# Patient Record
Sex: Female | Born: 1950 | Race: Black or African American | Hispanic: No | Smoking: Never smoker
Health system: Southern US, Community
[De-identification: ages and names within clinical notes are randomized; demographics above are authoritative.]

## PROBLEM LIST (undated history)

## (undated) DIAGNOSIS — T7840XA Allergy, unspecified, initial encounter: Secondary | ICD-10-CM

## (undated) DIAGNOSIS — F329 Major depressive disorder, single episode, unspecified: Secondary | ICD-10-CM

## (undated) DIAGNOSIS — M1990 Unspecified osteoarthritis, unspecified site: Secondary | ICD-10-CM

## (undated) DIAGNOSIS — E785 Hyperlipidemia, unspecified: Secondary | ICD-10-CM

## (undated) DIAGNOSIS — F1910 Other psychoactive substance abuse, uncomplicated: Secondary | ICD-10-CM

## (undated) DIAGNOSIS — I10 Essential (primary) hypertension: Secondary | ICD-10-CM

## (undated) DIAGNOSIS — K219 Gastro-esophageal reflux disease without esophagitis: Secondary | ICD-10-CM

## (undated) DIAGNOSIS — F32A Depression, unspecified: Secondary | ICD-10-CM

## (undated) HISTORY — DX: Other psychoactive substance abuse, uncomplicated: F19.10

## (undated) HISTORY — DX: Hyperlipidemia, unspecified: E78.5

## (undated) HISTORY — DX: Unspecified osteoarthritis, unspecified site: M19.90

## (undated) HISTORY — PX: TONSILLECTOMY: SUR1361

## (undated) HISTORY — DX: Gastro-esophageal reflux disease without esophagitis: K21.9

## (undated) HISTORY — DX: Depression, unspecified: F32.A

## (undated) HISTORY — DX: Allergy, unspecified, initial encounter: T78.40XA

## (undated) HISTORY — DX: Major depressive disorder, single episode, unspecified: F32.9

---

## 1994-12-12 HISTORY — PX: OTHER SURGICAL HISTORY: SHX169

## 2002-07-08 ENCOUNTER — Inpatient Hospital Stay (HOSPITAL_COMMUNITY): Admission: EM | Admit: 2002-07-08 | Discharge: 2002-07-11 | Payer: Self-pay | Admitting: Emergency Medicine

## 2002-07-08 ENCOUNTER — Encounter: Payer: Self-pay | Admitting: Internal Medicine

## 2003-04-10 ENCOUNTER — Encounter: Payer: Self-pay | Admitting: Emergency Medicine

## 2003-04-10 ENCOUNTER — Inpatient Hospital Stay (HOSPITAL_COMMUNITY): Admission: EM | Admit: 2003-04-10 | Discharge: 2003-04-16 | Payer: Self-pay | Admitting: Emergency Medicine

## 2003-05-18 ENCOUNTER — Encounter: Payer: Self-pay | Admitting: Emergency Medicine

## 2003-05-18 ENCOUNTER — Emergency Department (HOSPITAL_COMMUNITY): Admission: EM | Admit: 2003-05-18 | Discharge: 2003-05-18 | Payer: Self-pay | Admitting: Emergency Medicine

## 2003-06-19 ENCOUNTER — Ambulatory Visit (HOSPITAL_COMMUNITY): Admission: RE | Admit: 2003-06-19 | Discharge: 2003-06-19 | Payer: Self-pay | Admitting: Family Medicine

## 2003-06-25 ENCOUNTER — Ambulatory Visit (HOSPITAL_COMMUNITY): Admission: RE | Admit: 2003-06-25 | Discharge: 2003-06-25 | Payer: Self-pay | Admitting: Family Medicine

## 2004-02-26 ENCOUNTER — Emergency Department (HOSPITAL_COMMUNITY): Admission: EM | Admit: 2004-02-26 | Discharge: 2004-02-26 | Payer: Self-pay | Admitting: Internal Medicine

## 2006-06-22 ENCOUNTER — Emergency Department (HOSPITAL_COMMUNITY): Admission: EM | Admit: 2006-06-22 | Discharge: 2006-06-22 | Payer: Self-pay | Admitting: Emergency Medicine

## 2006-12-11 ENCOUNTER — Inpatient Hospital Stay (HOSPITAL_COMMUNITY): Admission: EM | Admit: 2006-12-11 | Discharge: 2006-12-14 | Payer: Self-pay | Admitting: Emergency Medicine

## 2006-12-30 ENCOUNTER — Emergency Department (HOSPITAL_COMMUNITY): Admission: EM | Admit: 2006-12-30 | Discharge: 2006-12-31 | Payer: Self-pay | Admitting: Emergency Medicine

## 2008-04-23 ENCOUNTER — Emergency Department (HOSPITAL_COMMUNITY): Admission: EM | Admit: 2008-04-23 | Discharge: 2008-04-23 | Payer: Self-pay | Admitting: Emergency Medicine

## 2008-10-19 ENCOUNTER — Emergency Department (HOSPITAL_COMMUNITY): Admission: EM | Admit: 2008-10-19 | Discharge: 2008-10-19 | Payer: Self-pay | Admitting: Emergency Medicine

## 2008-11-10 ENCOUNTER — Emergency Department (HOSPITAL_COMMUNITY): Admission: EM | Admit: 2008-11-10 | Discharge: 2008-11-10 | Payer: Self-pay | Admitting: Emergency Medicine

## 2009-07-16 ENCOUNTER — Ambulatory Visit (HOSPITAL_COMMUNITY): Admission: RE | Admit: 2009-07-16 | Discharge: 2009-07-16 | Payer: Self-pay | Admitting: Internal Medicine

## 2009-07-21 ENCOUNTER — Encounter: Payer: Self-pay | Admitting: Internal Medicine

## 2009-07-23 ENCOUNTER — Telehealth (INDEPENDENT_AMBULATORY_CARE_PROVIDER_SITE_OTHER): Payer: Self-pay

## 2009-07-27 ENCOUNTER — Encounter: Payer: Self-pay | Admitting: Internal Medicine

## 2009-07-27 ENCOUNTER — Ambulatory Visit: Payer: Self-pay | Admitting: Internal Medicine

## 2009-07-27 ENCOUNTER — Ambulatory Visit (HOSPITAL_COMMUNITY): Admission: RE | Admit: 2009-07-27 | Discharge: 2009-07-27 | Payer: Self-pay | Admitting: Internal Medicine

## 2009-07-28 ENCOUNTER — Encounter: Payer: Self-pay | Admitting: Internal Medicine

## 2009-10-15 ENCOUNTER — Encounter: Payer: Self-pay | Admitting: Orthopedic Surgery

## 2009-10-15 ENCOUNTER — Ambulatory Visit (HOSPITAL_COMMUNITY): Admission: RE | Admit: 2009-10-15 | Discharge: 2009-10-15 | Payer: Self-pay | Admitting: Internal Medicine

## 2009-11-02 ENCOUNTER — Ambulatory Visit: Payer: Self-pay | Admitting: Orthopedic Surgery

## 2009-11-02 DIAGNOSIS — IMO0002 Reserved for concepts with insufficient information to code with codable children: Secondary | ICD-10-CM | POA: Insufficient documentation

## 2009-11-02 DIAGNOSIS — M1710 Unilateral primary osteoarthritis, unspecified knee: Secondary | ICD-10-CM

## 2009-11-17 ENCOUNTER — Other Ambulatory Visit: Admission: RE | Admit: 2009-11-17 | Discharge: 2009-11-17 | Payer: Self-pay | Admitting: Obstetrics & Gynecology

## 2010-10-14 ENCOUNTER — Ambulatory Visit (HOSPITAL_COMMUNITY): Admission: RE | Admit: 2010-10-14 | Discharge: 2010-10-14 | Payer: Self-pay | Admitting: Internal Medicine

## 2011-01-11 NOTE — Assessment & Plan Note (Signed)
Summary: RT KNEE EVAL/TREAT/HAD XRAY [facility] 10/15/09/REF Miranda Sampson/MEDICAID/CAF   Vital Signs:  Patient profile:   60 year old female Weight:      195 pounds Pulse rate:   78 / minute Resp:     16 per minute  Vitals Entered By: Fuller Canada MD (November 02, 2009 11:17 AM)  Visit Type:  Initial Consult Referring Miranda Sampson:  Dr. Felecia Shelling  CC:  knee pain.  History of Present Illness: I saw Miranda Sampson in the office today for an initial visit.  She is a 60 years old woman with the complaint of:  chief complaint:  right knee pain, referral Miranda Sampson.  pain -duration a year, fell over a year ago.  -location from whole knee to the right hip, no back pain.  -severity [1-10] 9  -worsened by: lying down or walking to the store.  -improved by:  Tylenol, helps some with Vicodin.  -quality: sharp pain.  -context: has swelling and stiffness of the knee, has popping, has catching and locking of the knee, has weakness of the knee. No injections or therapy.  -other symptoms has some numbness right thigh area too. No back surgery or ESI's.  -xrays done & where:  [facility] right knee 10/15/09.  Takes Glucotrol for Diabetes and Vicodin for pain.  Allergies (verified): No Known Drug Allergies  Past History:  Past Medical History: diabetes  Past Surgical History: stomach tumor removed, benign  Family History: Family History of Diabetes  Social History: Patient is widowed.  no job 10th grade education na for smoking, alcohol, and caffeine use  Review of Systems General:  Denies weight loss, weight gain, fever, chills, and fatigue. Cardiac :  Denies chest pain, angina, heart attack, heart failure, poor circulation, blood clots, and phlebitis. Resp:  Denies short of breath, difficulty breathing, COPD, cough, and pneumonia. GI:  Denies nausea, vomiting, diarrhea, constipation, difficulty swallowing, ulcers, GERD, and reflux. GU:  Denies kidney failure, kidney transplant, kidney stones, burning,  poor stream, testicular cancer, blood in urine, and . Neuro:  Denies headache, dizziness, migraines, numbness, weakness, tremor, and unsteady walking. MS:  Denies joint pain, rheumatoid arthritis, joint swelling, gout, bone cancer, osteoporosis, and . Endo:  Denies thyroid disease, goiter, and diabetes. Psych:  Denies depression, mood swings, anxiety, panic attack, bipolar, and schizophrenia. Derm:  Denies eczema, cancer, and itching. EENT:  Denies poor vision, cataracts, glaucoma, poor hearing, vertigo, ears ringing, sinusitis, hoarseness, toothaches, and bleeding gums. Immunology:  Denies seasonal allergies, sinus problems, and allergic to bee stings. Lymphatic:  Denies lymph node cancer and lymph edema.  Physical Exam  Additional Exam:  GEN: well developed and nourished. tall framedl body habitus, grooming and hygiene CDV: normal pulses perfusion color and temperature without swelling or edema Lymph: normal lymph system SKIN: normal without lesions, masses, nodules NEURO/PSYCH: Normal coordination, reflexes and sensation. awake alert and oriented   Ambulation, normal.   PPI:RJJOA knee  Inspection mild malalignment, medial joint line tenderness   ROM flexion 125  MOTOR 5/5  Stability: normal   Negative McMurray sign.  LEFT knee full range of motion, no tenderness, mild malalignment, motor exam. Grade 5 stability, normal. McMurray is negative    Impression & Recommendations:  Problem # 1:  KNEE, ARTHRITIS, DEGEN./OSTEO (CZY-606.30) Assessment New  RIGHT knee:  Verbal consent was obtained. The knee was prepped with alcohol and ethyl chloride. 1 cc of depomedrol 40mg /cc and 4 cc of lidocaine 1% was injected. there were no complications.   . Medial joint space narrowing moderate  consistent with osteoarthritis.  Recommend injections, Naprosyn, patient wanted something for pain as well. She was given Ultracet  Her updated medication list for this problem includes:     Naprosyn 500 Mg Tabs (Naproxen) .Marland Kitchen... 1 by mouth two times a day    Ultracet 37.5-325 Mg Tabs (Tramadol-acetaminophen) .Marland Kitchen... 1 by mouth q 4 as needed pain  Orders: New Patient Level III ([address]) Joint Aspirate / Injection, Large ([address]10) Depo- Medrol 40mg  (J1030)  Medications Added to Medication List This Visit: 1)  Naprosyn 500 Mg Tabs (Naproxen) .Marland Kitchen.. 1 by mouth two times a day 2)  Ultracet 37.5-325 Mg Tabs (Tramadol-acetaminophen) .Marland Kitchen.. 1 by mouth q 4 as needed pain  Patient Instructions: 1)  You have received an injection of cortisone today. You may experience increased pain at the injection site. Apply ice pack to the area for 20 minutes every 2 hours and take 2 xtra strength tylenol every 8 hours. This increased pain will usually resolve in 24 hours. The injection will take effect in 3-10 days.  2)  take arthritis medicne and ultracet for pain  3)  Please schedule a follow-up appointment as needed. Prescriptions: ULTRACET 37.5-325 MG TABS (TRAMADOL-ACETAMINOPHEN) 1 by mouth q 4 as needed pain  #90 x 3   Entered and Authorized by:   Fuller Canada MD   Signed by:   Fuller Canada MD on 11/02/2009   Method used:   Faxed to ...       [facility]* (retail)       [address]       [place], [place]  [address]       Ph: [phone]       Fax: [phone]   RxID:   [id number] NAPROSYN 500 MG TABS (NAPROXEN) 1 by mouth two times a day  #60 x 5   Entered and Authorized by:   Fuller Canada MD   Signed by:   Fuller Canada MD on 11/02/2009   Method used:   Faxed to ...       [facility] (retail)       [address]       [place], [place]  [address]       Ph: [phone]       Fax: [phone]   RxID:   [id number] ULTRACET 37.5-325 MG TABS (TRAMADOL-ACETAMINOPHEN) 1 by mouth q 4 as needed pain  #90 x 3   Entered and Authorized by:   Fuller Canada MD   Signed by:   Fuller Canada MD on  11/02/2009   Method used:   Print then Give to Patient   RxID:   [id number] NAPROSYN 500 MG TABS (NAPROXEN) 1 by mouth two times a day  #60 x 5   Entered and Authorized by:   Fuller Canada MD   Signed by:   Fuller Canada MD on 11/02/2009   Method used:   Print then Give to Patient   RxID:   [id number]

## 2011-01-11 NOTE — Letter (Signed)
Summary: Internal Other Domingo Dimes  Internal Other Domingo Dimes   Imported By: Cloria Spring LPN 16/09/9603 54:09:81  _____________________________________________________________________  External Attachment:    Type:   Image     Comment:   External Document

## 2011-01-11 NOTE — Progress Notes (Signed)
  Phone Note Call from Patient   Caller: Daughter Claris Che Summary of Call: T/C from daughter, Claris Che. She was asking could her Mom be admitted to the hospital night before the TCS because  she is diabetic. I explained to her that Dr. Jena Gauss has decreased her Glucovance to one tablet in the PM day before procedure,  instead of the usual two in the pm. I told her that her Mom could have lots of broths and jello ( she was saying that she wouldn't be eating) and for someone to be with her and make sure she did just that. I explained that lots of diabetics do the TCS and  do not have to be hospitalized. She voiced uncerstanding and agreed to look over all of the instructions and call back if  any further questions. Initial call taken by: Cloria Spring LPN,  July 23, 2009 9:43 AM

## 2011-01-11 NOTE — Letter (Signed)
Summary: History form  History form   Imported By: Jacklynn Ganong 11/03/2009 11:09:30  _____________________________________________________________________  External Attachment:    Type:   Image     Comment:   External Document

## 2011-01-11 NOTE — Letter (Signed)
Summary: Patient Notice, Colon Biopsy Results  [facility]  [address]   [place], [place] [address]   Phone: [phone]  Fax: [phone]       July 28, 2009   Miranda Sampson [address] [place], [place]  [address] 1951-06-11    Dear [name],  I am pleased to inform you that the biopsies taken during your recent colonoscopy did not show any evidence of cancer upon pathologic examination.  Additional information/recommendations:  Please call us if you are having persistent problems or have questions about your condition that have not been fully answered at this time.  Sincerely,    R. Roetta Sessions MD  [facility] Ph: [phone]    Fax: [phone]   Appended Document: Patient Notice, Colon Biopsy Results mailed pt letter

## 2011-03-19 LAB — GLUCOSE, CAPILLARY: Glucose-Capillary: 181 mg/dL — ABNORMAL HIGH (ref 70–99)

## 2011-04-26 NOTE — Op Note (Signed)
NAME:  Miranda Sampson, Miranda Sampson                   ACCOUNT NO.:  [id number]   MEDICAL RECORD NO.:  [id number]          PATIENT TYPE:  AMB   LOCATION:  DAY                           FACILITY:  [facility]   PHYSICIAN:  R. Roetta Sessions, M.D. DATE OF BIRTH:  03-02-1951   DATE OF PROCEDURE:  07/27/2009  DATE OF DISCHARGE:                               OPERATIVE REPORT   PROCEDURE PERFORMED:  Screening colonoscopy.   60 year old lady comes for colorectal cancer screening.  She underwent  attempted colonoscopy in 2004 for iron-deficiency by Dr. Karilyn Cota.  Poor  prep precluded complete examination and barium enema was recommended.  As far as I could tell, that was never done.   [name] also tells me that she had stomach surgery at [facility] back in  1997.  Could find no records corroborating this on [facility] data base.  However, there is mentioned indications for a CT back in 2009 for  abdominal pain.  This lady had colon surgery in 2002 for a tumor with  a blockage.  We have no records.  The colonoscopy is now being done as  screening maneuver.  She has no lower GI tract symptoms.  There ws no  family history of colon cancer or colon polyps as she reports.  Questions answered.  She is agreeable.   PROCEDURE NOTE:  O2 saturation, blood pressure, pulse respirations  monitored throughout entire procedure.   CONSCIOUS SEDATION:  Versed 4 mg IV, Demerol 75 mg IV in divided doses.   INSTRUMENT:  Pentax video chip system.   FINDINGS:  Digital rectal exam revealed no abnormalities.   ENDOSCOPIC FINDINGS:  Colon prep was adequate.  Colon:  Colonic mucosa was surveyed from the rectosigmoid junction all  the way up to a blind end of the colon with an adjacent surgical  anastomosis with small-bowel.  She certainly appeared to have had colon  surgery previously.  The anastomosis appeared normal.  The distal small  bowel appeared entirely normal.  From this level the scope was slowly  and cautiously withdrawn.  All  previously mentioned mucosal surfaces  were again seen and the patient had no colonic mucosal abnormalities.  Scope was pulled down the rectum where a thorough examination of the  rectal mucosa including retroflexed view of anal verge demonstrated a  single diminutive polyp at 15 cm.  This was cold biopsy/removed.  The  patient tolerated the procedure well.   IMPRESSION:  1. Diminutive rectal polyp status post cold biopsy removal.  2. Status post a right colon surgery with normal-appearing anastomosis      with small bowel.  Residual colonic mucosa appeared otherwise      normal.   RECOMMENDATIONS:  Like to retrieve record from [facility] regarding her colon surgery previously.  Follow-up on path from  today.  Further recommendations to follow.      Jonathon Bellows, M.D.  Electronically Signed     RMR/MEDQ  D:  07/27/2009  T:  07/27/2009  Job:  [id number]   cc:   Tesfaye D. Felecia Shelling, MD  Fax: [phone]

## 2011-04-29 NOTE — Group Therapy Note (Signed)
NAME:  Miranda Sampson, Miranda Sampson                   ACCOUNT NO.:  [id number]   MEDICAL RECORD NO.:  [id number]          PATIENT TYPE:  INP   LOCATION:  A303                          FACILITY:  [facility]   PHYSICIAN:  Gardiner Barefoot, MD    DATE OF BIRTH:  1951/03/31   DATE OF PROCEDURE:  12/11/2006  DATE OF DISCHARGE:                                 PROGRESS NOTE   SUBJECTIVE:  Miranda Sampson reports that she has improvement in her flank  pain, and it is feeling much better today.  She reports that she has had  less fever and chills.  Over the last 24 hours, she has been growing out  gram negative rods in her blood and has a positive E. coli in her urine  culture with sensitivities pending.   OBJECTIVE:  VITAL SIGNS:  Temperature 98.6, pulse 75, respirations 20,  blood pressure 109/69.  She is satting 99% on room air.  GENERAL:  The patient is awake, alert and oriented x3 and appears in no  acute distress.  HEENT:  Anicteric.  No thrush.  HEART:  Regular rate and rhythm with no murmurs, rubs or gallops.  LUNGS:  Clear to auscultation bilaterally.  ABDOMEN:  Soft and nontender.  Nondistended.  Positive bowel sounds.  No  hepatosplenomegaly.  No CVA tenderness.  EXTREMITIES:  No edema.   LABORATORY:  White blood cells 6.1 with a hemoglobin of 12.6.  Sodium  135, potassium 3.7, chloride 104, bicarb 26, glucose 256, BUN 12,  creatinine 0.53, calcium 8.3.  Urine culture, E. coli.  Blood culture x2  with gram negative rods.   ASSESSMENT/PLAN:  1. Urinary tract infection:  Patient has bacteremia secondary to      pyelonephritis, likely will be Escherichia coli.  However, will      continue with the patient's present antibiotics, awaiting      sensitivities and taper antibiotics to those.  2. Diabetes:  Will continue with sliding scale insulin.  3. Polysubstance abuse:  Discussed with the patient the need to stop      using cocaine, as the risk of stroke and heart attack are increased      by their use.  Patient  voiced understanding.   DISPOSITION:  Patient will need to sign up for Medicaid to try to help  her get into the medical system and take better care of herself.      Gardiner Barefoot, MD  Electronically Signed    RWC/MEDQ  D:  12/11/2006  T:  12/11/2006  Job:  [id number]

## 2011-04-29 NOTE — Op Note (Signed)
   NAME:  Miranda Sampson, Miranda Sampson                               ACCOUNT NO.:  [id number]   MEDICAL RECORD NO.:  [id number]                   PATIENT TYPE:   LOCATION:                                       FACILITY:   PHYSICIAN:  R. Roetta Sessions, M.D.              DATE OF BIRTH:   DATE OF PROCEDURE:  10/29/2002  DATE OF DISCHARGE:                                 OPERATIVE REPORT   PROCEDURE:  Diagnostic esophagogastroduodenoscopy.   INDICATIONS FOR PROCEDURE:  The patient is a 60 year old lady in the  hospital with microcytic anemia, Hemoccult positive stool and NSAID use and  specks of blood in emesis.  EGD is now being done to further evaluate her  symptoms.  Approach has been discussed with the patient at length.  Potential risks, benefits alternatives have been reviewed, questions  answered.  Please see documentation in the medical record for more  information.   PROCEDURE NOTE:  O2 saturation, blood pressure, pulse, respirations were  monitored throughout the entire procedure.   CONSCIOUS SEDATION:  Demerol 75 mg IV in divided doses, Versed 3 mg IV in  divided doses.   INSTRUMENT USED:  Olympus video chip adult gastroscope.   FINDINGS:  Examination of the tubular esophagus revealed normal esophageal  mucosa.  EG junction was easily traversed.   Stomach:  The gastric cavity insufflated well with air.  Thorough  examination of the gastric mucosa including retroflex view of the proximal  stomach, esophagogastric junction demonstrated some antral erosions and a  small hiatal hernia.  There was no ulcer or infiltrating process.  The  gastric mucosa otherwise appeared normal.  The pylorus was patent and easily  traversed.   Duodenum:  The bulb and second portion appeared normal.   THERAPY/DIAGNOSTIC MANEUVERS PERFORMED:  None.   The patient tolerated the procedure well and was reactive.   ENDOSCOPY IMPRESSION:  1. Normal esophagus.  2. Antral erosions and small hiatal hernia.   Otherwise, normal stomach.  3. Normal D1 and D2.    RECOMMENDATIONS:  1. Continue Protonix.  2. Check H. pylori serologies.  3. I recommend the patient undergo a colonoscopy once over her acute     illness.                                                  Jonathon Bellows, M.D.    RMR/MEDQ  D:  10/29/2002  T:  10/29/2002  Job:  [id number]   cc:   Isidor Holts, M.D.  [address]  [place], [place] [address]  Fax: 1

## 2011-04-29 NOTE — Procedures (Signed)
   NAME:  Miranda Sampson, Miranda Sampson                             ACCOUNT NO.:  [id number]   MEDICAL RECORD NO.:  [id number]                   PATIENT TYPE:  EMS   LOCATION:  ED                                   FACILITY:  [facility]   PHYSICIAN:  Edward L. Juanetta Gosling, M.D.             DATE OF BIRTH:  [birth year]   DATE OF PROCEDURE:  05/18/2003  DATE OF DISCHARGE:  05/18/2003                                EKG INTERPRETATION   TIME:  11:17, 05/18/2003.   The rhythm is sinus rhythm with a rate of about 100.  There are T wave  inversions in V1, V2, and V3, and clinical correlation as to significance is  suggested.  Slightly slow R wave progression across the precordium.   Minimally abnormal electrocardiogram.                                               Edward L. Juanetta Gosling, M.D.    ELH/MEDQ  D:  05/28/2003  T:  05/28/2003  Job:  [id number]

## 2011-04-29 NOTE — Op Note (Signed)
NAME:  MOON, BUDDE                             ACCOUNT NO.:  [id number]   MEDICAL RECORD NO.:  [id number]                   PATIENT TYPE:  INP   LOCATION:  A211                                 FACILITY:  [facility]   PHYSICIAN:  Lionel December, M.D.                 DATE OF BIRTH:  1951/06/27   DATE OF PROCEDURE:  04/14/2003  DATE OF DISCHARGE:                                 OPERATIVE REPORT   PROCEDURE:  Colonoscopy which was incomplete.   ENDOSCOPIST:  Lionel December, M.D.   INDICATIONS:  Ms. Gwinn is a 60 year old African-American female who has iron-  deficiency anemia documented last year, but this has never been evaluated.  At this time she presented with a profound anemia. She underwent  esophagogastroduodenoscopy last week which was negative other than  gastritis.  She is, therefore, undergoing colonoscopy.  She has received 4  units of PRBCs and her hemoglobin is now up to 9.9.  The procedures and  risks were reviewed with the patient and informed consent was obtained.   PREOPERATIVE MEDICATIONS:  Demerol 50 mg IV and Versed 8 mg IV in divided  dose.   INSTRUMENT:  Olympus video system.   FINDINGS:  Procedure performed in endoscopy suite.  The patient's vital  signs and O2 saturation were monitored during the procedure and remained  stable.  The patient was placed in the left lateral recumbent position and  rectal examination was performed.  No abnormality noted on external or  digital exam.   The scope was placed in the rectum and advanced under vision into the  sigmoid colon and beyond.  Preparation was suboptimal.  She still had liquid  as well as some formed stool scattered throughout her colon.  The colon was  very redundant and found a loop in her sigmoid colon which never could be  completely undone or prevented.  Slowly and carefully the scope was passed  into the hepatic flexure; however, never could pass the scope into the  ascending colon.  The endoscope was  withdrawn.  The mucosa was once again  carefully examined.  The parts of the mucosa were seen were normal.  The  rectal mucosa similarly was normal.  The scope was retroflexed to examine  the anorectal junction which was unremarkable.   The endoscope was straightened and withdrawn.  The patient tolerated the  procedure well.   FINAL DIAGNOSES:  1. Examination limited to hepatic flexure.  2. Redundant colon with suboptimal prep, but no abnormality noted in the     parts of the colon that were examined.    RECOMMENDATIONS:  1. Will advance with diet to a diabetic diet.  2. Ferrous sulfate 325 mg p.o. b.i.d.  She will return for a barium enema at     a later date.  The patient requests not to undergo this study in the a.m.  Lionel December, M.D.    NR/MEDQ  D:  04/14/2003  T:  04/15/2003  Job:  [id number]   cc:   Hanley Hays. Dechurch, M.D.  [address]  [place]  [place] [address]  Fax: [phone]

## 2011-04-29 NOTE — H&P (Signed)
NAME:  Miranda Sampson, Miranda Sampson NO.:  [id number]   MEDICAL RECORD NO.:  [id number]                   PATIENT TYPE:  INP   LOCATION:  A211                                 FACILITY:  [facility]   PHYSICIAN:  Gracelyn Nurse, M.D.              DATE OF BIRTH:  [birth year]   DATE OF ADMISSION:  04/10/2003  DATE OF DISCHARGE:                                HISTORY & PHYSICAL   CHIEF COMPLAINT:  Weakness and palpitations.   HISTORY OF PRESENT ILLNESS:  The patient is a 60 year old black female who  presents with weakness and palpitations that started this morning.  She  admits to doing cocaine last night.  She has also been out of her diabetic  medicines for a week and has had polyuria and polydipsia.  She has been  having poor p.o. intake.  She had some nausea and vomiting but no  hematemesis.  She was diagnosed in July 2003 with an H. pylori positive  gastritis but did not followup for treatment.  She also says she takes  aspirin up to three times a day.  She does not complain of any chest pain.   PAST MEDICAL HISTORY:  Noninsulin-dependent diabetes, iron-deficiency  anemia, history of H. pylori positive gastritis - untreated, cocaine abuse,  uterine fibroids.   PAST SURGICAL HISTORY:  No surgical history.   ADMISSION LABORATORIES:  No known drug allergies.   CURRENT MEDICATIONS:  1. Glucotrol 5 mg once daily.  2. Aspirin 325 mg three times daily.  3. Zantac 150 mg p.r.n.   SOCIAL HISTORY:  She is widowed, has three children.  She is unemployed.  She does not smoke.  She drinks alcohol occasionally and she does cocaine.   FAMILY HISTORY:  Mother died at age 34 of an MI.  Father died in his 15s  also of MI.   REVIEW OF SYSTEMS:  As per HPI.  Remainder of systems negative.   PHYSICAL EXAMINATION:  VITAL SIGNS:  Temperature is 96.8, pulse 172,  respirations 38, blood pressure 126/55.  GENERAL:  The patient is a poorly nourished black female in no acute  distress.  HEENT:  Pupils equal, round and reactive to light.  Equal ocular movement  intact.  Oral mucosa is dry.  Oropharynx is clear.  CARDIOVASCULAR:  Regular rate and rhythm at the time I am seeing her with no  murmurs.  LUNGS:  Clear to auscultation.  ABDOMEN:  Soft, nondistended, there is some mild epigastric tenderness.  No  rebound or guarding.  EXTREMITIES:  No edema.  NEUROLOGICAL:  Cranial nerves II-XII grossly intact.  No focal deficits.  SKIN:  Dry with no rash.   ADMITTING LABORATORIES:  Sodium 137, potassium 3.8, chloride 104, CO2 22,  BUN 16, creatinine 0.8, glucose is 454.  White blood cells 10.8, hemoglobin  is 8.1, platelets 481.  CK is 46 and troponin I  is 0.01.  Urine drug screen  is positive for cocaine.  EKG shows sinus tachycardia.   ASSESSMENT AND PLAN:  Problem 1. SINUS TACHYCARDIA.  This is what has caused  the palpitations.  It appears to be secondary to dehydration.  As stated  before she has had hyperglycemia with polyuria and has probably not been  keeping up on her in's and out's.  We will go ahead and admit her and  monitor her.  She has already responded to the IV fluids and heart rate is  now in the 80s.   Problem 2. DEHYDRATION.  This is secondary to the polyuria from  hyperglycemia.  She is responding to the IV fluids.  We will continue that  and also treat her hyperglycemia.   Problem 3. UNCONTROLLED NONINSULIN-DEPENDENT DIABETES SECONDARY TO MEDICAL  NONCOMPLIANCE.  We will give her insulin and restart her oral medicines.   Problem 4. IRON-DEFICIENCY ANEMIA.  Suspect her hemoglobin will go even  lower after hydration from dilution so we will go ahead and transfuse 2  units of packed red blood cells.  She is guaiac negative however, she has  been complaining about dark tarry stools.  She probably does have additional  gastrointestinal bleeding at this time.   Problem 5. HELICOBACTER PYLORI POSITIVE GASTRITIS.  This was diagnosed in  July  2003 by EGD.  She did not follow up for treatment.  Suspect she has  chronic gastrointestinal bleeding exacerbated by her taking aspirin.  We  will go ahead and consult gastroenterology and repeat the EGD, start her on  a proton pump inhibitor, we will hold off treating the Helicobacter pylori  until after the endoscopy.  We will go ahead and transfuse her blood as  above.   Problem 6. COCAINE ABUSE.  I did discuss the risk of this with the patient  as far as cardiac.  She seems to understand and I encouraged her to refrain  from using cocaine in the future.                                               Gracelyn Nurse, M.D.    JDJ/MEDQ  D:  04/10/2003  T:  04/10/2003  Job:  [id number]

## 2011-04-29 NOTE — Op Note (Signed)
NAME:  Miranda Sampson, Miranda Sampson                             ACCOUNT NO.:  [id number]   MEDICAL RECORD NO.:  [id number]                   PATIENT TYPE:  INP   LOCATION:  A211                                 FACILITY:  [facility]   PHYSICIAN:  Lionel December, M.D.                 DATE OF BIRTH:  [birth year]   DATE OF PROCEDURE:  04/11/2003  DATE OF DISCHARGE:                                 OPERATIVE REPORT   PROCEDURE:  Esophagogastroduodenoscopy.   ENDOSCOPIST:  Lionel December, M.D.   INDICATIONS:  [name] is a 60 year old African-American female has microcytic  anemia felt to be due to iron deficiency anemia. This was initially  recognized last year but her evaluation has never been completed because of  noncompliance.  She now presents with history of melena, nausea and  vomiting.  She is undergoing diagnostic esophagogastroduodenoscopy.  She has  received two units of PRBCs and her hemoglobin is now up to 9.1.  The  procedure and risks were reviewed with the patient and informed consent was  obtained.   PREOPERATIVE MEDICATIONS:  Cetacaine spray for oropharyngeal topical  anesthesia, Demerol 25 mg IV and Versed 4 mg IV in divided dose.   INSTRUMENT:  Olympus video system.   FINDINGS:  Procedure performed in endoscopy suite.  The patient's vital  signs and O2 saturation were monitored during the procedure and remained  stable.  The patient was placed in the left lateral recumbent position and  endoscope was passed via the oropharynx without any difficulty into the  esophagus.   ESOPHAGUS:  Mucosa of the esophagus was normal.  The squamocolumnar junction  was wavy with focal erythema but no erosions or ulcers noted.  There was a  small sliding hiatal hernia.   STOMACH:  It was empty and distended very well with insufflation.  The folds  of the proximal stomach were normal.  Examination of the mucosa revealed  antral erythema and granularity but no erosions or ulcers were identified.  The pyloric  channel was patent.  The angularis, fundus, and cardia were  examined by retroflexing the scope and were normal.   DUODENUM:  Examination of the bulb revealed normal mucosa.  The mucosa and  folds in the second and third part of the duodenum were normal.   The endoscope was withdrawn.  The patient tolerated the procedure well.   FINAL DIAGNOSES:  1. Nonerosive antral gastritis secondary to Helicobacter pylori infection     previously diagnosed.  2. Small sliding hiatal hernia with mild changes of reflux esophagitis at     the level of the gastroesophageal junction.  3. No lesion identified to confirm iron-deficiency anemia.    RECOMMENDATIONS:  1. Total colonoscopy on 04/14/2003 if the patient is agreeable.  Will     advance the diet to modified low-carb 2000 calorie diet.  2. H. pylori therapy would be initiated after colonoscopy  is completed.                                               Lionel December, M.D.    NR/MEDQ  D:  04/11/2003  T:  04/11/2003  Job:  [id number]   cc:   Gracelyn Nurse, M.D.  [address]  [place] [address]  Fax: [phone]

## 2011-04-29 NOTE — Consult Note (Signed)
NAME:  Miranda Sampson, Miranda Sampson                             ACCOUNT NO.:  [id number]   MEDICAL RECORD NO.:  [id number]                   PATIENT TYPE:  INP   LOCATION:  A211                                 FACILITY:  [facility]   PHYSICIAN:  Lionel December, M.D.                 DATE OF BIRTH:  16-Dec-1950   DATE OF CONSULTATION:  04/10/2003  DATE OF DISCHARGE:                                   CONSULTATION   REASON FOR CONSULTATION:  Anemia and history of melena.   HISTORY OF PRESENT ILLNESS:  The patient is a 60 year old African-American  female who has not been feeling well for the last couple of weeks.  She has  had nausea and vomiting off and on and also had a few tarry stools.  She has  been having frequent heartburn, dysphagia, and crampy abdominal pain.  This  morning she felt very poorly.  She noted her heart to be beating very fast.  She had chest pain and felt lightheaded.  She, therefore, came to the  emergency room.  She was noted to be in SVT.  She was given IV fluids and  treated, and heart rate has come down.  She was noted to be anemic with a  hemoglobin of 8.1.  Her stool was guaiac-negative.  Her MCV is very low.  She is, therefore, suspected to have intermittent or chronic GI blood loss.  She does take ASA on a p.r.n. basis.   The patient has a history of iron deficiency anemia.  This was initially  diagnosed in July 2003, when she was hospitalized with pyelonephritis.  At  that time her stool was heme-positive.  Her iron studies revealed serum iron  of 15, TIBC of 248, saturation was 6%, serum ferritin was 31.  She underwent  outpatient esophagogastroduodenoscopy by Dr. Jena Gauss in November 2003.  She  was noted to have erosive antral gastritis.  Her H. pylori serology was  positive.  She was advised to return for colonoscopy and also for H. pylori  gastritis to be treated, but she never called or returned for office visit  despite the fact that our office sent her two letters.  She  complains of  anorexia.  She has lost 10 pounds in the last four months.  She denies  hematuria or vaginal bleeding.  She also denies fresh blood per rectum.   MEDICATIONS:  1. At home she is on Glucotrol, Zantac, Tylenol, and ASA.  2. She was just begun on:     A. Protonix 40 mg IV q.24h.     B. MS 2-4 mg q.3h. p.r.n.     C. Phenergan 25 mg q.6h. p.r.n.   PAST MEDICAL HISTORY:  1. She was diagnosed with diabetes mellitus last year.  She tells me that it     has been well controlled.  2. She has right knee arthritis.  3. Last year she was hospitalized with pyelonephritis.  At that time she had     an ultrasound and was noted to have uterine fibroids.  4. History of iron deficiency anemia.  5. Untreated H. pylori gastritis.   ALLERGIES:  None known.   FAMILY HISTORY:  Both parents are deceased.  They died of heart disease in  their 61s.  She has one sister, age 74, who has CA of breast and _______.   SOCIAL HISTORY:  She is presently unemployed.  She is widowed.  She worked  for [employer] for two years, and prior to that she worked for  [employer] for 23 years.  She does not smoke cigarettes, and drinks alcohol very  occasionally.  She is presently living with a friend.  She has three  children.   PHYSICAL EXAMINATION:  GENERAL:  Well-developed, thin African-American  female who is in no acute distress.  VITAL SIGNS:  Pulse 72 per minute, blood pressure 126/55, temperature is  96.8, respiratory rate is 20.  HEENT:  Conjunctivae is pale, sclerae nonicteric.  Oropharyngeal mucosa is  normal.  Few teeth are missing, the rest in satisfactory condition.  NECK:  Without masses or thyromegaly.  CARDIAC:  With regular rhythm.  Normal S1, S2.  No murmur or gallop noted.  LUNGS:  Clear to auscultation.  ABDOMEN:  Flat.  Bowel sounds are normal. Palpation reveals soft abdomen  with mild to moderate mid epigastric tenderness, and mild tenderness is also  noted in the right lower  quadrant.  RECTAL:  Deferred as she had one by Dr. Letitia Libra.  EXTREMITIES:  Thin, but she does not have clubbing or peripheral edema.   LABORATORY DATA:  WBC 10.8, H&H 8.1 and 26.9, MCV is 22.3, platelet count is  481,000.  Her sodium is 137, potassium 3.8, chloride 104, CO2 is 22, glucose  454, BUN 16, creatinine 0.8, calcium 9.6.  Total CPK is 46, CK-MB of 0.07,  troponin level is less than 0.01.  Her INR is 1, PTT is 24.  Total protein  7.1, with albumin of 3.3, total bilirubin is 0.5, AP 96, ALP 22, ALT 12.  Urinalysis tested positive for cocaine.  The patient tells me that she  smoked some yesterday.   ASSESSMENT:  The patient is a 60 year old African-American female who  presents with a two-week history of nausea and vomiting, history of melena,  epigastric and lower abdominal pain, chest pain, as well as postural  symptoms, who was noted to be in supraventricular tachycardia and has  responded to intravenous fluids.  She is quite anemic with a hemoglobin of  8.1 and low MCV.  Suspect her hemoglobin would drop significantly with  hydration.  It appears she may have peptic ulcer disease with intermittent  or chronic blood loss.  She could also have another lesion in her lower  gastrointestinal tract.  She has Helicobacter pylori gastritis which has not  been treated yet and needs to be at some point.   RECOMMENDATIONS:  1. I agree in treating her with IV PPI.  2. I also agree with giving her two units of PRBCs as H&H is expected to     drop significantly posthydration.  3. Diagnostic esophagogastroduodenoscopy to be performed on April 11, 2003.     She will also need colonoscopy which possibly would be performed during     this hospitalization.  Will start her on therapy for H. pylori gastritis     within the next  day or two.   COMMENTS:  I would like to thank Dr. Letitia Libra for the opportunity to  participate in the patient's care.                                               Lionel December, M.D.    NR/MEDQ  D:  04/10/2003  T:  04/11/2003  Job:  [id number]   cc:   [facility]

## 2011-04-29 NOTE — Discharge Summary (Signed)
NAME:  Miranda Sampson, Miranda Sampson                   ACCOUNT NO.:  [id number]   MEDICAL RECORD NO.:  [id number]          PATIENT TYPE:  INP   LOCATION:  A303                          FACILITY:  [facility]   PHYSICIAN:  Marcello Moores, MD   DATE OF BIRTH:  Sep 08, 1951   DATE OF ADMISSION:  12/09/2006  DATE OF DISCHARGE:  LH                               DISCHARGE SUMMARY   PRIMARY CARE PHYSICIAN:  The patient has no primary doctor but she goes  to [facility] for followup.   HISTORY OF PRESENT ILLNESS:  Miranda Sampson is a 60 year old African-American  female patient with a history of diabetes mellitus and a history of  bowel resection for tumor, and she also has a history of PUD.  She was  admitted on December 09, 2006, for acute pyelonephritis after she  presented to the ER with right flank pain, fever and urinalysis showed  high WBC, 21-50, and bacteria.  Blood culture and urine culture was  growing Escherichia coli and she was put on antibiotic, Levaquin.  Today, urinalysis is negative and she has no leukocytosis.  Chemistry is  within normal range.  She has no fever, no flank pain.  There was not  any leukocytosis since admission and the hospital course was smooth and  she responded well.   PHYSICAL EXAMINATION:  VITAL SIGNS:  Her blood pressure is 112/70 and  the pulse rate is 76 and temperature is 98 and respiratory rate is 20.  HEENT:  She has pink conjunctiva, nonicteric sclerae.  CHEST:  She has good air entry bilaterally.  CVS:  S1, S2, well heard and regular.  ABDOMEN:  Bowel sounds are positive and no area of tenderness on deep  palpation.  EXTREMITIES:  There is no edema.  NEUROLOGICAL:  She is alert and oriented.   LABORATORY DATA:  Today blood glucose is 187 and sodium is 142 and  potassium 3.3.  Chloride is 109 and bicarbonate is 27.  BUN and  creatinine 8 and 0.5 respectively.  Here capillary blood sugar was not  high all during her stay in the hospital .  so the  assessment of pyelonephritis which is well improved and diabetes  mellitus, well controlled, and polysubstance abuse educated and she is  ready to be discharged with her home medicine to be followed in  [facility], and she was given Levaquin to be  continued at least tocompete for two weeks, and her diabetic medication,  aspirin .      Marcello Moores, MD  Electronically Signed     MT/MEDQ  D:  12/14/2006  T:  12/14/2006  Job:  [id number]

## 2011-04-29 NOTE — Consult Note (Signed)
[facility]  Patient:    Miranda Sampson, Miranda Sampson Visit Number: [id number] MRN: [id number]          Service Type: Attending:  Roetta Sessions, M.D. Dictated by:   Tana Coast, P.A.-C Proc. Date: 07/09/02   CC:         Renne Musca, M.D.   Consultation Report  DATE OF BIRTH:  1951/03/07  REASON FOR CONSULTATION:  Microcytic anemia and abdominal pain.  HISTORY OF PRESENT ILLNESS:  The patient is a pleasant 60 year old black female admitted with acute pyelonephritis, gastroenteritis, microcytic anemia, and new diagnosis of uncontrolled diabetes mellitus.  We were asked to see the patient regarding microcytic anemia and abdominal pain.  The patient gives history of two to three days of flank and back pain, fever, dysuria prior to admission.  She had a history of 24 hours of nausea, vomiting, and diarrhea which preceded these symptoms as well.  She reportedly "specks of blood" in her emesis and watery diarrhea.  Chronically she has occasional heart burn, denies dysphagia, odynophagia, constipation, or chronic diarrhea.  She complains of suprapubic abdominal pain with this current illness.  She reports using Advil and aspirin on an occasional basis; however over the last couple of weeks, she has used Advil 2 to 3 times a day.  She denies any melena.  No previous history of anemia.  No history of vaginal bleeding.  On admission, she was not to have a hemoglobin of 8.5, hematocrit 26.2, MCV 72.8, WBC 8.7.  Total bilirubin 0.6, alkaline phosphatase 90, SGOT 12, SGPT 12, albumin 2.2.  Urinalysis revealed greater than 1000 glucose, small amount of blood, protein 30, small leukocyte esterase, few squamous, few bacteria, and rbcs 7 to 10.  She was felt to have probable pyelonephritis.  She has since been placed on Levaquin.  Urine culture is pending.  She was feeling some better today.  She has had iron studies which revealed iron of 15, TIBC 248, iron  saturation 6%, and ferritin 31.  Today her hemoglobin was noted to be down to 7.7, hematocrit 23.9.  She was hemoccult positive on rectal examination by myself today.  She has had transvaginal, pelvic, and renal ultrasound which was unremarkable except for multiple uterine fibroids.  Notably, the patient denies any vaginal bleeding.  The patient has never had a colonoscopy.  HOME MEDICATIONS:  Aspirin, Advil p.r.n., using Advil 2 to 3 times daily for the last one to two weeks.  CURRENT MEDICATIONS: 1. Demerol 50 mg q.4h. p.r.n. 2. Reglan 10 mg q.8h. p.r.n. 3. Tylenol 650 mg q.6h. p.r.n. 4. IV fluids 80 cc per hour with 20 mEq Kay-Ciel per liter. 5. Levaquin 500 mg IV q.d. 6. Sliding scale insulin. 7. Glyburide 5 mg q.d. 8. Protonix 40 mg q.d.  ALLERGIES:  No known drug allergies.  PAST MEDICAL HISTORY:  Right knee arthritis.  New diabetes mellitus as mentioned in the HPI.  PAST SURGICAL HISTORY:  None.  FAMILY HISTORY:  Mother and father both are deceased, had history of diabetes mellitus.  No family history of chronic GI illnesses or colorectal cancer.  SOCIAL HISTORY:  She is widowed.  She had three children.  She has never been a smoker, occasionally consumes alcohol.  REVIEW OF SYSTEMS:  Please see HPI for GI and GU.  PHYSICAL EXAMINATION:  VITAL SIGNS:  Height 5 feet 11 inches.  T-max 101.8, currently 98.1.  Pulse 86, respirations 18, blood pressure 126/83.  GENERAL:  A very pleasant, well-nourished, well-developed black  female in no acute distress.  SKIN:  Warm and dry with no jaundice.  HEENT:  Pupils are equal, round and reactive to light.  Conjunctivae pink, sclerae anicteric.  Oropharyngeal mucosa moist and pink with no lesions, erythema, or exudate.  NECK:  No lymphadenopathy, thyromegaly, or carotid bruits.  CHEST:  Lungs clear to auscultation.  CARDIAC:  Regular rate and rhythm.  Normal S1 and S2.   No murmurs, rubs, or gallops.  ABDOMEN:   Positive bowel sounds.  Soft, nondistended.  Mild suprapubic tenderness to deep palpation.  No organomegaly or masses.  No rebound tenderness or guarding.  RECTAL:  Examination reveals adequate sphincter tone.  No masses in the rectal vault.  Brown stool is hemoccult positive.  EXTREMITIES:  No cyanosis or edema.  LABORATORY DATA:  As mentioned in the HPI.  In addition, sodium 138, potassium 3.7, BUN 5, creatinine 0.6.  IMPRESSION:  The patient is a pleasant 60 year old female with history of microcytic anemia in the setting of heme-positive stool, nonsteroidal anti-inflammatory use, newly diagnosed diabetes mellitus, and acute pyelonephritis.  She reports "specks of blood" in both emesis and watery diarrhea a few days ago.  Differential diagnoses are multiple etiologies including peptic ulcer disease, complicated gastroesophageal reflux disease, nonsteroidal anti-inflammatory induced mucosal injury, polyps, arteriovenous malformations, hemorrhoids, or even cancer.  She appears to have a chronic slow gastrointestinal bleed given the microcytic anemia; however, she may have an acute process overlapping with reported hematemesis and bright red blood per rectum.  The patient is hemodynamically stable.  She did have a slight drop in her hemoglobin and hematocrit; however, this is suspected to be secondary to rehydration.  Suspect her abdominal pain to be solely from current urinary tract infection or pyelonephritis which notably has improved since antibiotics began.  SUGGESTIONS: 1. EGD and colonoscopy when feasible.  Would like the patient to be improved    from acute illness.  Will check hemoglobin and hematocrit in the morning. 2. Continue PPI therapy. 3. Avoid NSAIDs.  Would like to thank Dr. Josefine Class for allowing [place] to take part in the care of this patient. Dictated by:   Tana Coast, P.A.-C Attending:  Roetta Sessions, M.D. DD:  07/09/02  TD:  07/09/02 Job: [id number]

## 2011-04-29 NOTE — Discharge Summary (Signed)
NAME:  Miranda Sampson, Miranda Sampson                             ACCOUNT NO.:  [id number]   MEDICAL RECORD NO.:  [id number]                   PATIENT TYPE:  INP   LOCATION:  A306                                 FACILITY:  [facility]   PHYSICIAN:  Hanley Hays. Dechurch, M.D.           DATE OF BIRTH:  [birth year]   DATE OF ADMISSION:  07/08/2002  DATE OF DISCHARGE:  07/11/2002                                 DISCHARGE SUMMARY   DIAGNOSES:  1. Urinary tract infection.  The organism is not known.  2. Iron-deficiency anemia, chronic.  3. Acute gastrointestinal bleed with erosive gastritis.  4. Helicobacter pylori.  The patient not treated, as results not available     until post discharge.  5. Diabetes mellitus, new diagnosis.  6. Uterine fibroids.  7. Hypokalemia, resolved.  8. Hypoalbuminemia, prealbumin 7.2.   DISPOSITION:  The patient discharged to home.   FOLLOW UP:  Followup is arranged with Dr. Sudie Bailey in 1-2 weeks and Dr.  Karilyn Cota in 4-6 weeks for outpatient colonoscopy.   MEDICATIONS:  1. Levaquin 500 mg to complete a 10-day course.  2. Glucotrol XL 5 mg daily with breakfast.  3. Protonix 40 mg daily for stomach.  4. The patient instructed not to take aspirin, Advil, Motrin, or Aleve.  5. Tylenol as needed for pain.   PROCEDURES:  Endoscopy and transfusion of 2 units of packed red cells.  The  patient did receive diabetes education during her hospital stay.   HOSPITAL COURSE:  The patient is a 60 year old African American female who  presented to the emergency room complaining of nausea, vomiting, chills, and  back pain.  She had a positive urinalysis.  Unfortunately, a culture was  never obtained.  She defervesced on Levaquin.  The patient was also noted to  be hyperglycemic at the time of admission and glucoses in the 300s.  She was  also hypokalemic but had been having diarrhea as well.  The patient's  diarrhea spontaneously improved.  She was treated with IV fluids and bowel  rest, as well  as Protonix, and she also noted some nausea and heme-positive  stools were noted.  Hemoglobin was 8.5 at the time of presentation, fell to  a low of 7.7 with hydration.  She received 2 units of packed red cells.  MCV  was noted to be 78 and albumin was 2.2.  Iron studies subsequently revealed  total iron of 15 with 6% saturation.  Prealbumin at 7.2.  The patient did  have a CT of her abdomen done to assess for urolithiasis, which only  revealed uterine fibroids as the only significant finding.  She improved.  She received diabetes teaching during the hospital stay.  She chose to  follow up with Dr. Sudie Bailey and arrangements were made.  It should be noted  that the patient had been taking Advil on a regular basis and she was  cautioned to  avoid this, given her erosive gastritis.  Again, the patient  was not treated for Helicobacter  pylori and this will need to be followed up as an outpatient.  Her symptoms  are markedly improved and the patient was provided with some Nexium samples  at the time of discharge, hence not given a prescription, as she had a  followup arranged with her primary care physician in 1-2 weeks.                                                 Hanley Hays Josefine Class, M.D.    FED/MEDQ  D:  08/29/2002  T:  09/02/2002  Job:  [id number]   cc:   Mila Homer. Sudie Bailey, M.D.  [address] [place], [place] [address]  Fax: [phone]

## 2011-04-29 NOTE — Discharge Summary (Signed)
NAME:  Miranda Sampson, Miranda Sampson                   ACCOUNT NO.:  [id number]   MEDICAL RECORD NO.:  [id number]          PATIENT TYPE:  INP   LOCATION:  A303                          FACILITY:  [facility]   PHYSICIAN:  Marcello Moores, MD   DATE OF BIRTH:  26-Jan-1951   DATE OF ADMISSION:  12/11/2006  DATE OF DISCHARGE:  01/03/2008LH                               DISCHARGE SUMMARY   addendum.   HISTORY:  The patient has no primary doctor, but she goes to [facility] for her follow up and her diabetes management.   Miranda Sampson is 60 year old African American female patient who was admitted  on 12/09/2006 for acute pyelonephritis after she presented to the  emergency room with right flank pain, fever, and urinalysis showed with  high white blood cells 21-50 and bacteria and the blood culture was  growing Escherichia coli and urine culture also growing Escherichia coli  but turned out to be negative after treatment and leucocytosis resolved.   She was put on antibiotic -Levaquin. and given levaquin prescription  during discharge. but she called from pharmacy stating she can not  afford it and i changed  itto cipro on the phone.      Marcello Moores, MD  Electronically Signed     MT/MEDQ  D:  12/14/2006  T:  12/14/2006  Job:  [id number]

## 2011-04-29 NOTE — H&P (Signed)
NAME:  Miranda Sampson, Miranda Sampson                             ACCOUNT NO.:  [id number]   MEDICAL RECORD NO.:  [id number]                   PATIENT TYPE:  INP   LOCATION:  A306                                 FACILITY:  [facility]   PHYSICIAN:  Isidor Holts                     DATE OF BIRTH:  1951/02/03   DATE OF ADMISSION:  07/07/2002  DATE OF DISCHARGE:                                HISTORY & PHYSICAL   CHIEF COMPLAINT:  Nausea and vomiting for three days, fever and chills, back  pain on and off, dysuria, urinary frequency.   HISTORY OF PRESENT ILLNESS:  This is a 60 year old African-American female  was quite well until three days ago when the above symptoms started.  She  experienced diarrhea which was watery with "specks of blood" which has now  stopped since July 06, 2002.  She vomited just once on the day of admission.  Has been taking aspirin for the above mentioned back pain with only  transient relief.  She eventually got her friend to bring her to the  emergency room at [facility].   PAST MEDICAL HISTORY:  Unremarkable.   PAST SURGICAL HISTORY:  Unremarkable.   MEDICATIONS:  Nothing regular.   ALLERGIES:  No known drug allergies.   SOCIAL HISTORY:  Single.  Has three offspring whom are alive and well.  Nonsmoker.  Drinks alcohol only occasionally.  Denies history of alcohol or  drug abuse.   PHYSICAL EXAMINATION:  VITAL SIGNS:  Temperature 99.4, pulse 87,  respirations 16, blood pressure 95/57.  GENERAL:  Does not appear to be in no acute distress.  Hydration status  fair.  Communicative.  Not short of breath at rest.  HEENT:  No clinical pallor.  No jaundice.  Throat is clear.  NECK:  Supple.  There was no palpable lymphadenopathy.  JVP was not seen.  There was no palpable goiter.  CHEST:  Lungs were clear to auscultation.  No wheezes.  No crackles.  Heart  sounds are regular.  1 and 2 are heard.  There were no murmurs.  ABDOMEN:  Mild bilateral low quadrant and  suprapubic tenderness.  No  guarding or rebound.  There was bilateral renal angle point tenderness.  EXTREMITIES:  Lower extremity examination was unremarkable.  RECTAL:  Rectal examination by the ER M.D. was guaiac negative.   INVESTIGATIONS:  Chemistry:  Sodium 136, potassium 2.8, chloride 98,  carbonate 41, BUN 5, creatinine 0.6, glucose 326.  LFTs normal.  CBC:  WBC  8.7, hemoglobin 8.5, hematocrit 26.2, platelets 294,000, MCV 72.8, RDW 16.2.  UA cloudy, glucose 1000, protein 30, wbc numerous, rbc 7-10.   ASSESSMENT/PLAN:  Admit general medical floor.  1. Acute pyelonephritis.  Do blood cultures.  Commence intravenous fluids,     give six of antipyretics.  Commence IV Levaquin and do renal ultrasound  to rule out urolithiasis.  2. Gastroenteritis.  Her tests are resolved.  Will monitor.  3. Newly diagnosed uncontrolled diabetes mellitus.  Do HBA1C.  Commence     American Diabetic Association diet and cover with sliding scale insulin     for now.  4. Moderate microcytic anemia.  Do iron studies, i.e., ferritin, iron, TIBC.     Get uterine ultrasound to rule out fibroids.  In view of history of     "specks of blood" in the diarrheal stool, will need gastrointestinal work-     up as soon as feasible.  5. Hypokalemia.  Will repeat accordingly.  Further management will depend on     clinical course.                                               Isidor Holts    CO/MEDQ  D:  07/08/2002  T:  07/10/2002  Job:  [id number]

## 2011-04-29 NOTE — H&P (Signed)
NAME:  Miranda, Sampson NO.:  [id number]   MEDICAL RECORD NO.:  [id number]          PATIENT TYPE:  OBV   LOCATION:  A303                          FACILITY:  [facility]   PHYSICIAN:  Lonia Blood, M.D.       DATE OF BIRTH:  Feb 13, 1951   DATE OF ADMISSION:  12/09/2006  DATE OF DISCHARGE:  LH                              HISTORY & PHYSICAL   The patient's primary care physician is with [facility]  [facility].   CHIEF COMPLAINTS:  Right flank pain.   HISTORY OF PRESENT ILLNESS:  Miranda Sampson is a 60 year old woman with known  uncontrolled diabetes mellitus type 2, cocaine abuse and medical  noncompliance who was brought to the emergency room by her daughter  after she was complaining of severe right-sided flank pain.  The patient  reports that for the past 48 hours she has been having dysuria, foul-  smelling urine and urinary frequency but she ignored the complaints.  Tonight, she had significant pain in the right side of her back  radiating into the front associated with some shaking fever and chills.   PAST MEDICAL HISTORY:  1. Resection of a large bowel tumor about 2 years ago at [facility].  2. Uncontrolled diabetes mellitus.  3. H. pylori positive gastritis.   HOME MEDICATIONS:  None.   SOCIAL HISTORY:  The patient does not smoke cigarettes.  She drinks  alcohol occasionally.  She uses cocaine on a regular basis.  She is  single, lives with her daughter and she is currently unemployed.   FAMILY HISTORY:  The patient's mother died at the age of 55 with a heart  attack.  The patient's father died at age 27 with a heart attack.  The  patient has one sister living with leukemia.  She also has a half-sister  and a half-brother as well as three children that are all healthy.   REVIEW OF SYSTEMS:  As per HPI.  Also positive for occasional chest  pains when the patient is injecting cocaine.  All other systems are  negative.   PHYSICAL EXAMINATION:  Upon admission, temperature is 103.1, blood  pressure 202/79, pulse 106, respirations 22, saturation 98% on room air.  GENERAL APPEARANCE:  Well-developed, well-nourished African American  woman in no acute distress.  Alert and oriented to place, person and  time; calm and cooperative.  Head is normocephalic, atraumatic.  Eyes:  Pupils equal, round, react to light and accommodation.  Extraocular  movements intact.  Conjunctivae pink.  Sclerae anicteric.  Throat clear.  Neck: Supple without JVD.  No carotid bruits.  CHEST:  Clear to auscultation bilaterally without wheezes, rhonchi or  crackles.  There is regular rate and rhythm without murmurs, rubs or  gallop.  ABDOMEN:  Soft, nontender and nondistended.  Bowel sounds are present.  GENITOURINARY EXAM:  Shows appreciable right CVA tenderness.  LOWER EXTREMITIES:  No edema.  MUSCULOSKELETAL:  Intact muscle bulk, tone.  SKIN:  Warm, dry without any suspicious rashes.   LABORATORY  VALUES:  At time of admission, sodium is 132, potassium 3.6,  chloride 100, bicarb 22, BUN 11, creatinine 0.6, glucose 470.  White  blood cell count 7.6, hemoglobin 14.5, platelet count 226.  Urinalysis  is positive for glucose, moderate hemoglobin, trace ketones, 21-50 white  blood cells and few bacteria.   ASSESSMENT/PLAN:  1. Acute pyelonephritis in a patient with uncontrolled diabetes      mellitus type 2:  We will admit the patient for 23-hour      observation, place her on intravenous fluids, sliding-scale insulin      as well as empiric antibiotics.  Urine culture and blood cultures      have already been obtained in the emergency room.  The patient will      also receive education for her diabetes and she will be started      back on glipizide 5 mg daily.  2. In regards to the patient's cocaine abuse:  She will be treated      with labetalol for blood pressure control and she will receive      counseling.       Lonia Blood, M.D.  Electronically Signed     SL/MEDQ  D:  12/09/2006  T:  12/09/2006  Job:  [id number]

## 2011-04-29 NOTE — Discharge Summary (Signed)
NAME:  Miranda Sampson, Miranda Sampson                             ACCOUNT NO.:  [id number]   MEDICAL RECORD NO.:  [id number]                   PATIENT TYPE:  INP   LOCATION:  A211                                 FACILITY:  [facility]   PHYSICIAN:  Miranda Hays. Dechurch, M.D.           DATE OF BIRTH:  [birth year]   DATE OF ADMISSION:  04/10/2003  DATE OF DISCHARGE:  04/16/2003                                 DISCHARGE SUMMARY   DIAGNOSES:  1. Iron-deficiency anemia, multifactorial.  2. Nonerosive gastritis on esophagogastroduodenoscopy.  3. Inadequate colonoscopy only to hepatic flexure with no gross lesions,     scheduled for barium enema on 05/01/2003 with followup by Dr. Karilyn Cota.  4. Anemia, requiring transfusion, status post 2 units of packed red cells.  5. Diabetes mellitus, uncontrolled secondary to noncompliance with therapy.  6. Palpitations secondary to volume depletion, resolved.  7. Substance abuse with positive cocaine on toxicology screen.  8. History of Helicobacter pylori-positive gastritis, had not been treated,     currently on Prevpac.  9. History of uterine fibroids.  10.      Nonsteroidal abuse.   CONDITION:  Patient improved.   DISPOSITION:  Patient discharged to home.   DISCHARGE MEDICATIONS:  1. Prevpac one p.o. b.i.d. for two weeks.  2. Glucophage 500 mg b.i.d. with food.  3. Iron supplement b.i.d.   DISCHARGE INSTRUCTIONS:  1. Patient is to obtain a Prevpac from Dr. Patty Sermons office today.  2. No aspirin, Advil, ibuprofen, Aleve, or other nonsteroidals.  3. Tylenol may be used for pain.   FOLLOW UP:  1. Patient is to follow up with Dr. Karilyn Cota post barium enema; they will call     for appointment.  2. [facility] for [facility] care, 04/17/2003, 11 a.m.  3. CBC on 05/01/2003.  4. Barium enema, 05/01/2003.   HOSPITAL COURSE:  Patient is a 60 year old African-American female who was  actually admitted in July '03 with erosive gastritis, iron-deficiency  anemia,  requiring transfusion, and a new diagnosis of diabetes mellitus.  Biopsies at that time did reveal H. Pylori; however, the patient did not  follow up with the primary care physician and therefore was not treated.  The patient was lost to followup, apparently incarcerated, and was released  from prison in January.  Since that time, she has not had her medications.  She was using aspirin on a regular basis and apparently on the evening of  presentation had had some cocaine.  She presented to the emergency room with  palpitations, was noted to be hypotensive and volume depleted.  Hemoglobin  at that time was 8.1, and hemoglobin decreased to a low of 7.9 with  hydration, and she received 2 units of packed red cells.  Glucose at time of  presentation was 454; the patient was placed on Glucophage and sliding-scale  insulin.  She has required no sliding-scale insulin over the last several  days, and her glucoses are in the 170 to 195 range.  The patient underwent  endoscopy, which, again, revealed nonerosive antral gastritis, thought  secondary to her Helicobacter pylori, but no frank bleeding lesion.  She  underwent colonoscopy, which revealed redundant colon with suboptimal prep  and was only examined to the hepatic flexure.  The patient is scheduled for  a barium enema to complete the evaluation as an outpatient.  The findings  were discussed with the patient, who seemed to have reasonable  understanding.  The need for ongoing treatment for diabetes was stressed as  well as she was counseled on substance abuse.  The arrangements were made  for discharge followup and explained to the patient.  She was provided with  scripts for Glucophage and iron sulfate.  Prevpac is being supplied by Dr.  Patty Sermons clinic.  At the time of discharge, she is awake, alert, in no  distress.  Blood pressure is 120/70, pulse is 74 and regular, respirations  are unlabored.  Lungs are clear.  Heart regular.  Abdomen  protuberant, soft,  nontender.  Extremities without clubbing or cyanosis.  No edema is noted.   ASSESSMENT AND PLAN:  As noted above.   CONDITION:  Patient is stable at the time of discharge.                                               Miranda Sampson, M.D.    FED/MEDQ  D:  04/16/2003  T:  04/16/2003  Job:  [id number]   cc:   [facility]  [place], [place]   Lionel December, M.D.  [address]  [place]  [place] [address]  Fax: [phone]

## 2011-04-29 NOTE — Group Therapy Note (Signed)
NAME:  Miranda Sampson, Miranda Sampson                   ACCOUNT NO.:  [id number]   MEDICAL RECORD NO.:  [id number]          PATIENT TYPE:  OBV   LOCATION:  A303                          FACILITY:  [facility]   PHYSICIAN:  Margaretmary Dys, M.D.DATE OF BIRTH:  11-Jul-1951   DATE OF PROCEDURE:  12/10/2006  DATE OF DISCHARGE:                                 PROGRESS NOTE   SUBJECTIVE:  She continues to have some right flank pain, although she  states it is slightly better.  She has some fevers this morning with  chills, and had blood cultures growing Gram-negative rods, for which I  was called.   She denies any nausea or vomiting, she has a good appetite.  The patient  reports not taking her diabetic medications because she could not afford  it.  She is not on disability and does not have Medicaid.   OBJECTIVE:  Conscious, alert, comfortable, not in acute distress.  VITAL SIGNS:  Blood pressure 155/100, pulse 99, respirations 20,  temperature 102.3 degree F.  T-max was 103.1.  Oxygen saturation was 96%  on room air.  Blood sugars range between 198-313.  HEENT:  Normocephalic, atraumatic.  Oral mucosa was moist.  No exudate.  NECK:  Supple.  No JVD.  LUNGS:  Clear and good air entry bilaterally.  HEART:  S1, S2 regular.  No S3, S4 gallops or rubs.  ABDOMEN:  The patient has some tenderness with positive right renal  angle tenderness.  EXTREMITIES:  No edema.   LABORATORY/DIAGNOSTIC DATA:  White blood cell count was 8.6, hemoglobin  13, hematocrit 38, platelet count was 191,000 with neutrophils of 85%,  sodium 136, potassium 3.2, chloride of 106, CO2 24, glucose 184, BUN 10,  creatinine was 0.5, calcium was 8.6.  Blood cultures 2/2 bottles are  growing Gram-negative rods.   ASSESSMENT AND PLAN:  1. Urinary tract infection with right acute pyelonephritis.  The      patient has bacteremia now with Gram-negative rods in both specimen      bottles.  The patient was put on Levaquin 750 mg on admission, and  I will add Zosyn until we receive the final culture and      sensitivity.  I suspect this is probably Escherichia coli sepsis.      The double coverage in case this is pseudomonas sepsis.  2. Continue sliding scale insulin.  I do not feel like oral      hypoglycemic agents will be continued at this time.  3. Will replace her potassium.  4. Will also request social services see her as the patient has      significant trouble obtaining her medications.  5. Also discussed briefly smoking and cocaine use/abstinence.     Margaretmary Dys, M.D.  Electronically Signed    AM/MEDQ  D:  12/10/2006  T:  12/10/2006  Job:  [id number]

## 2011-07-18 ENCOUNTER — Ambulatory Visit: Payer: Self-pay | Admitting: Family Medicine

## 2011-08-05 ENCOUNTER — Ambulatory Visit: Payer: Self-pay | Admitting: Family Medicine

## 2011-08-16 ENCOUNTER — Encounter: Payer: Self-pay | Admitting: Family Medicine

## 2011-08-16 ENCOUNTER — Ambulatory Visit (INDEPENDENT_AMBULATORY_CARE_PROVIDER_SITE_OTHER): Payer: Medicaid Other | Admitting: Family Medicine

## 2011-08-16 VITALS — BP 130/80 | HR 79 | Resp 16 | Ht 71.0 in | Wt 195.0 lb

## 2011-08-16 DIAGNOSIS — IMO0002 Reserved for concepts with insufficient information to code with codable children: Secondary | ICD-10-CM

## 2011-08-16 DIAGNOSIS — J029 Acute pharyngitis, unspecified: Secondary | ICD-10-CM

## 2011-08-16 DIAGNOSIS — M1710 Unilateral primary osteoarthritis, unspecified knee: Secondary | ICD-10-CM

## 2011-08-16 DIAGNOSIS — E119 Type 2 diabetes mellitus without complications: Secondary | ICD-10-CM

## 2011-08-16 DIAGNOSIS — E785 Hyperlipidemia, unspecified: Secondary | ICD-10-CM | POA: Insufficient documentation

## 2011-08-16 MED ORDER — NAPROXEN 500 MG PO TABS: 500.0000 mg | ORAL_TABLET | Freq: Two times a day (BID) | ORAL | Status: DC

## 2011-08-16 MED ORDER — HYDROCODONE-ACETAMINOPHEN 5-500 MG PO TABS: ORAL_TABLET | ORAL | Status: DC

## 2011-08-16 NOTE — Assessment & Plan Note (Signed)
Continue current medications, will obtain labs

## 2011-08-16 NOTE — Assessment & Plan Note (Signed)
Check lipids 

## 2011-08-16 NOTE — Assessment & Plan Note (Signed)
No antibiotics or medications needed. Supportive care

## 2011-08-16 NOTE — Progress Notes (Signed)
  Subjective:    Patient ID: Miranda Sampson, female    DOB: 10/18/1951, 60 y.o.   MRN: [id number]  HPI  Pt here to establish care, previously seen by Dr. Felecia Shelling , medication and history review      Pain- Has pain in both knees R > L,also has pain in right hip. Has history of arthritis- has pain in both knees  Also asking for antibiotics for a sore throat  Diabetes-- approx 6 years, has been on Glucovance, takes blood sugars at home - morning sugars run 80-139, evening sugar- takes before dinner run 140's, she was told she has diabetic changes in her eyes, has polyuria  Heartburn- takes prilosec once a day   Sore throat- this past weekend, +sick contacts, no fever, no chills,no cough   Review of Systems   GEN- denies fatigue, fever, weight loss,weakness, recent illness HEENT- denies eye drainage, change in vision, nasal discharge, CVS- denies chest pain, palpitations RESP- denies SOB, cough, wheeze ABD- denies N/V, change in stools, abd pain MSK- denies joint pain, muscle aches, injury       Objective:   Physical Exam  GEN- NAD, alert and oriented x3 HEENT- PERRL, EOMI, non injected sclera, pink conjunctiva, MMM, oropharynx clear, non injected oropharynx Neck- Supple, no thryomegaly Nodes- no cervical or submandibular LAD CVS- RRR, no murmur RESP-CTAB EXT- No edema MSK- Hip- normal IR/ER , neg fabers,  Right knee- No effusion, no boney abnormality, pain with palpation over lateral aspects of knee, mild crepitus, ligaments in tact, patella tracks normally  Pulses- Radial, DP- 2+         Assessment & Plan:

## 2011-08-16 NOTE — Assessment & Plan Note (Signed)
History of osteoarthritis. I will start her on anti-inflammatories at this time. Her Vicodin was refilled I have also given her a few extra pills to take twice a day if needed. She will return in 1-2 weeks for injection if she still has pain

## 2011-08-16 NOTE — Patient Instructions (Signed)
Schedule a visit for your knee injection in the next 1-2 weeks Use the pain medication as prescribed  Schedule a different visit from your knee approx 1 month - to discuss your sleep and mood Please get your blood work prior to the visit to discuss your knee  Continue all other medications  Take the anti-inflammatory with food

## 2011-08-30 ENCOUNTER — Ambulatory Visit: Payer: Medicaid Other | Admitting: Family Medicine

## 2011-09-09 ENCOUNTER — Ambulatory Visit: Payer: Medicaid Other | Admitting: Family Medicine

## 2011-09-13 LAB — COMPREHENSIVE METABOLIC PANEL
ALT: 14
AST: 13
Albumin: 3.4 — ABNORMAL LOW
Alkaline Phosphatase: 110
BUN: 20
CO2: 29
Calcium: 9.1
Chloride: 105
Creatinine, Ser: 0.8
GFR calc Af Amer: >60
GFR calc non Af Amer: >60
Glucose, Bld: 303 — ABNORMAL HIGH
Potassium: 4.5
Sodium: 141
Total Bilirubin: 0.7
Total Protein: 7.2

## 2011-09-13 LAB — DIFFERENTIAL
Basophils Absolute: 0
Basophils Relative: 1
Eosinophils Absolute: 0.2
Eosinophils Relative: 3
Lymphocytes Relative: 25
Lymphs Abs: 1.4
Monocytes Absolute: 0.4
Monocytes Relative: 8
Neutro Abs: 3.6
Neutrophils Relative %: 64

## 2011-09-13 LAB — URINALYSIS, ROUTINE W REFLEX MICROSCOPIC
Bilirubin Urine: NEGATIVE
Glucose, UA: >1000 — AB
Hgb urine dipstick: NEGATIVE
Ketones, ur: NEGATIVE
Leukocytes, UA: NEGATIVE
Nitrite: NEGATIVE
Protein, ur: NEGATIVE
Specific Gravity, Urine: 1.02
Urobilinogen, UA: 0.2
pH: 6.5

## 2011-09-13 LAB — CBC
HCT: 37.5
Hemoglobin: 12.9
MCHC: 34.2
MCV: 99.8
Platelets: 206
RBC: 3.76 — ABNORMAL LOW
RDW: 13.1
WBC: 5.6

## 2011-09-13 LAB — LIPASE, BLOOD: Lipase: 20

## 2011-09-13 LAB — RAPID URINE DRUG SCREEN, HOSP PERFORMED
Amphetamines: NOT DETECTED
Barbiturates: NOT DETECTED
Benzodiazepines: NOT DETECTED
Cocaine: POSITIVE — AB
Opiates: NOT DETECTED
Tetrahydrocannabinol: NOT DETECTED

## 2011-09-13 LAB — URINE MICROSCOPIC-ADD ON

## 2011-09-16 ENCOUNTER — Encounter: Payer: Self-pay | Admitting: Family Medicine

## 2011-09-16 ENCOUNTER — Ambulatory Visit (INDEPENDENT_AMBULATORY_CARE_PROVIDER_SITE_OTHER): Payer: Medicaid Other | Admitting: Family Medicine

## 2011-09-16 DIAGNOSIS — E119 Type 2 diabetes mellitus without complications: Secondary | ICD-10-CM

## 2011-09-16 DIAGNOSIS — Z20828 Contact with and (suspected) exposure to other viral communicable diseases: Secondary | ICD-10-CM

## 2011-09-16 DIAGNOSIS — IMO0001 Reserved for inherently not codable concepts without codable children: Secondary | ICD-10-CM

## 2011-09-16 DIAGNOSIS — IMO0002 Reserved for concepts with insufficient information to code with codable children: Secondary | ICD-10-CM

## 2011-09-16 DIAGNOSIS — R030 Elevated blood-pressure reading, without diagnosis of hypertension: Secondary | ICD-10-CM

## 2011-09-16 DIAGNOSIS — M1710 Unilateral primary osteoarthritis, unspecified knee: Secondary | ICD-10-CM

## 2011-09-16 DIAGNOSIS — F39 Unspecified mood [affective] disorder: Secondary | ICD-10-CM | POA: Insufficient documentation

## 2011-09-16 MED ORDER — NAPROXEN 500 MG PO TABS: 500.0000 mg | ORAL_TABLET | Freq: Two times a day (BID) | ORAL | Status: DC

## 2011-09-16 MED ORDER — HYDROCODONE-ACETAMINOPHEN 5-500 MG PO TABS: ORAL_TABLET | ORAL | Status: DC

## 2011-09-16 MED ORDER — CETIRIZINE HCL 10 MG PO TABS: 10.0000 mg | ORAL_TABLET | Freq: Every day | ORAL | Status: DC

## 2011-09-16 MED ORDER — OMEPRAZOLE 40 MG PO CPDR: 40.0000 mg | DELAYED_RELEASE_CAPSULE | Freq: Every day | ORAL | Status: DC

## 2011-09-16 MED ORDER — GLYBURIDE-METFORMIN 5-500 MG PO TABS: 2.0000 | ORAL_TABLET | Freq: Two times a day (BID) | ORAL | Status: DC

## 2011-09-16 NOTE — Assessment & Plan Note (Signed)
Pt to get A1C, will review labs, add low dose ACEI Will send to podiatry for nail trim and foot inspection Continue glucovance Given refill on test strips

## 2011-09-16 NOTE — Assessment & Plan Note (Addendum)
Pt declined knee injection, somehow she did not get the anti-inflammatory meds, sent again. Low dose Vicodin refilled Encouraged some activity- pt to contact [employer] about water aerobics

## 2011-09-16 NOTE — Progress Notes (Signed)
  Subjective:    Patient ID: Miranda Sampson, female    DOB: 1951/02/28, 60 y.o.   MRN: [id number]  HPI   DId not get blood work, will have fasting labs done this months     DM- has been out of strips for past few days, AM fasting in 120's, in the evening 130's before meals, no hypoglycemic episodes, had a few times greater than 200     ROS- no polyuria, polydipsia, occ numbness in feet   Arthritis- right knee continues to be a problem, did not get the naprosyn asked this to be refilled  , Needs refill on hydrocodone  ROS- no recent swelling of right knee, no locking, +joint pain Needs refill on other medications   Mood- feels overall her mood is okay, denies feeling depressed but feels more toward because she is nothing to do at home. She does not drive and her arthritis keeps her from walking long distances. She is at her home by herself and does not have any activities. She typically sleeps throughout the morning and in the afternoons that she is bored and she is unable to sleep well at night. She also snacks during the day. She denies any suicidal ideations. She would like to get back into exercise. She felt that when she was working at  [employer] which she worked at for 23 years.    Overdue for PAP Smear   Needs flu shot today   HIV test- asked for this secondary to her history, she likes testing once a year, no current sexual activity, or vaginal discharge     Review of Systems - per above         Objective:   Physical Exam GEN- NAD, alert and oriented x3 CVS- RRR, no murmur RESP-CTAB EXT- No edema MSK- +crepuitus bilat, normal ROM, no effusion noted, no erythema, no warmth, Pulses- Radial, DP- 2+ Diabetic foot- normal proprioception,Normal monofilament +callus formation heels, No open lesions, Verry thick nails Psych- normal mood, normal affect, not overly depressed or anxious appearing        Assessment & Plan:

## 2011-09-16 NOTE — Patient Instructions (Signed)
Please get your labs done, do not eat after midnight Continue your current meds The arthritis medication is Naprosyn, take with food as needed I have refilled your pain medication Today you received your flu shot and tetanus booster Please schedule your Mammogram Next visit in 4 months- Make this a physical - PAP smear

## 2011-09-18 NOTE — Assessment & Plan Note (Signed)
Pt does not meet criteria for depression at this time, she is bored at home, encouraged a change in her routine, water aerobics or exercise class for her knees, visiting a senior center. She will look into these. No meds at this time

## 2011-09-18 NOTE — Assessment & Plan Note (Signed)
Monitor BP, will start low dose ACEI

## 2012-02-13 ENCOUNTER — Other Ambulatory Visit: Payer: Self-pay

## 2012-02-13 MED ORDER — NAPROXEN 500 MG PO TABS: 500.0000 mg | ORAL_TABLET | Freq: Two times a day (BID) | ORAL | Status: DC

## 2012-06-08 ENCOUNTER — Emergency Department (HOSPITAL_COMMUNITY): Payer: Medicaid Other

## 2012-06-08 ENCOUNTER — Encounter (HOSPITAL_COMMUNITY): Payer: Self-pay | Admitting: *Deleted

## 2012-06-08 ENCOUNTER — Emergency Department (HOSPITAL_COMMUNITY)
Admission: EM | Admit: 2012-06-08 | Discharge: 2012-06-08 | Disposition: A | Discharge status: Home / Self Care | Payer: Medicaid Other | Attending: Emergency Medicine | Admitting: Emergency Medicine

## 2012-06-08 DIAGNOSIS — IMO0002 Reserved for concepts with insufficient information to code with codable children: Secondary | ICD-10-CM | POA: Insufficient documentation

## 2012-06-08 DIAGNOSIS — Y92410 Unspecified street and highway as the place of occurrence of the external cause: Secondary | ICD-10-CM | POA: Insufficient documentation

## 2012-06-08 DIAGNOSIS — S0001XA Abrasion of scalp, initial encounter: Secondary | ICD-10-CM

## 2012-06-08 DIAGNOSIS — Z9109 Other allergy status, other than to drugs and biological substances: Secondary | ICD-10-CM | POA: Insufficient documentation

## 2012-06-08 DIAGNOSIS — F3289 Other specified depressive episodes: Secondary | ICD-10-CM | POA: Insufficient documentation

## 2012-06-08 DIAGNOSIS — E785 Hyperlipidemia, unspecified: Secondary | ICD-10-CM | POA: Insufficient documentation

## 2012-06-08 DIAGNOSIS — S0990XA Unspecified injury of head, initial encounter: Secondary | ICD-10-CM | POA: Insufficient documentation

## 2012-06-08 DIAGNOSIS — T07XXXA Unspecified multiple injuries, initial encounter: Secondary | ICD-10-CM

## 2012-06-08 DIAGNOSIS — Z79899 Other long term (current) drug therapy: Secondary | ICD-10-CM | POA: Insufficient documentation

## 2012-06-08 DIAGNOSIS — K219 Gastro-esophageal reflux disease without esophagitis: Secondary | ICD-10-CM | POA: Insufficient documentation

## 2012-06-08 DIAGNOSIS — F329 Major depressive disorder, single episode, unspecified: Secondary | ICD-10-CM | POA: Insufficient documentation

## 2012-06-08 MED ORDER — BACITRACIN-NEOMYCIN-POLYMYXIN 400-5-5000 EX OINT
TOPICAL_OINTMENT | Freq: Once | CUTANEOUS | Status: AC
  Administered 2012-06-08: 1 via TOPICAL
  Filled 2012-06-08: qty 1

## 2012-06-08 MED ORDER — HYDROCODONE-ACETAMINOPHEN 5-325 MG PO TABS
1.0000 | ORAL_TABLET | Freq: Once | ORAL | Status: AC
  Administered 2012-06-08: 1 via ORAL
  Filled 2012-06-08: qty 1

## 2012-06-08 MED ORDER — HYDROCODONE-ACETAMINOPHEN 5-325 MG PO TABS: 1.0000 | ORAL_TABLET | ORAL | Status: AC | PRN
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
## 2012-06-08 NOTE — ED Provider Notes (Signed)
Medical screening examination/treatment/procedure(s) were performed by non-physician practitioner and as supervising physician I was immediately available for consultation/collaboration.   Gerrit Rafalski L Bruk [place], MD 06/08/12 2253 

## 2012-06-08 NOTE — ED Notes (Signed)
Reviewed d/c instructions w/patient who verbalized understanding.  Able to ambulate w/out assistance.  Daughter driving pt. Home.

## 2012-06-08 NOTE — Discharge Instructions (Signed)
Your CT scans and xrays are negative for acute changes. Please see your primary MD for follow up and recheck.Contusion A contusion is a deep bruise. Contusions are the result of an injury that caused bleeding under the skin. The contusion may turn blue, purple, or yellow. Minor injuries will give you a painless contusion, but more severe contusions may stay painful and swollen for a few weeks.  CAUSES  A contusion is usually caused by a blow, trauma, or direct force to an area of the body. SYMPTOMS   Swelling and redness of the injured area.   Bruising of the injured area.   Tenderness and soreness of the injured area.   Pain.  DIAGNOSIS  The diagnosis can be made by taking a history and physical exam. An X-ray, CT scan, or MRI may be needed to determine if there were any associated injuries, such as fractures. TREATMENT  Specific treatment will depend on what area of the body was injured. In general, the best treatment for a contusion is resting, icing, elevating, and applying cold compresses to the injured area. Over-the-counter medicines may also be recommended for pain control. Ask your caregiver what the best treatment is for your contusion. HOME CARE INSTRUCTIONS   Put ice on the injured area.   Put ice in a plastic bag.   Place a towel between your skin and the bag.   Leave the ice on for 15 to 20 minutes, 3 to 4 times a day.   Only take over-the-counter or prescription medicines for pain, discomfort, or fever as directed by your caregiver. Your caregiver may recommend avoiding anti-inflammatory medicines (aspirin, ibuprofen, and naproxen) for 48 hours because these medicines may increase bruising.   Rest the injured area.   If possible, elevate the injured area to reduce swelling.  SEEK IMMEDIATE MEDICAL CARE IF:   You have increased bruising or swelling.   You have pain that is getting worse.   Your swelling or pain is not relieved with medicines.  MAKE SURE YOU:    Understand these instructions.   Will watch your condition.   Will get help right away if you are not doing well or get worse.  Document Released: 09/07/2005 Document Revised: 11/17/2011 Document Reviewed: 10/03/2011 [facility] Patient Information 2012 [place], [place].Abrasions An abrasion is a scraped area on the skin. Abrasions do not go through all layers of the skin.  HOME CARE  Change any bandages (dressings) as told by your doctor. If the bandage sticks, soak it off with warm, soapy water. Change the bandage if it gets wet, dirty, or starts to smell.   Wash the area with soap and water twice a day. Rinse off the soap. Pat the area dry with a clean towel.   Look at the injured area for signs of infection. Infection signs include redness, puffiness (swelling), tenderness, or yellowish white fluid (pus) coming from the wound.   Apply medicated cream as told by your doctor.   Only take medicine as told by your doctor.   Follow up with your doctor as told.  GET HELP RIGHT AWAY IF:   You have more pain in your wound.   You have redness, puffiness (swelling), or tenderness around your wound.   You have yellowish white fluid (pus) coming from your wound.   You have a fever.   A bad smell is coming from the wound or bandage.  MAKE SURE YOU:   Understand these instructions.   Will watch your condition.  Will get help right away if you are not doing well or get worse.  Document Released: 05/16/2008 Document Revised: 11/17/2011 Document Reviewed: 11/01/2011 [facility] Patient Information 2012 [place], [place].

## 2012-10-12 ENCOUNTER — Other Ambulatory Visit: Payer: Self-pay | Admitting: Family Medicine

## 2013-11-19 ENCOUNTER — Telehealth: Payer: Self-pay | Admitting: *Deleted

## 2013-11-19 MED ORDER — OMEPRAZOLE 40 MG PO CPDR: DELAYED_RELEASE_CAPSULE | ORAL | Status: DC

## 2013-11-19 MED ORDER — GLYBURIDE-METFORMIN 5-500 MG PO TABS: ORAL_TABLET | ORAL | Status: DC

## 2013-11-19 NOTE — Telephone Encounter (Signed)
Meds refilled.

## 2014-01-22 ENCOUNTER — Ambulatory Visit (INDEPENDENT_AMBULATORY_CARE_PROVIDER_SITE_OTHER): Payer: Medicaid Other | Admitting: Family Medicine

## 2014-01-22 ENCOUNTER — Encounter: Payer: Self-pay | Admitting: Family Medicine

## 2014-01-22 ENCOUNTER — Other Ambulatory Visit: Payer: Self-pay | Admitting: Family Medicine

## 2014-01-22 VITALS — BP 142/80 | HR 80 | Temp 98.3°F | Resp 20 | Ht 69.0 in | Wt 210.0 lb

## 2014-01-22 DIAGNOSIS — Z23 Encounter for immunization: Secondary | ICD-10-CM

## 2014-01-22 DIAGNOSIS — M1710 Unilateral primary osteoarthritis, unspecified knee: Secondary | ICD-10-CM

## 2014-01-22 DIAGNOSIS — I10 Essential (primary) hypertension: Secondary | ICD-10-CM

## 2014-01-22 DIAGNOSIS — Z0000 Encounter for general adult medical examination without abnormal findings: Secondary | ICD-10-CM

## 2014-01-22 DIAGNOSIS — E785 Hyperlipidemia, unspecified: Secondary | ICD-10-CM

## 2014-01-22 DIAGNOSIS — R195 Other fecal abnormalities: Secondary | ICD-10-CM

## 2014-01-22 DIAGNOSIS — E119 Type 2 diabetes mellitus without complications: Secondary | ICD-10-CM

## 2014-01-22 DIAGNOSIS — Z1211 Encounter for screening for malignant neoplasm of colon: Secondary | ICD-10-CM

## 2014-01-22 DIAGNOSIS — Z124 Encounter for screening for malignant neoplasm of cervix: Secondary | ICD-10-CM

## 2014-01-22 DIAGNOSIS — Z1231 Encounter for screening mammogram for malignant neoplasm of breast: Secondary | ICD-10-CM

## 2014-01-22 DIAGNOSIS — IMO0002 Reserved for concepts with insufficient information to code with codable children: Secondary | ICD-10-CM

## 2014-01-22 DIAGNOSIS — N760 Acute vaginitis: Secondary | ICD-10-CM

## 2014-01-22 LAB — CBC WITH DIFFERENTIAL/PLATELET
Basophils Absolute: 0 10*3/uL (ref 0.0–0.1)
Basophils Relative: 1 % (ref 0–1)
Eosinophils Absolute: 0.1 10*3/uL (ref 0.0–0.7)
Eosinophils Relative: 1 % (ref 0–5)
HCT: 43.2 % (ref 36.0–46.0)
Hemoglobin: 14.6 g/dL (ref 12.0–15.0)
Lymphocytes Relative: 18 % (ref 12–46)
Lymphs Abs: 1.2 10*3/uL (ref 0.7–4.0)
MCH: 32.4 pg (ref 26.0–34.0)
MCHC: 33.8 g/dL (ref 30.0–36.0)
MCV: 96 fL (ref 78.0–100.0)
Monocytes Absolute: 0.4 10*3/uL (ref 0.1–1.0)
Monocytes Relative: 7 % (ref 3–12)
Neutro Abs: 4.9 10*3/uL (ref 1.7–7.7)
Neutrophils Relative %: 73 % (ref 43–77)
Platelets: 252 10*3/uL (ref 150–400)
RBC: 4.5 MIL/uL (ref 3.87–5.11)
RDW: 12.9 % (ref 11.5–15.5)
WBC: 6.7 10*3/uL (ref 4.0–10.5)

## 2014-01-22 LAB — MICROALBUMIN / CREATININE URINE RATIO
Creatinine, Urine: 114.8 mg/dL
Microalb Creat Ratio: 4.4 mg/g (ref 0.0–30.0)
Microalb, Ur: 0.5 mg/dL (ref 0.00–1.89)

## 2014-01-22 LAB — COMPREHENSIVE METABOLIC PANEL
ALT: 8 U/L (ref 0–35)
AST: 11 U/L (ref 0–37)
Albumin: 3.6 g/dL (ref 3.5–5.2)
Alkaline Phosphatase: 107 U/L (ref 39–117)
BUN: 21 mg/dL (ref 6–23)
CO2: 27 mEq/L (ref 19–32)
Calcium: 8.8 mg/dL (ref 8.4–10.5)
Chloride: 103 mEq/L (ref 96–112)
Creat: 0.96 mg/dL (ref 0.50–1.10)
Glucose, Bld: 216 mg/dL — ABNORMAL HIGH (ref 70–99)
Potassium: 4.3 mEq/L (ref 3.5–5.3)
Sodium: 138 mEq/L (ref 135–145)
Total Bilirubin: 0.5 mg/dL (ref 0.2–1.2)
Total Protein: 7 g/dL (ref 6.0–8.3)

## 2014-01-22 LAB — WET PREP FOR TRICH, YEAST, CLUE
Trich, Wet Prep: NONE SEEN
WBC, Wet Prep HPF POC: NONE SEEN
Yeast Wet Prep HPF POC: NONE SEEN

## 2014-01-22 LAB — HEMOGLOBIN A1C
Hgb A1c MFr Bld: 8.1 % — ABNORMAL HIGH (ref <5.7)
Mean Plasma Glucose: 186 mg/dL — ABNORMAL HIGH (ref <117)

## 2014-01-22 LAB — LIPID PANEL
Cholesterol: 147 mg/dL (ref 0–200)
HDL: 49 mg/dL (ref >39)
LDL Cholesterol: 86 mg/dL (ref 0–99)
Total CHOL/HDL Ratio: 3 Ratio
Triglycerides: 61 mg/dL (ref <150)
VLDL: 12 mg/dL (ref 0–40)

## 2014-01-22 LAB — HIV ANTIBODY (ROUTINE TESTING W REFLEX): HIV: NONREACTIVE

## 2014-01-22 MED ORDER — OMEPRAZOLE 40 MG PO CPDR: DELAYED_RELEASE_CAPSULE | ORAL | Status: DC

## 2014-01-22 MED ORDER — TRAMADOL HCL 50 MG PO TABS: 50.0000 mg | ORAL_TABLET | Freq: Three times a day (TID) | ORAL | Status: DC | PRN

## 2014-01-22 MED ORDER — GLYBURIDE-METFORMIN 5-500 MG PO TABS: ORAL_TABLET | ORAL | Status: DC

## 2014-01-22 MED ORDER — LISINOPRIL-HYDROCHLOROTHIAZIDE 10-12.5 MG PO TABS: 1.0000 | ORAL_TABLET | Freq: Every day | ORAL | Status: DC

## 2014-01-22 NOTE — Patient Instructions (Addendum)
Start new blood pressure medication once a day  Take ultram for pain Take 1 of the glucovance twice a day until we tell you otherwise Flu shot and pneumonia shot given Referral for colonoscopy Mammogram to be done F/U1 week for labs and medications

## 2014-01-22 NOTE — Assessment & Plan Note (Signed)
Will place her on tramadol 3 times a day as needed I'm avoiding hydrocodone at this time as he is she will followup on a regular basis for her healthcare

## 2014-01-22 NOTE — Assessment & Plan Note (Addendum)
She's currently taking Glucovance I will recheck her A1c as well as her about panel in her urine microalbumin Script for new meter sent to pharmacy

## 2014-01-22 NOTE — Progress Notes (Signed)
Patient ID: AFTYN NOTT, female   DOB: 1951-07-04, 63 y.o.   MRN: [id number]   Subjective:    Patient ID: Miranda Sampson, female    DOB: [birth year], 63 y.o.   MRN: [id number]  Patient presents for Annual Exam  patient here for physical exam however she's not been seen since October of 2012. She's a history of hypertension as well as diabetes mellitus and osteoarthritis. She has been on Glucovance for the past 2 months ( my office accidentally refilled before visit) she's been taking one tablet twice a day as well as her Prilosec for her acid reflux. She's not been checking her blood sugar she no longer has a meter. She is due today for Pap smear and is due for mammogram. She's also due for colonoscopy. She requests medication for her arthritis pain states that she has difficulty getting in and out of chairs and walking short distances she has knee pain She has not seen any provider since our last visit  She request STD check   Review Of Systems:  GEN- denies fatigue, fever, weight loss,weakness, recent illness HEENT- denies eye drainage, change in vision, nasal discharge, CVS- denies chest pain, palpitations RESP- denies SOB, cough, wheeze ABD- denies N/V, change in stools, abd pain GU- denies dysuria, hematuria, dribbling, incontinence MSK-+joint pain, muscle aches, injury Neuro- denies headache, dizziness, syncope, seizure activity       Objective:    BP 142/80  Pulse 80  Temp(Src) 98.3 F (36.8 C) (Oral)  Resp 20  Ht 5\' 9"  (1.753 m)  Wt 210 lb (95.255 kg)  BMI 31.00 kg/m2 GEN- NAD, alert and oriented x3 HEENT- PERRL, EOMI, non injected sclera, pink conjunctiva, MMM, oropharynx clear Neck- Supple, no thyromegaly Breast- normal symmetry, no nipple inversion,no nipple drainage, no nodules or lumps felt Nodes- no axillary nodes CVS- RRR, no murmur RESP-CTAB ABD-NABS,soft,NT,ND GU- normal external genitalia, vaginal mucosa pink and moist, cervix visualized no growth, no blood  form os, minimal thin clear discharge, no CMT, no ovarian masses, uterus normal size Rectum- no external lesions, normal tone, FOBT  Positive, soft brown stool in vault EXT- No edema Pulses- Radial, DP- 2+        Assessment & Plan:      Problem List Items Addressed This Visit   KNEE, ARTHRITIS, DEGEN./OSTEO     Will place her on tramadol 3 times a day as needed I'm avoiding hydrocodone at this time as he is she will followup on a regular basis for her healthcare     Relevant Medications      traMADol (ULTRAM) tablet 50 mg   Hyperlipidemia     Check lipid panel and plan to start a statin drug    Relevant Medications      LISINOPRIL-HCTZ 10-12.5 MG PO TABS   Essential hypertension, benign     Start Blood pressure medication lisinopril HCTZ once a day    Diabetes mellitus     She's currently taking Glucovance I will recheck her A1c as well as her about panel in her urine microalbumin    Relevant Medications      glyBURIDE-metformin (GLUCOVANCE) 5-500 MG per tablet    Other Visit Diagnoses   Screening for malignant neoplasm of the cervix    -  Primary    Relevant Orders       PAP, ThinPrep ASCUS Rflx HPV Rflx Type    Type II or unspecified type diabetes mellitus without mention of complication, not stated as uncontrolled  Relevant Orders       Comprehensive metabolic panel       CBC with Differential       Lipid panel       Hemoglobin A1c       Microalbumin / creatinine urine ratio    Vaginitis and vulvovaginitis        Relevant Orders       WET PREP FOR TRICH, YEAST, CLUE (Completed)       GC/chlamydia probe amp, genital    Need for prophylactic vaccination against Streptococcus pneumoniae (pneumococcus)        Relevant Orders       Pneumococcal polysaccharide vaccine 23-valent greater than or equal to 2yo subcutaneous/IM (Completed)    Need for prophylactic vaccination and inoculation against influenza        Relevant Orders       Flu Vaccine QUAD 36+ mos PF  IM (Fluarix) (Completed)       Note: This dictation was prepared with Dragon dictation along with smaller phrase technology. Any transcriptional errors that result from this process are unintentional.

## 2014-01-22 NOTE — Assessment & Plan Note (Signed)
Check lipid panel and plan to start a statin drug

## 2014-01-22 NOTE — Assessment & Plan Note (Signed)
Start Blood pressure medication lisinopril HCTZ once a day

## 2014-01-23 LAB — GC/CHLAMYDIA PROBE AMP
CT Probe RNA: NEGATIVE
GC Probe RNA: NEGATIVE

## 2014-01-23 LAB — PAP THINPREP ASCUS RFLX HPV RFLX TYPE

## 2014-01-24 ENCOUNTER — Encounter: Payer: Self-pay | Admitting: *Deleted

## 2014-01-28 ENCOUNTER — Ambulatory Visit (HOSPITAL_COMMUNITY): Payer: Medicaid Other

## 2014-01-29 ENCOUNTER — Ambulatory Visit: Payer: Medicaid Other | Admitting: Family Medicine

## 2014-01-31 ENCOUNTER — Ambulatory Visit (HOSPITAL_COMMUNITY)
Admission: RE | Admit: 2014-01-31 | Discharge: 2014-01-31 | Disposition: A | Discharge status: Home / Self Care | Payer: Medicaid Other | Source: Ambulatory Visit | Attending: Family Medicine | Admitting: Family Medicine

## 2014-01-31 DIAGNOSIS — Z1231 Encounter for screening mammogram for malignant neoplasm of breast: Secondary | ICD-10-CM | POA: Diagnosis not present

## 2014-02-05 ENCOUNTER — Ambulatory Visit: Payer: Medicaid Other | Admitting: Family Medicine

## 2014-02-05 ENCOUNTER — Ambulatory Visit (INDEPENDENT_AMBULATORY_CARE_PROVIDER_SITE_OTHER): Payer: Medicaid Other | Admitting: Family Medicine

## 2014-02-05 ENCOUNTER — Encounter: Payer: Self-pay | Admitting: Family Medicine

## 2014-02-05 VITALS — BP 148/72 | HR 76 | Temp 98.0°F | Resp 16 | Ht 70.5 in | Wt 204.0 lb

## 2014-02-05 DIAGNOSIS — E119 Type 2 diabetes mellitus without complications: Secondary | ICD-10-CM

## 2014-02-05 DIAGNOSIS — IMO0002 Reserved for concepts with insufficient information to code with codable children: Secondary | ICD-10-CM

## 2014-02-05 DIAGNOSIS — I10 Essential (primary) hypertension: Secondary | ICD-10-CM

## 2014-02-05 DIAGNOSIS — M1710 Unilateral primary osteoarthritis, unspecified knee: Secondary | ICD-10-CM

## 2014-02-05 MED ORDER — NAPROXEN 500 MG PO TABS: 500.0000 mg | ORAL_TABLET | Freq: Two times a day (BID) | ORAL | Status: DC

## 2014-02-05 MED ORDER — HYDROCODONE-ACETAMINOPHEN 5-325 MG PO TABS: 1.0000 | ORAL_TABLET | Freq: Two times a day (BID) | ORAL | Status: DC | PRN

## 2014-02-05 NOTE — Assessment & Plan Note (Addendum)
Add naprosyn Change to low dose hydrocodone BID prn D/C ultram Pain contract signed I do not think this qualifies for PCS services and discussed this with her

## 2014-02-05 NOTE — Assessment & Plan Note (Signed)
D/c Lisinopril due to cough Given samples of Benicar HCTZ

## 2014-02-05 NOTE — Assessment & Plan Note (Signed)
Uncontrolled, add Januvia

## 2014-02-05 NOTE — Progress Notes (Signed)
Patient ID: Miranda Sampson, female   DOB: 06/17/1951, 63 y.o.   MRN: [id number]   Subjective:    Patient ID: Miranda DouglasLena B Sampson, female    DOB: 10/03/1951, 63 y.o.   MRN: [id number]  Patient presents for Follow- up, Discuss forms, Discuss Labs and pain  OA knees- Continues to have pain in both knees, occasional swelling, ultram is not helping she has tried taking two. She request something stronger to take. She request a handicap form and PCS servies, but she is able to get up and bathe herself, tidy around the home some and cook for herself  HTN- taking meds as prescribed, but notes a dry hacking cough with the lisinopril  Fasting labs discussed     Review Of Systems:  GEN- denies fatigue, fever, weight loss,weakness, recent illness HEENT- denies eye drainage, change in vision, nasal discharge, CVS- denies chest pain, palpitations RESP- denies SOB, cough, wheeze MSK- + joint pain, muscle aches, injury Neuro- denies headache, dizziness, syncope, seizure activity       Objective:    BP 148/72  Pulse 76  Temp(Src) 98 F (36.7 C)  Resp 16  Ht 5' 10.5" (1.791 m)  Wt 204 lb (92.534 kg)  BMI 28.85 kg/m2 GEN- NAD, alert and oriented x3 CVS- RRR, no murmur RESP-CTAB MSK-   Normal inspection, no effusion, Fair ROM bilat, +crepitus left knee, EXT- No edema Pulses- Radial 2+        Assessment & Plan:      Problem List Items Addressed This Visit   KNEE, ARTHRITIS, DEGEN./OSTEO - Primary     Add naprosyn Change to low dose hydrocodone BID prn D/C ultram    Relevant Medications      naproxen (NAPROSYN) tablet      HYDROcodone-acetaminophen (NORCO/VICODIN) 5-325 MG per tablet   Essential hypertension, benign     D/c Lisinopril due to cough Given samples of Benicar HCTZ    Relevant Medications      olmesartan-hydrochlorothiazide (BENICAR HCT) 20-12.5 MG per tablet   Diabetes mellitus     Uncontrolled, add Januvia    Relevant Medications      sitaGLIPtin (JANUVIA) 100 MG  tablet      Note: This dictation was prepared with Dragon dictation along with smaller Lobbyistphrase technology. Any transcriptional errors that result from this process are unintentional.

## 2014-02-05 NOTE — Patient Instructions (Signed)
Your diabetes is uncontrolled- your goal is to get A1C < 7% Start [place]uvia once a day, with your other medications Stop the lisinopril for blood pressure Start benicar HCTZ once a day- sanples provided New pain medication- Pain contract F/U 3 months

## 2014-02-07 MED ORDER — SITAGLIPTIN PHOSPHATE 100 MG PO TABS: 100.0000 mg | ORAL_TABLET | Freq: Every day | ORAL | Status: DC

## 2014-02-19 ENCOUNTER — Telehealth: Payer: Self-pay | Admitting: *Deleted

## 2014-02-19 NOTE — Telephone Encounter (Signed)
Call placed to patient and patient made aware.  

## 2014-02-19 NOTE — Telephone Encounter (Signed)
Received call from patient daughter regarding PCS forms for personal caregiver.   Ok to fill out?

## 2014-02-19 NOTE — Telephone Encounter (Signed)
Message copied by Phillips OdorSIX, CHRISTINA H on Wed Feb 19, 2014  9:12 AM ------      Message from: Eustaquio MaizeMEDLIN, SARAH J      Created: Tue Feb 18, 2014  4:04 PM      Contact: [phone]       Call Margaret-Ms Roskos's daughter regarding forms that were to be filled out. ------

## 2014-02-19 NOTE — Telephone Encounter (Signed)
No I already discussed with pt in OV that I do not feel she qualifies for this She is able to bathe herself, feed herself, ambulate, cook/clean  I reserve this for more impaired patients

## 2014-02-20 ENCOUNTER — Telehealth: Payer: Self-pay | Admitting: *Deleted

## 2014-02-20 NOTE — Telephone Encounter (Signed)
Call placed to patient. LMTRC.  

## 2014-02-20 NOTE — Telephone Encounter (Signed)
Message copied by Phillips OdorSIX, Jamori Biggar H on Thu Feb 20, 2014 10:02 AM ------      Message from: Milinda AntisURHAM, KAWANTA F      Created: Wed Feb 19, 2014 10:31 PM      Regarding: FW: f/u Benicar, and send script              Pt was placed on benicar samples because of dry cough with lisinopril, see if she is able to tolerate this, if so, okay to fill medication.            ----- Message -----         From: Salley ScarletKawanta F Manchester, MD         Sent: 02/17/2014           To: Salley ScarletKawanta F Purdy, MD      Subject: f/u Benicar, and send script                                    ------

## 2014-02-21 NOTE — Telephone Encounter (Signed)
LMTRC

## 2014-02-26 ENCOUNTER — Telehealth: Payer: Self-pay | Admitting: *Deleted

## 2014-02-26 MED ORDER — OLMESARTAN MEDOXOMIL-HCTZ 20-12.5 MG PO TABS: 1.0000 | ORAL_TABLET | Freq: Every day | ORAL | Status: DC

## 2014-02-26 NOTE — Telephone Encounter (Signed)
Okay to fill the benicar then

## 2014-02-26 NOTE — Telephone Encounter (Signed)
Received e-mail requesting PA for Benicar.   PA submitted.

## 2014-02-26 NOTE — Telephone Encounter (Signed)
Call placed to patient.   Patient reports that dry cough does continue, but it is improving.   MD to be made aware.

## 2014-02-26 NOTE — Telephone Encounter (Signed)
Prescription sent to pharmacy. .   Call placed to patient and patient made aware.  

## 2014-03-04 ENCOUNTER — Encounter (INDEPENDENT_AMBULATORY_CARE_PROVIDER_SITE_OTHER): Payer: Self-pay | Admitting: *Deleted

## 2014-03-18 ENCOUNTER — Encounter (INDEPENDENT_AMBULATORY_CARE_PROVIDER_SITE_OTHER): Payer: Self-pay | Admitting: Internal Medicine

## 2014-03-18 ENCOUNTER — Encounter (INDEPENDENT_AMBULATORY_CARE_PROVIDER_SITE_OTHER): Payer: Self-pay | Admitting: *Deleted

## 2014-03-18 ENCOUNTER — Ambulatory Visit (INDEPENDENT_AMBULATORY_CARE_PROVIDER_SITE_OTHER): Payer: Medicaid Other | Admitting: Internal Medicine

## 2014-03-18 ENCOUNTER — Other Ambulatory Visit (INDEPENDENT_AMBULATORY_CARE_PROVIDER_SITE_OTHER): Payer: Self-pay | Admitting: *Deleted

## 2014-03-18 VITALS — BP 112/56 | HR 72 | Ht 71.0 in | Wt 204.5 lb

## 2014-03-18 DIAGNOSIS — R195 Other fecal abnormalities: Secondary | ICD-10-CM

## 2014-03-18 DIAGNOSIS — K921 Melena: Secondary | ICD-10-CM | POA: Insufficient documentation

## 2014-03-18 NOTE — Progress Notes (Signed)
Subjective:     Patient ID: Miranda Sampson, female   DOB: [birth year], 63 y.o.   MRN: [id number]  HPI Referred to our office by Dr. Velna Hatchet . [place] for blood in her stool. Patient evaluated in office and found to have blood in her stool. Patient denies any hx of rectal bleeding. Her stools are brown in color. She tells me she had a black stool over the weekend. Appetite is good. No weight loss. She occasionally has acid reflux and takes Prilosec for this.  She usually has a BM daily. Takes Naprosyn one every 8 hrs and ASA 325mg  daily.  No family hx of colon cancer.\ She has a remote hx of a blockage in her intestines and surgery 1996.  She had a colonoscopy in 2010 by Dr. Jena Gauss:   IMPRESSION:  1. Diminutive rectal polyp status post cold biopsy removal.  2. Status post a right colon surgery with normal-appearing anastomosis  with small bowel. Residual colonic mucosa appeared otherwise  normal. Biopsy: RECTUM, POLYP: HYPERPLASTIC POLYP. NO ADENOMATOUS CHANGES OR EVIDENCE OF MALIGNANCY IDENTIFIED (BIOPSY).  Colonoscopy 2004 Dr. Karilyn Cota: Anemia: FINAL DIAGNOSES:  1. Examination limited to hepatic flexure.  2. Redundant colon with suboptimal prep, but no abnormality noted in the  parts of the colon that were examined.   EGD 2004 Dr. Karilyn Cota anemia:  FINAL DIAGNOSES:  1. Nonerosive antral gastritis secondary to Helicobacter pylori infection  previously diagnosed.  2. Small sliding hiatal hernia with mild changes of reflux esophagitis at  the level of the gastroesophageal junction.  3. No lesion identified to confirm iron-deficiency anemia.   CBC    Component Value Date/Time   WBC 6.7 01/22/2014 0909   RBC 4.50 01/22/2014 0909   HGB 14.6 01/22/2014 0909   HCT 43.2 01/22/2014 0909   PLT 252 01/22/2014 0909   MCV 96.0 01/22/2014 0909   MCH 32.4 01/22/2014 0909   MCHC 33.8 01/22/2014 0909   RDW 12.9 01/22/2014 0909   LYMPHSABS 1.2 01/22/2014 0909   MONOABS 0.4 01/22/2014 0909   EOSABS 0.1  01/22/2014 0909   BASOSABS 0.0 01/22/2014 4098      Review of Systems Past Medical History  Diagnosis Date  . Allergy   . GERD (gastroesophageal reflux disease)   . Diabetes mellitus   . Arthritis   . Depression   . Substance abuse     Cocaine quit 2009  . Hyperlipidemia     Past Surgical History  Procedure Laterality Date  .  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 Tonsillectomy      No Known Allergies  Current Outpatient Prescriptions on File Prior to Visit  Medication Sig Dispense Refill  . aspirin 325 MG EC tablet Take 325 mg by mouth every 8 (eight) hours as needed. For pain      . cetirizine (ZYRTEC) 10 MG tablet Take 10 mg by mouth daily.      Marland Kitchen glyBURIDE-metformin (GLUCOVANCE) 5-500 MG per tablet TAKE 2 TABLETS BY MOUTH TWICE A DAY.  120 tablet  3  . HYDROcodone-acetaminophen (NORCO/VICODIN) 5-325 MG per tablet Take 1 tablet by mouth 2 (two) times daily as needed for moderate pain.  60 tablet  0  . naproxen (NAPROSYN) 500 MG tablet Take 1 tablet (500 mg total) by mouth 2 (two) times daily with a meal.  60 tablet  3  . olmesartan-hydrochlorothiazide (BENICAR HCT) 20-12.5 MG per tablet Take 1 tablet by mouth daily.  30 tablet  3  . omeprazole (PRILOSEC)  40 MG capsule TAKE (1) CAPSULE BY MOUTH EVERY DAY FOR ACID REFLUX.  30 capsule  2  . sitaGLIPtin (JANUVIA) 100 MG tablet Take 1 tablet (100 mg total) by mouth daily.  30 tablet  3   No current facility-administered medications on file prior to visit.   Widowed. Three children good health. Unemployed.       Objective:   Physical Exam  Filed Vitals:   03/18/14 1518  BP: 112/56  Pulse: 72  Height: 5\' 11"  (1.803 m)  Weight: 204 lb 8 oz (92.761 kg)  Alert and oriented. Skin warm and dry. Oral mucosa is moist.   . Sclera anicteric, conjunctivae is pink. Thyroid not enlarged. No cervical lymphadenopathy. Lungs clear. Heart regular rate and rhythm.  Abdomen is soft. Bowel sounds are positive. No hepatomegaly. No abdominal  masses felt. No tenderness.  No edema to lower extremities.  Stool brown and guaiac positive.      Assessment:     Heme positive stool. Recent hx of melena. I discussed this case with Dr. Karilyn Cotaehman.     Plan:    EGD with Dr. Karilyn Cotaehman. If normal, colonoscopy in the near future. The risks and benefits such as perforation, bleeding, and infection were reviewed with the patient and is agreeable.

## 2014-03-18 NOTE — Telephone Encounter (Signed)
PA has not been determined.

## 2014-03-18 NOTE — Patient Instructions (Signed)
EGD with Dr. Rehman. The risks and benefits such as perforation, bleeding, and infection were reviewed with the patient and is agreeable. 

## 2014-03-19 ENCOUNTER — Encounter (HOSPITAL_COMMUNITY): Payer: Self-pay | Admitting: Pharmacy Technician

## 2014-03-20 ENCOUNTER — Encounter (HOSPITAL_COMMUNITY)
Admission: RE | Disposition: A | Discharge status: Home / Self Care | Payer: Self-pay | Source: Ambulatory Visit | Attending: Internal Medicine

## 2014-03-20 ENCOUNTER — Ambulatory Visit (HOSPITAL_COMMUNITY)
Admission: RE | Admit: 2014-03-20 | Discharge: 2014-03-20 | Disposition: A | Discharge status: Home / Self Care | Payer: Medicaid Other | Source: Ambulatory Visit | Attending: Internal Medicine | Admitting: Internal Medicine

## 2014-03-20 ENCOUNTER — Encounter (HOSPITAL_COMMUNITY): Payer: Self-pay | Admitting: *Deleted

## 2014-03-20 DIAGNOSIS — Z79899 Other long term (current) drug therapy: Secondary | ICD-10-CM | POA: Insufficient documentation

## 2014-03-20 DIAGNOSIS — K921 Melena: Secondary | ICD-10-CM

## 2014-03-20 DIAGNOSIS — K219 Gastro-esophageal reflux disease without esophagitis: Secondary | ICD-10-CM | POA: Insufficient documentation

## 2014-03-20 DIAGNOSIS — I10 Essential (primary) hypertension: Secondary | ICD-10-CM | POA: Insufficient documentation

## 2014-03-20 DIAGNOSIS — R195 Other fecal abnormalities: Secondary | ICD-10-CM

## 2014-03-20 DIAGNOSIS — E119 Type 2 diabetes mellitus without complications: Secondary | ICD-10-CM | POA: Insufficient documentation

## 2014-03-20 DIAGNOSIS — Z9049 Acquired absence of other specified parts of digestive tract: Secondary | ICD-10-CM | POA: Insufficient documentation

## 2014-03-20 DIAGNOSIS — F329 Major depressive disorder, single episode, unspecified: Secondary | ICD-10-CM | POA: Insufficient documentation

## 2014-03-20 DIAGNOSIS — K2960 Other gastritis without bleeding: Secondary | ICD-10-CM | POA: Insufficient documentation

## 2014-03-20 DIAGNOSIS — E785 Hyperlipidemia, unspecified: Secondary | ICD-10-CM | POA: Insufficient documentation

## 2014-03-20 DIAGNOSIS — Z7982 Long term (current) use of aspirin: Secondary | ICD-10-CM | POA: Insufficient documentation

## 2014-03-20 DIAGNOSIS — K228 Other specified diseases of esophagus: Secondary | ICD-10-CM

## 2014-03-20 DIAGNOSIS — A048 Other specified bacterial intestinal infections: Secondary | ICD-10-CM

## 2014-03-20 DIAGNOSIS — F3289 Other specified depressive episodes: Secondary | ICD-10-CM | POA: Insufficient documentation

## 2014-03-20 DIAGNOSIS — K269 Duodenal ulcer, unspecified as acute or chronic, without hemorrhage or perforation: Secondary | ICD-10-CM | POA: Insufficient documentation

## 2014-03-20 DIAGNOSIS — K2289 Other specified disease of esophagus: Secondary | ICD-10-CM

## 2014-03-20 HISTORY — PX: ESOPHAGOGASTRODUODENOSCOPY: SHX5428

## 2014-03-20 HISTORY — DX: Essential (primary) hypertension: I10

## 2014-03-20 LAB — GLUCOSE, CAPILLARY: Glucose-Capillary: 118 mg/dL — ABNORMAL HIGH (ref 70–99)

## 2014-03-20 SURGERY — EGD (ESOPHAGOGASTRODUODENOSCOPY)
Anesthesia: Moderate Sedation

## 2014-03-20 MED ORDER — MEPERIDINE HCL 50 MG/ML IJ SOLN
INTRAMUSCULAR | Status: AC
  Filled 2014-03-20: qty 1

## 2014-03-20 MED ORDER — PANTOPRAZOLE SODIUM 40 MG PO TBEC: 40.0000 mg | DELAYED_RELEASE_TABLET | Freq: Two times a day (BID) | ORAL | Status: DC

## 2014-03-20 MED ORDER — BUTAMBEN-TETRACAINE-BENZOCAINE 2-2-14 % EX AERO
INHALATION_SPRAY | CUTANEOUS | Status: DC | PRN
  Administered 2014-03-20: 1 via TOPICAL

## 2014-03-20 MED ORDER — MIDAZOLAM HCL 5 MG/5ML IJ SOLN
INTRAMUSCULAR | Status: DC | PRN
  Administered 2014-03-20: 3 mg via INTRAVENOUS
  Administered 2014-03-20: 2 mg via INTRAVENOUS

## 2014-03-20 MED ORDER — MIDAZOLAM HCL 5 MG/5ML IJ SOLN
INTRAMUSCULAR | Status: AC
  Filled 2014-03-20: qty 10

## 2014-03-20 MED ORDER — MEPERIDINE HCL 50 MG/ML IJ SOLN
INTRAMUSCULAR | Status: DC | PRN
  Administered 2014-03-20 (×2): 25 mg via INTRAVENOUS

## 2014-03-20 MED ORDER — SODIUM CHLORIDE 0.9 % IV SOLN
INTRAVENOUS | Status: DC
  Administered 2014-03-20: 13:00:00 via INTRAVENOUS

## 2014-03-20 MED ORDER — LISINOPRIL-HYDROCHLOROTHIAZIDE 20-12.5 MG PO TABS: 1.0000 | ORAL_TABLET | Freq: Every day | ORAL | Status: DC

## 2014-03-20 MED ORDER — TRAMADOL HCL 50 MG PO TABS: 50.0000 mg | ORAL_TABLET | Freq: Two times a day (BID) | ORAL | Status: DC | PRN

## 2014-03-20 NOTE — Telephone Encounter (Signed)
PA denied.   MD made aware and new orders obtained to change medication to Lisinopril/ HCTZ 20/12.5mg .   Prescription sent to pharmacy.   Call placed to patient and patient made aware.

## 2014-03-20 NOTE — H&P (Signed)
Miranda DouglasLena B Sampson is an 63 y.o. female.   Chief Complaint: Patient is here for EGD. HPI: Patient is 63 year old African female who presents with history of intermittent tarry stools. She complains of intermittent pain across lower abdomen. She denies diarrhea and constipation. She has arthritis and back pain and takes Naprosyn 500 mg 3 times a day and she also thinks aspirin 325 mg daily. She denies heartburn or dysphagia. She states since she has been on omeprazole pain has decreased. She is status post right, colectomy for benign tumor.  Last colonoscopy was in 2010 was unremarkable.  Past Medical History  Diagnosis Date  . Allergy   . GERD (gastroesophageal reflux disease)   . Diabetes mellitus   . Arthritis   . Depression   . Substance abuse     Cocaine quit 2009  . Hyperlipidemia   . Hypertension     Past Surgical History  Procedure Laterality Date  .  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 Tonsillectomy      Family History  Problem Relation Age of Onset  . Heart disease Mother   . Diabetes Mother   . Diabetes Father   . Cancer Sister     leukemia   . Colon cancer Neg Hx   . Stomach cancer Neg Hx    Social History:  reports that she has never smoked. She does not have any smokeless tobacco history on file. She reports that she drinks alcohol. She reports that she does not use illicit drugs.  Allergies: No Known Allergies  Medications Prior to Admission  Medication Sig Dispense Refill  . aspirin 325 MG EC tablet Take 325 mg by mouth daily. For pain      . cetirizine (ZYRTEC) 10 MG tablet Take 10 mg by mouth daily.      Marland Kitchen. glyBURIDE-metformin (GLUCOVANCE) 5-500 MG per tablet Take 2 tablets by mouth 2 (two) times daily with a meal.      . HYDROcodone-acetaminophen (NORCO/VICODIN) 5-325 MG per tablet Take 1 tablet by mouth 2 (two) times daily as needed for moderate pain.  60 tablet  0  . lisinopril-hydrochlorothiazide (ZESTORETIC) 20-12.5 MG per tablet Take 1 tablet by mouth  daily.  90 tablet  3  . naproxen (NAPROSYN) 500 MG tablet Take 1 tablet (500 mg total) by mouth 2 (two) times daily with a meal.  60 tablet  3  . omeprazole (PRILOSEC) 40 MG capsule Take 40 mg by mouth daily.      . sitaGLIPtin (JANUVIA) 100 MG tablet Take 1 tablet (100 mg total) by mouth daily.  30 tablet  3    Results for orders placed during the hospital encounter of 03/20/14 (from the past 48 hour(s))  GLUCOSE, CAPILLARY     Status: Abnormal   Collection Time    03/20/14 12:47 PM      Result Value Ref Range   Glucose-Capillary 118 (*) 70 - 99 mg/dL   Comment 1 Documented in Chart     No results found.  ROS  Blood pressure 151/80, pulse 73, temperature 98.3 F (36.8 C), temperature source Oral, resp. rate 18, height 5\' 11"  (1.803 m), weight 204 lb (92.534 kg), SpO2 94.00%. Physical Exam  Constitutional: She appears well-developed and well-nourished.  HENT:  Mouth/Throat: Oropharynx is clear and moist.  Eyes: Conjunctivae are normal. No scleral icterus.  Neck: No thyromegaly present.  Cardiovascular: Normal rate, regular rhythm and normal heart sounds.   No murmur heard. Respiratory: Effort normal and breath sounds  normal.  GI: Soft. She exhibits no distension and no mass. There is no tenderness.  Musculoskeletal: She exhibits no edema.  Lymphadenopathy:    She has no cervical adenopathy.  Neurological: She is alert.  Skin: Skin is warm and dry.     Assessment/Plan History of melena. Chronic NSAID use. Diagnostic EGD.  Lukasz Rogus U Shellby Schlink 03/20/2014, 1:26 PM

## 2014-03-20 NOTE — Discharge Instructions (Signed)
Discontinue omeprazole, Naprosyn and aspirin. Resume other medications. Pantoprazole 40 mg by mouth every minutes before breakfast and evening meal daily. Tramadol 50 mg by mouth twice daily as needed for pain. And resume aspirin at 81 mg daily after 2 weeks. Call if you have tarry stools again. No driving for 24 hours. Physician will call with results of blood test and biopsy. Office visit in 8 weeks. Esophagogastroduodenoscopy Care After Refer to this sheet in the next few weeks. These instructions provide you with information on caring for yourself after your procedure. Your caregiver may also give you more specific instructions. Your treatment has been planned according to current medical practices, but problems sometimes occur. Call your caregiver if you have any problems or questions after your procedure.  HOME CARE INSTRUCTIONS  Do not eat or drink anything until the numbing medicine (local anesthetic) has worn off and your gag reflex has returned. You will know that the local anesthetic has worn off when you can swallow comfortably.  Do not drive for 12 hours after the procedure or as directed by your caregiver.  Only take medicines as directed by your caregiver. SEEK MEDICAL CARE IF:   You cannot stop coughing.  You are not urinating at all or less than usual. SEEK IMMEDIATE MEDICAL CARE IF:  You have difficulty swallowing.  You cannot eat or drink.  You have worsening throat or chest pain.  You have dizziness, lightheadedness, or you faint.  You have nausea or vomiting.  You have chills.  You have a fever.  You have severe abdominal pain.  You have black, tarry, or bloody stools.

## 2014-03-20 NOTE — Op Note (Signed)
EGD PROCEDURE REPORT  PATIENT:  Miranda Sampson  MR#:  [id number] Birthdate:  01/29/1951, 63 y.o., female Endoscopist:  Dr. Malissa HippoNajeeb U. Amaro Mangold, MD Referred By:  Dr. Milinda AntisKawanta [place], MD  Procedure Date: 03/20/2014  Procedure:   EGD  Indications:  Patient is 63 year old African female who presents with history of melena. Patient takes aspirin 325 mg daily and Naprosyn 500 mg 22 times a day. She is status post right, colect[facility] details of which are not available(no luck with care everywhere). Her last colonoscopy was in August 2010 with with removal of small polyp which was hyperplastic.            Informed Consent:  The risks, benefits, alternatives & imponderables which include, but are not limited to, bleeding, infection, perforation, drug reaction and potential missed lesion have been reviewed.  The potential for biopsy, lesion removal, esophageal dilation, etc. have also been discussed.  Questions have been answered.  All parties agreeable.  Please see history & physical in medical record for more information.  Medications:  Demerol 50 mg IV Versed 5 mg IV Cetacaine spray topically for oropharyngeal anesthesia  Description of procedure:  The endoscope was introduced through the mouth and advanced to the second portion of the duodenum without difficulty or limitations. The mucosal surfaces were surveyed very carefully during advancement of the scope and upon withdrawal.  Findings:  Esophagus:  Mucosa of the esophagus was normal. The GE junction was serrated or wavy.  GEJ:  37 cm Hiatus:  39 cm Stomach:  Stomach was empty and distended very well with insufflation. Folds in the proximal stomach were normal. Examination of mucosa at gastric body was normal. Scattered erosions noted at antrum along with erythema to mucosa of pyloric channel. However it was patent. Angularis fundus and cardia are unremarkable. Duodenum:  3 mm bulbar ulcer noted along with few  erosions. Post bulbar mucosa was normal.  Therapeutic/Diagnostic Maneuvers Performed:  Biopsy taken from the GE junction to rule out short segment Barrett's esophagus.  Complications:  None  Impression: Small sliding hiatal hernia with serrated GE junction. Biopsy taken to rule out short segment Barrett's. Erosive antral gastritis. Small bulbar ulcer.  Comment; These findings suggest patient has been losing blood from upper GI tract.   Recommendations:  H. pylori serology. Discontinue omeprazole. Pantoprazole 40 mg by mouth twice a day. Discontinue Naprosyn. Tramadol 50 mg by mouth twice a day when necessary. No aspirin for two weeks after which she could take 81 mg daily. She would also check with Dr. Jeanice Limurham about taking low-dose aspirin instead of full dose and discuss treatment options for her pain other than NSAIDS. [facility]. Office visit in 8 weeks.  Malissa Hippoajeeb U Viviann Broyles  03/20/2014  1:52 PM  CC: Dr. Milinda AntisURHAM, KAWANTA, MD & Dr. Bonnetta BarryNo ref. provider found

## 2014-03-21 LAB — H. PYLORI ANTIBODY, IGG: H Pylori IgG: 1.06 {ISR} — ABNORMAL HIGH

## 2014-03-24 ENCOUNTER — Telehealth (INDEPENDENT_AMBULATORY_CARE_PROVIDER_SITE_OTHER): Payer: Self-pay | Admitting: *Deleted

## 2014-03-24 DIAGNOSIS — A048 Other specified bacterial intestinal infections: Secondary | ICD-10-CM

## 2014-03-24 NOTE — Telephone Encounter (Signed)
Per Dr.Rehman the patient will need to stool collected.

## 2014-03-25 ENCOUNTER — Encounter (HOSPITAL_COMMUNITY): Payer: Self-pay | Admitting: Internal Medicine

## 2014-05-07 ENCOUNTER — Ambulatory Visit (INDEPENDENT_AMBULATORY_CARE_PROVIDER_SITE_OTHER): Payer: Medicaid Other | Admitting: Family Medicine

## 2014-05-07 ENCOUNTER — Encounter: Payer: Self-pay | Admitting: Family Medicine

## 2014-05-07 VITALS — BP 148/86 | HR 64 | Temp 97.9°F | Resp 20 | Ht 69.5 in | Wt 200.0 lb

## 2014-05-07 DIAGNOSIS — I10 Essential (primary) hypertension: Secondary | ICD-10-CM

## 2014-05-07 DIAGNOSIS — E785 Hyperlipidemia, unspecified: Secondary | ICD-10-CM

## 2014-05-07 DIAGNOSIS — E119 Type 2 diabetes mellitus without complications: Secondary | ICD-10-CM

## 2014-05-07 DIAGNOSIS — IMO0002 Reserved for concepts with insufficient information to code with codable children: Secondary | ICD-10-CM

## 2014-05-07 DIAGNOSIS — M1710 Unilateral primary osteoarthritis, unspecified knee: Secondary | ICD-10-CM

## 2014-05-07 LAB — LIPID PANEL
Cholesterol: 133 mg/dL (ref 0–200)
HDL: 50 mg/dL (ref >39)
LDL Cholesterol: 70 mg/dL (ref 0–99)
Total CHOL/HDL Ratio: 2.7 Ratio
Triglycerides: 67 mg/dL (ref <150)
VLDL: 13 mg/dL (ref 0–40)

## 2014-05-07 LAB — COMPREHENSIVE METABOLIC PANEL
ALT: <8 U/L (ref 0–35)
AST: 11 U/L (ref 0–37)
Albumin: 3.6 g/dL (ref 3.5–5.2)
Alkaline Phosphatase: 87 U/L (ref 39–117)
BUN: 21 mg/dL (ref 6–23)
CO2: 28 mEq/L (ref 19–32)
Calcium: 9.3 mg/dL (ref 8.4–10.5)
Chloride: 105 mEq/L (ref 96–112)
Creat: 0.76 mg/dL (ref 0.50–1.10)
Glucose, Bld: 127 mg/dL — ABNORMAL HIGH (ref 70–99)
Potassium: 4.2 mEq/L (ref 3.5–5.3)
Sodium: 140 mEq/L (ref 135–145)
Total Bilirubin: 0.5 mg/dL (ref 0.2–1.2)
Total Protein: 7 g/dL (ref 6.0–8.3)

## 2014-05-07 LAB — CBC WITH DIFFERENTIAL/PLATELET
Basophils Absolute: 0.1 10*3/uL (ref 0.0–0.1)
Basophils Relative: 1 % (ref 0–1)
Eosinophils Absolute: 0.1 10*3/uL (ref 0.0–0.7)
Eosinophils Relative: 1 % (ref 0–5)
HCT: 42.9 % (ref 36.0–46.0)
Hemoglobin: 14.7 g/dL (ref 12.0–15.0)
Lymphocytes Relative: 16 % (ref 12–46)
Lymphs Abs: 1.2 10*3/uL (ref 0.7–4.0)
MCH: 32.7 pg (ref 26.0–34.0)
MCHC: 34.3 g/dL (ref 30.0–36.0)
MCV: 95.5 fL (ref 78.0–100.0)
Monocytes Absolute: 0.6 10*3/uL (ref 0.1–1.0)
Monocytes Relative: 8 % (ref 3–12)
Neutro Abs: 5.8 10*3/uL (ref 1.7–7.7)
Neutrophils Relative %: 74 % (ref 43–77)
Platelets: 277 10*3/uL (ref 150–400)
RBC: 4.49 MIL/uL (ref 3.87–5.11)
RDW: 13.5 % (ref 11.5–15.5)
WBC: 7.8 10*3/uL (ref 4.0–10.5)

## 2014-05-07 LAB — HEMOGLOBIN A1C
Hgb A1c MFr Bld: 7.4 % — ABNORMAL HIGH (ref <5.7)
Mean Plasma Glucose: 166 mg/dL — ABNORMAL HIGH (ref <117)

## 2014-05-07 MED ORDER — HYDROCODONE-ACETAMINOPHEN 5-325 MG PO TABS: 1.0000 | ORAL_TABLET | Freq: Two times a day (BID) | ORAL | Status: DC | PRN

## 2014-05-07 MED ORDER — TRAMADOL HCL 50 MG PO TABS: 50.0000 mg | ORAL_TABLET | Freq: Two times a day (BID) | ORAL | Status: DC | PRN

## 2014-05-07 MED ORDER — LOSARTAN POTASSIUM-HCTZ 50-12.5 MG PO TABS: 1.0000 | ORAL_TABLET | Freq: Every day | ORAL | Status: DC

## 2014-05-07 NOTE — Patient Instructions (Signed)
Do not pick up any medications until we call you We will call with lab results Pain medications refilled F/U 3 months  Knee Injection Joint injections are shots. Your caregiver will place a needle into your knee joint. The needle is used to put medicine into the joint. These shots can be used to help treat different painful knee conditions such as osteoarthritis, bursitis, local flare-ups of rheumatoid arthritis, and pseudogout. Anti-inflammatory medicines such as corticosteroids and anesthetics are the most common medicines used for joint and soft tissue injections.  PROCEDURE  The skin over the kneecap will be cleaned with an antiseptic solution.  Your caregiver will inject a small amount of a local anesthetic (a medicine like Novocaine) just under the skin in the area that was cleaned.  After the area becomes numb, a second injection is done. This second injection usually includes an anesthetic and an anti-inflammatory medicine called a steroid or cortisone. The needle is carefully placed in between the kneecap and the knee, and the medicine is injected into the joint space.  After the injection is done, the needle is removed. Your caregiver may place a bandage over the injection site. The whole procedure takes no more than a couple of minutes. BEFORE THE PROCEDURE  Wash all of the skin around the entire knee area. Try to remove any loose, scaling skin. There is no other specific preparation necessary unless advised otherwise by your caregiver. LET YOUR CAREGIVER KNOW ABOUT:   Allergies.  Medications taken including herbs, eye drops, over the counter medications, and creams.  Use of steroids (by mouth or creams).  Possible pregnancy, if applicable.  Previous problems with anesthetics or Novocaine.  History of blood clots (thrombophlebitis).  History of bleeding or blood problems.  Previous surgery.  Other health problems. RISKS AND COMPLICATIONS Side effects from cortisone  shots are rare. They include:   Slight bruising of the skin.  Shrinkage of the normal fatty tissue under the skin where the shot was given.  Increase in pain after the shot.  Infection.  Weakening of tendons or tendon rupture.  Allergic reaction to the medicine.  Diabetics may have a temporary increase in their blood sugar after a shot.  Cortisone can temporarily weaken the immune system. While receiving these shots, you should not get certain vaccines. Also, avoid contact with anyone who has chickenpox or measles. Especially if you have never had these diseases or have not been previously immunized. Your immune system may not be strong enough to fight off the infection while the cortisone is in your system. AFTER THE PROCEDURE   You can go home after the procedure.  You may need to put ice on the joint 15-20 minutes every 3 or 4 hours until the pain goes away.  You may need to put an elastic bandage on the joint. HOME CARE INSTRUCTIONS   Only take over-the-counter or prescription medicines for pain, discomfort, or fever as directed by your caregiver.  You should avoid stressing the joint. Unless advised otherwise, avoid activities that put a lot of pressure on a knee joint, such as:  Jogging.  Bicycling.  Recreational climbing.  Hiking.  Laying down and elevating the leg/knee above the level of your heart can help to minimize swelling. SEEK MEDICAL CARE IF:   You have repeated or worsening swelling.  There is drainage from the puncture area.  You develop red streaking that extends above or below the site where the needle was inserted. SEEK IMMEDIATE MEDICAL CARE IF:  You develop a fever.  You have pain that gets worse even though you are taking pain medicine.  The area is red and warm, and you have trouble moving the joint. MAKE SURE YOU:   Understand these instructions.  Will watch your condition.  Will get help right away if you are not doing well or get  worse. Document Released: 02/19/2007 Document Revised: 02/20/2012 Document Reviewed: 11/16/2007 [facility] Patient Information 2014 [employer] [place] [place].

## 2014-05-07 NOTE — Progress Notes (Signed)
Patient ID: Miranda Sampson, female   DOB: [birth year], 63 y.o.   MRN: [id number]   Subjective:    Patient ID: Miranda Sampson, female    DOB: [birth year], 63 y.o.   MRN: [id number]  Patient presents for 3 month F/U- DM, HTN  patient here to follow chronic medical problems. History diabetes mellitus her blood sugar maximum 150 she has had one low blood sugar in the 60s. Her last A1c was 8.1%. She was started on Januvia at her last visit but she never picked up an actual prescription for this she took the samples and when those ran out she stopped taking it which was about a month ago. Hypertension she was on lisinopril however this is causing the cough she was given Benicar samples which she did well with however insurance did not cover the Benicar somehow she was given another prescription for the lisinopril.  Chronic knee pain secondary to osteoarthritis she requests a knee injection in her right knee states it is been giving her more trouble she's had injection in the past which helped. She's not had any recent injury swelling is on and off.    Review Of Systems:  GEN- denies fatigue, fever, weight loss,weakness, recent illness HEENT- denies eye drainage, change in vision, nasal discharge, CVS- denies chest pain, palpitations RESP- denies SOB, cough, wheeze ABD- denies N/V, change in stools, abd pain GU- denies dysuria, hematuria, dribbling, incontinence MSK- + joint pain, muscle aches, injury Neuro- denies headache, dizziness, syncope, seizure activity       Objective:    BP 148/86  Pulse 64  Temp(Src) 97.9 F (36.6 C) (Oral)  Resp 20  Ht 5' 9.5" (1.765 m)  Wt 200 lb (90.719 kg)  BMI 29.12 kg/m2 GEN- NAD, alert and oriented x3 HEENT- PERRL, EOMI, non injected sclera, pink conjunctiva, MMM, oropharynx clear CVS- RRR, no murmur RESP-CTAB MSK-   Normal inspection, no effusion, Fair ROM bilat, +crepitus bilat EXT- No edema Pulses- Radial, DP- 2+   Procedure- Right Knee Steroid  Injection Procedure explained to patient questions answered benefits and risks discussed verbal consent obtained. Antiseptic-ETOH Anesthesia-lidocaine 1%  2cc mixed with  Kenalog 40mg , Marcaine 2cc Minimal blood loss,  Patient tolerated procedure well Bandage applied        Assessment & Plan:      Problem List Items Addressed This Visit   KNEE, ARTHRITIS, DEGEN./OSTEO - Primary     S/p Right knee injection Hydrocodone refilled    Relevant Medications      HYDROcodone-acetaminophen (NORCO/VICODIN) 5-325 MG per tablet      traMADol (ULTRAM) 50 MG tablet   Hyperlipidemia   Relevant Medications      LOSARTAN POTASSIUM-HCTZ 50-12.5 MG PO TABS   Other Relevant Orders      Lipid panel (Completed)   Essential hypertension, benign     I will change it to losartan hydrochlorothiazide for her blood pressure    Relevant Medications      LOSARTAN POTASSIUM-HCTZ 50-12.5 MG PO TABS   Diabetes mellitus     Check A1C, continue meds, change to losartan due to price, needs statin f/u cholesterol    Relevant Medications      LOSARTAN POTASSIUM-HCTZ 50-12.5 MG PO TABS   Other Relevant Orders      CBC with Differential (Completed)      Comprehensive metabolic panel (Completed)      Hemoglobin A1c (Completed)      HM DIABETES FOOT EXAM (Completed)      Note:  This dictation was prepared with Dragon dictation along with smaller phrase technology. Any transcriptional errors that result from this process are unintentional.

## 2014-05-08 NOTE — Assessment & Plan Note (Signed)
S/p Right knee injection Hydrocodone refilled

## 2014-05-08 NOTE — Assessment & Plan Note (Signed)
I will change it to losartan hydrochlorothiazide for her blood pressure

## 2014-05-08 NOTE — Assessment & Plan Note (Signed)
Check A1C, continue meds, change to losartan due to price, needs statin f/u cholesterol

## 2014-06-23 ENCOUNTER — Ambulatory Visit (INDEPENDENT_AMBULATORY_CARE_PROVIDER_SITE_OTHER): Payer: Medicaid Other | Admitting: Internal Medicine

## 2014-06-27 ENCOUNTER — Telehealth (INDEPENDENT_AMBULATORY_CARE_PROVIDER_SITE_OTHER): Payer: Self-pay | Admitting: *Deleted

## 2014-06-27 ENCOUNTER — Encounter (INDEPENDENT_AMBULATORY_CARE_PROVIDER_SITE_OTHER): Payer: Self-pay | Admitting: *Deleted

## 2014-06-27 NOTE — Telephone Encounter (Signed)
Miranda Sampson No Showed for her apt with Dorene Arerri Setzer, NP on 06/23/14. A No Show letter has been mailed.

## 2014-07-25 ENCOUNTER — Telehealth: Payer: Self-pay | Admitting: *Deleted

## 2014-07-25 MED ORDER — HYDROCODONE-ACETAMINOPHEN 5-325 MG PO TABS: 1.0000 | ORAL_TABLET | Freq: Two times a day (BID) | ORAL | Status: DC | PRN

## 2014-07-25 NOTE — Telephone Encounter (Signed)
ok 

## 2014-07-25 NOTE — Telephone Encounter (Signed)
Prescription printed and patient made aware to come to office to pick up.  

## 2014-07-25 NOTE — Telephone Encounter (Signed)
Received call from patient requesting refill on hydrocodone.   Ok to refill??  Last office visit/ refill 05/07/2014.

## 2014-07-30 ENCOUNTER — Ambulatory Visit: Payer: Medicaid Other | Admitting: Family Medicine

## 2014-09-15 ENCOUNTER — Ambulatory Visit (INDEPENDENT_AMBULATORY_CARE_PROVIDER_SITE_OTHER): Payer: Medicare Other | Admitting: Family Medicine

## 2014-09-15 ENCOUNTER — Encounter: Payer: Self-pay | Admitting: Family Medicine

## 2014-09-15 VITALS — BP 136/72 | HR 78 | Temp 98.1°F | Resp 16 | Ht 69.0 in | Wt 198.0 lb

## 2014-09-15 DIAGNOSIS — I10 Essential (primary) hypertension: Secondary | ICD-10-CM

## 2014-09-15 DIAGNOSIS — H04129 Dry eye syndrome of unspecified lacrimal gland: Secondary | ICD-10-CM | POA: Insufficient documentation

## 2014-09-15 DIAGNOSIS — E119 Type 2 diabetes mellitus without complications: Secondary | ICD-10-CM

## 2014-09-15 DIAGNOSIS — M1710 Unilateral primary osteoarthritis, unspecified knee: Secondary | ICD-10-CM

## 2014-09-15 DIAGNOSIS — H04123 Dry eye syndrome of bilateral lacrimal glands: Secondary | ICD-10-CM | POA: Diagnosis not present

## 2014-09-15 DIAGNOSIS — L609 Nail disorder, unspecified: Secondary | ICD-10-CM | POA: Diagnosis not present

## 2014-09-15 DIAGNOSIS — Z23 Encounter for immunization: Secondary | ICD-10-CM

## 2014-09-15 DIAGNOSIS — M179 Osteoarthritis of knee, unspecified: Secondary | ICD-10-CM | POA: Diagnosis not present

## 2014-09-15 DIAGNOSIS — IMO0002 Reserved for concepts with insufficient information to code with codable children: Secondary | ICD-10-CM

## 2014-09-15 LAB — HEMOGLOBIN A1C, FINGERSTICK: Hgb A1C (fingerstick): 7.2 % — ABNORMAL HIGH (ref <5.7)

## 2014-09-15 MED ORDER — HYDROCODONE-ACETAMINOPHEN 5-325 MG PO TABS: 1.0000 | ORAL_TABLET | Freq: Two times a day (BID) | ORAL | Status: DC | PRN

## 2014-09-15 NOTE — Assessment & Plan Note (Signed)
Over-the-counter drops for dry eye also advised her to make appointment with eye Dr.

## 2014-09-15 NOTE — Assessment & Plan Note (Addendum)
A1c was done prior to the knee injections to ensure that her diabetes was improving her A1c was 7.2% she will continue the Januvia and the Glucovance

## 2014-09-15 NOTE — Assessment & Plan Note (Signed)
Status post knee injection in the office also refilled her pain medication

## 2014-09-15 NOTE — Progress Notes (Signed)
Patient ID: Miranda Sampson, female   DOB: 03/18/1951, 63 y.o.   MRN: [id number]   Subjective:    Patient ID: Miranda Sampson, female    DOB: 09/24/1951, 63 y.o.   MRN: [id number]  Patient presents for 3 month F/U and Knee Injection  Patient  Here follow chronic medical problems. DM-  She states she's taken her medication as prescribed no hypoglycemia symptoms no polyuria polyphasia. She does not have her meter with her today states that her blood sugars have been 160 to 170s.last A1c was 7.4%   she requested a steroid shot into her left knee she is known osteoarthritis of both knees with this there is sharp the right knee at the last visit 3 months ago.   She also complains of dry eye and an irritated feeling in her eyes. She's not been to the eye Dr.  Was refill on her pain medication  Review Of Systems:  GEN- denies fatigue, fever, weight loss,weakness, recent illness HEENT- denies eye drainage, change in vision, nasal discharge, CVS- denies chest pain, palpitations RESP- denies SOB, cough, wheeze ABD- denies N/V, change in stools, abd pain GU- denies dysuria, hematuria, dribbling, incontinence MSK- + joint pain, muscle aches, injury Neuro- denies headache, dizziness, syncope, seizure activity       Objective:    BP 136/72  Pulse 78  Temp(Src) 98.1 F (36.7 C) (Oral)  Resp 16  Ht 5\' 9"  (1.753 m)  Wt 198 lb (89.812 kg)  BMI 29.23 kg/m2 GEN- NAD, alert and oriented x3 HEENT- PERRL, EOMI, non injected sclera, pink conjunctiva, MMM, oropharynx clear CVS- RRR, no murmur RESP-CTAB ABD-NABS,soft,NT,ND MSK-   Normal inspection, no effusion, Fair ROM bilat, +crepitus bilat EXT- No edema Pulses- Radial, DP- 2+   Procedure- Left Knee Steroid Injection Procedure explained to patient questions answered benefits and risks discussed verbal consent obtained. Antiseptic-ETOH Anesthesia-lidocaine 1%  2cc mixed with  Kenalog 40mg , Marcaine 2cc Minimal blood loss,  Patient tolerated  procedure well Bandage applied      Assessment & Plan:      Problem List Items Addressed This Visit   Osteoarthrosis, unspecified whether generalized or localized, involving lower leg     Status post knee injection in the office also refilled her pain medication    Relevant Medications      HYDROcodone-acetaminophen (NORCO/VICODIN) 5-325 MG per tablet   Other Relevant Orders      Hemoglobin A1C, fingerstick (Completed)      Flu Vaccine QUAD 36+ mos PF IM (Fluarix Quad PF) (Completed)   Essential hypertension, benign     Blood pressure looks good no change in medication    Relevant Medications      HYDROcodone-acetaminophen (NORCO/VICODIN) 5-325 MG per tablet   Other Relevant Orders      Hemoglobin A1C, fingerstick (Completed)      Flu Vaccine QUAD 36+ mos PF IM (Fluarix Quad PF) (Completed)   Diabetes mellitus - Primary     A1c was done prior to the knee injections to ensure that her diabetes was improving her A1c was 7.2% she will continue the Januvia and the Glucovance    Relevant Medications      HYDROcodone-acetaminophen (NORCO/VICODIN) 5-325 MG per tablet   Other Relevant Orders      Hemoglobin A1C, fingerstick (Completed)      Flu Vaccine QUAD 36+ mos PF IM (Fluarix Quad PF) (Completed)    Other Visit Diagnoses   Need for prophylactic vaccination and inoculation against influenza  Relevant Medications       HYDROcodone-acetaminophen (NORCO/VICODIN) 5-325 MG per tablet    Other Relevant Orders       Hemoglobin A1C, fingerstick (Completed)       Flu Vaccine QUAD 36+ mos PF IM (Fluarix Quad PF) (Completed)       Note: This dictation was prepared with Dragon dictation along with smaller phrase technology. Any transcriptional errors that result from this process are unintentional.

## 2014-09-15 NOTE — Assessment & Plan Note (Signed)
Blood pressure looks good no change in medication 

## 2014-09-15 NOTE — Addendum Note (Signed)
Addended by: Milinda AntisURHAM, Edana Aguado F on: 09/15/2014 06:06 PM   Modules accepted: Orders

## 2014-09-15 NOTE — Patient Instructions (Addendum)
Get [facility]- Dry eye artificial tears over the counter Referral to podiatrist Flu shot given  Diabetic shoes  F/U  3 months

## 2014-09-24 ENCOUNTER — Other Ambulatory Visit: Payer: Self-pay | Admitting: Family Medicine

## 2014-09-24 NOTE — Telephone Encounter (Signed)
Refill appropriate and filled per protocol. 

## 2014-10-01 ENCOUNTER — Ambulatory Visit: Payer: Medicaid Other | Admitting: Podiatrist

## 2014-10-17 ENCOUNTER — Other Ambulatory Visit: Payer: Self-pay | Admitting: Family Medicine

## 2014-10-17 ENCOUNTER — Telehealth: Payer: Self-pay | Admitting: Family Medicine

## 2014-10-17 DIAGNOSIS — M1710 Unilateral primary osteoarthritis, unspecified knee: Secondary | ICD-10-CM

## 2014-10-17 DIAGNOSIS — I10 Essential (primary) hypertension: Secondary | ICD-10-CM

## 2014-10-17 DIAGNOSIS — E119 Type 2 diabetes mellitus without complications: Secondary | ICD-10-CM

## 2014-10-17 DIAGNOSIS — IMO0002 Reserved for concepts with insufficient information to code with codable children: Secondary | ICD-10-CM

## 2014-10-17 DIAGNOSIS — Z23 Encounter for immunization: Secondary | ICD-10-CM

## 2014-10-17 MED ORDER — HYDROCODONE-ACETAMINOPHEN 5-325 MG PO TABS: 1.0000 | ORAL_TABLET | Freq: Two times a day (BID) | ORAL | Status: DC | PRN

## 2014-10-17 NOTE — Telephone Encounter (Signed)
[phone]  Pt is needing a refill on her      HYDROcodone-acetaminophen (NORCO/VICODIN) 5-325 MG per tablet

## 2014-10-17 NOTE — Telephone Encounter (Signed)
Ok to refill??  Last office visit/ refill 09/15/2014. 

## 2014-10-17 NOTE — Telephone Encounter (Signed)
Prescription printed and patient made aware to come to office to pick up.  

## 2014-10-17 NOTE — Telephone Encounter (Signed)
Okay to refill? 

## 2014-10-21 NOTE — Telephone Encounter (Signed)
Refill appropriate and filled per protocol. 

## 2014-11-11 ENCOUNTER — Telehealth: Payer: Self-pay | Admitting: Family Medicine

## 2014-11-11 DIAGNOSIS — I10 Essential (primary) hypertension: Secondary | ICD-10-CM

## 2014-11-11 DIAGNOSIS — IMO0002 Reserved for concepts with insufficient information to code with codable children: Secondary | ICD-10-CM

## 2014-11-11 DIAGNOSIS — Z23 Encounter for immunization: Secondary | ICD-10-CM

## 2014-11-11 DIAGNOSIS — M1710 Unilateral primary osteoarthritis, unspecified knee: Secondary | ICD-10-CM

## 2014-11-11 DIAGNOSIS — E119 Type 2 diabetes mellitus without complications: Secondary | ICD-10-CM

## 2014-11-11 NOTE — Telephone Encounter (Signed)
Ok to refill??  Last office visit 10/16/2014.  Last refill 10/17/2014.

## 2014-11-11 NOTE — Telephone Encounter (Signed)
Patient calling to get rx for her hydrocodone  [phone]

## 2014-11-11 NOTE — Telephone Encounter (Signed)
Okay to refill? 

## 2014-11-12 MED ORDER — HYDROCODONE-ACETAMINOPHEN 5-325 MG PO TABS: 1.0000 | ORAL_TABLET | Freq: Two times a day (BID) | ORAL | Status: DC | PRN

## 2014-11-12 NOTE — Telephone Encounter (Signed)
Prescription printed and patient made aware to come to office to pick up.  

## 2014-12-03 ENCOUNTER — Telehealth: Payer: Self-pay | Admitting: *Deleted

## 2014-12-03 MED ORDER — GLIPIZIDE 10 MG PO TABS: 10.0000 mg | ORAL_TABLET | Freq: Two times a day (BID) | ORAL | Status: DC

## 2014-12-03 MED ORDER — METFORMIN HCL 1000 MG PO TABS: 1000.0000 mg | ORAL_TABLET | Freq: Two times a day (BID) | ORAL | Status: DC

## 2014-12-03 NOTE — Telephone Encounter (Signed)
Received mail from [employer]. Advised that Glucovance will no longer be covered.   MD made aware and new orders obtained: Complete glucovance supply, then begin Metformin 100mg  (1) tab PO BID and Glipizide 10mg  (1) tab PO BID.   Call placed to patient and patient made aware.   Mediation list updated.   Pharmacy made aware.

## 2014-12-08 ENCOUNTER — Other Ambulatory Visit: Payer: Self-pay | Admitting: *Deleted

## 2014-12-08 DIAGNOSIS — IMO0002 Reserved for concepts with insufficient information to code with codable children: Secondary | ICD-10-CM

## 2014-12-08 DIAGNOSIS — E119 Type 2 diabetes mellitus without complications: Secondary | ICD-10-CM

## 2014-12-08 DIAGNOSIS — Z23 Encounter for immunization: Secondary | ICD-10-CM

## 2014-12-08 DIAGNOSIS — M1710 Unilateral primary osteoarthritis, unspecified knee: Secondary | ICD-10-CM

## 2014-12-08 DIAGNOSIS — I10 Essential (primary) hypertension: Secondary | ICD-10-CM

## 2014-12-08 MED ORDER — HYDROCODONE-ACETAMINOPHEN 5-325 MG PO TABS
1.0000 | ORAL_TABLET | Freq: Two times a day (BID) | ORAL | Status: DC | PRN
Start: 2014-12-08 — End: 2015-01-21

## 2014-12-08 NOTE — Telephone Encounter (Signed)
Records indicate prescription refill appropriate for Norco.   Prescription printed and patient made aware to come to office to pick up.

## 2014-12-16 ENCOUNTER — Ambulatory Visit: Payer: Medicaid Other | Admitting: Family Medicine

## 2014-12-23 ENCOUNTER — Ambulatory Visit: Payer: Medicaid Other | Admitting: Family Medicine

## 2015-01-06 ENCOUNTER — Other Ambulatory Visit (INDEPENDENT_AMBULATORY_CARE_PROVIDER_SITE_OTHER): Payer: Self-pay | Admitting: Internal Medicine

## 2015-01-06 ENCOUNTER — Other Ambulatory Visit: Payer: Self-pay | Admitting: Family Medicine

## 2015-01-06 NOTE — Telephone Encounter (Signed)
Refill appropriate and filled per protocol. 

## 2015-01-15 ENCOUNTER — Other Ambulatory Visit: Payer: Self-pay | Admitting: Family Medicine

## 2015-01-15 DIAGNOSIS — M1710 Unilateral primary osteoarthritis, unspecified knee: Secondary | ICD-10-CM

## 2015-01-16 ENCOUNTER — Ambulatory Visit: Payer: Medicaid Other | Admitting: Family Medicine

## 2015-01-16 ENCOUNTER — Telehealth: Payer: Self-pay | Admitting: Family Medicine

## 2015-01-16 NOTE — Telephone Encounter (Signed)
Pt needs appt, too many no shows before prescription refilled Note she called in for script 10 minutes before appt and then did not show up

## 2015-01-16 NOTE — Telephone Encounter (Signed)
Patient needs refill on hydrocodone  [phone]

## 2015-01-16 NOTE — Telephone Encounter (Signed)
Call placed to patient and patient made aware.   Appointment scheduled for Wed 2/10 at 2:30pm.

## 2015-01-16 NOTE — Telephone Encounter (Signed)
Patient scheduled for appointment on 01/16/2015 @ 11:30am.   Patient has NS on 12/23/2014, 12/16/2014, and 07/30/2014.  MD please advise.

## 2015-01-19 ENCOUNTER — Encounter: Payer: Self-pay | Admitting: *Deleted

## 2015-01-21 ENCOUNTER — Ambulatory Visit (INDEPENDENT_AMBULATORY_CARE_PROVIDER_SITE_OTHER): Payer: 59 | Admitting: Family Medicine

## 2015-01-21 ENCOUNTER — Encounter: Payer: Self-pay | Admitting: Family Medicine

## 2015-01-21 VITALS — BP 138/84 | HR 78 | Temp 98.3°F | Resp 18 | Ht 69.0 in | Wt 191.0 lb

## 2015-01-21 DIAGNOSIS — E119 Type 2 diabetes mellitus without complications: Secondary | ICD-10-CM | POA: Diagnosis not present

## 2015-01-21 DIAGNOSIS — Z23 Encounter for immunization: Secondary | ICD-10-CM | POA: Diagnosis not present

## 2015-01-21 DIAGNOSIS — E785 Hyperlipidemia, unspecified: Secondary | ICD-10-CM | POA: Diagnosis not present

## 2015-01-21 DIAGNOSIS — M1710 Unilateral primary osteoarthritis, unspecified knee: Secondary | ICD-10-CM

## 2015-01-21 DIAGNOSIS — M179 Osteoarthritis of knee, unspecified: Secondary | ICD-10-CM

## 2015-01-21 DIAGNOSIS — I10 Essential (primary) hypertension: Secondary | ICD-10-CM

## 2015-01-21 DIAGNOSIS — IMO0002 Reserved for concepts with insufficient information to code with codable children: Secondary | ICD-10-CM

## 2015-01-21 LAB — URINALYSIS, MICROSCOPIC ONLY
Casts: NONE SEEN
Crystals: NONE SEEN

## 2015-01-21 LAB — URINALYSIS, ROUTINE W REFLEX MICROSCOPIC
Glucose, UA: NEGATIVE mg/dL
Leukocytes, UA: NEGATIVE
Nitrite: NEGATIVE
Protein, ur: NEGATIVE mg/dL
Specific Gravity, Urine: 1.02 (ref 1.005–1.030)
Urobilinogen, UA: 1 mg/dL (ref 0.0–1.0)
pH: 6 (ref 5.0–8.0)

## 2015-01-21 MED ORDER — HYDROCODONE-ACETAMINOPHEN 5-325 MG PO TABS: 1.0000 | ORAL_TABLET | Freq: Two times a day (BID) | ORAL | Status: DC | PRN

## 2015-01-21 NOTE — Patient Instructions (Addendum)
Pain medication refilled Continue current medications Referral to eye doctor We will call with lab results F/U 3 months for PHYSICAL

## 2015-01-21 NOTE — Progress Notes (Signed)
Patient ID: Miranda DouglasLena B Sampson, female   DOB: 06/24/1951, 64 y.o.   MRN: [id number]   Subjective:    Patient ID: Miranda DouglasLena B Sampson, female    DOB: 03/31/1951, 64 y.o.   MRN: [id number]  Patient presents for 3 month F/U  patient here to follow-up chronic medical problems. She has no specific concerns. She does states that she fell a few weeks ago she was standing on a couch trying to hang some curtains and got a little lightheaded. She's also had some urinary frequency once in no shooting Mr. kidneys. Diabetes mellitus she states her blood sugars have been running in the 130s to 140s fasting she denies any hypoglycemia symptoms. She is taking her medicines as prescribed per her report. Her last A1c was 7.2% Request refill on pain meds for OA    Review Of Systems:  GEN- denies fatigue, fever, weight loss,weakness, recent illness HEENT- denies eye drainage, change in vision, nasal discharge, CVS- denies chest pain, palpitations RESP- denies SOB, cough, wheeze ABD- denies N/V, change in stools, abd pain GU- denies dysuria, hematuria, dribbling, incontinence MSK- +oint pain, muscle aches, injury Neuro- denies headache, dizziness, syncope, seizure activity       Objective:    BP 138/84 mmHg  Pulse 78  Temp(Src) 98.3 F (36.8 C) (Oral)  Resp 18  Ht 5\' 9"  (1.753 m)  Wt 191 lb (86.637 kg)  BMI 28.19 kg/m2 GEN- NAD, alert and oriented x3 HEENT- PERRL, EOMI, non injected sclera, pink conjunctiva, MMM, oropharynx clear CVS- RRR, no murmur RESP-CTAB EXT- No edema Pulses- Radial, DP- 2+        Assessment & Plan:      Problem List Items Addressed This Visit    None      Note: This dictation was prepared with Dragon dictation along with smaller phrase technology. Any transcriptional errors that result from this process are unintentional.

## 2015-01-21 NOTE — Assessment & Plan Note (Signed)
Goal A1C < 7%, recheck labs today UA shows mild protein, advised increase water intake, no sign of infection

## 2015-01-21 NOTE — Assessment & Plan Note (Signed)
Refilled pain meds, OA controlled with this Fall precautions discussed

## 2015-01-21 NOTE — Assessment & Plan Note (Signed)
Well controlled, no change to meds 

## 2015-01-22 LAB — LIPID PANEL
Cholesterol: 135 mg/dL (ref 0–200)
HDL: 46 mg/dL (ref >39)
LDL Cholesterol: 66 mg/dL (ref 0–99)
Total CHOL/HDL Ratio: 2.9 Ratio
Triglycerides: 113 mg/dL (ref <150)
VLDL: 23 mg/dL (ref 0–40)

## 2015-01-22 LAB — CBC WITH DIFFERENTIAL/PLATELET
Basophils Absolute: 0.1 10*3/uL (ref 0.0–0.1)
Basophils Relative: 1 % (ref 0–1)
Eosinophils Absolute: 0.2 10*3/uL (ref 0.0–0.7)
Eosinophils Relative: 4 % (ref 0–5)
HCT: 43.6 % (ref 36.0–46.0)
Hemoglobin: 14.9 g/dL (ref 12.0–15.0)
Lymphocytes Relative: 28 % (ref 12–46)
Lymphs Abs: 1.6 10*3/uL (ref 0.7–4.0)
MCH: 32.9 pg (ref 26.0–34.0)
MCHC: 34.2 g/dL (ref 30.0–36.0)
MCV: 96.2 fL (ref 78.0–100.0)
MPV: 11.1 fL (ref 8.6–12.4)
Monocytes Absolute: 0.4 10*3/uL (ref 0.1–1.0)
Monocytes Relative: 7 % (ref 3–12)
Neutro Abs: 3.5 10*3/uL (ref 1.7–7.7)
Neutrophils Relative %: 60 % (ref 43–77)
Platelets: 262 10*3/uL (ref 150–400)
RBC: 4.53 MIL/uL (ref 3.87–5.11)
RDW: 13.3 % (ref 11.5–15.5)
WBC: 5.8 10*3/uL (ref 4.0–10.5)

## 2015-01-22 LAB — COMPREHENSIVE METABOLIC PANEL
ALT: <8 U/L (ref 0–35)
AST: 11 U/L (ref 0–37)
Albumin: 4 g/dL (ref 3.5–5.2)
Alkaline Phosphatase: 89 U/L (ref 39–117)
BUN: 16 mg/dL (ref 6–23)
CO2: 25 mEq/L (ref 19–32)
Calcium: 9.4 mg/dL (ref 8.4–10.5)
Chloride: 110 mEq/L (ref 96–112)
Creat: 0.58 mg/dL (ref 0.50–1.10)
Glucose, Bld: 59 mg/dL — ABNORMAL LOW (ref 70–99)
Potassium: 4.1 mEq/L (ref 3.5–5.3)
Sodium: 143 mEq/L (ref 135–145)
Total Bilirubin: 0.5 mg/dL (ref 0.2–1.2)
Total Protein: 7.1 g/dL (ref 6.0–8.3)

## 2015-01-22 LAB — HEMOGLOBIN A1C
Hgb A1c MFr Bld: 7.9 % — ABNORMAL HIGH (ref <5.7)
Mean Plasma Glucose: 180 mg/dL — ABNORMAL HIGH (ref <117)

## 2015-02-17 ENCOUNTER — Other Ambulatory Visit: Payer: Self-pay | Admitting: *Deleted

## 2015-02-17 DIAGNOSIS — Z23 Encounter for immunization: Secondary | ICD-10-CM

## 2015-02-17 DIAGNOSIS — E119 Type 2 diabetes mellitus without complications: Secondary | ICD-10-CM

## 2015-02-17 DIAGNOSIS — I10 Essential (primary) hypertension: Secondary | ICD-10-CM

## 2015-02-17 DIAGNOSIS — M1710 Unilateral primary osteoarthritis, unspecified knee: Secondary | ICD-10-CM

## 2015-02-17 DIAGNOSIS — IMO0002 Reserved for concepts with insufficient information to code with codable children: Secondary | ICD-10-CM

## 2015-02-17 MED ORDER — HYDROCODONE-ACETAMINOPHEN 5-325 MG PO TABS: 1.0000 | ORAL_TABLET | Freq: Two times a day (BID) | ORAL | Status: DC | PRN

## 2015-02-17 NOTE — Telephone Encounter (Signed)
Med printed,r eady for provider signature

## 2015-02-17 NOTE — Telephone Encounter (Signed)
Forward to PCP.

## 2015-02-17 NOTE — Telephone Encounter (Signed)
Pt called needing refill on her pain medication hydrocodone 5-325mg   Last refill 01/21/15

## 2015-02-17 NOTE — Telephone Encounter (Signed)
Okay to refill? 

## 2015-02-19 ENCOUNTER — Telehealth: Payer: Self-pay | Admitting: Family Medicine

## 2015-02-19 NOTE — Telephone Encounter (Signed)
[phone]  Pt is needing a refill on HYDROcodone-acetaminophen (NORCO/VICODIN) 5-325 MG per tablet

## 2015-02-19 NOTE — Telephone Encounter (Signed)
Per prior noted on 02/17/2015, prescription has been approved and has been printed.   Please check to see if it is up front to be picked up.

## 2015-03-23 ENCOUNTER — Telehealth: Payer: Self-pay | Admitting: Family Medicine

## 2015-03-23 DIAGNOSIS — Z23 Encounter for immunization: Secondary | ICD-10-CM

## 2015-03-23 DIAGNOSIS — IMO0002 Reserved for concepts with insufficient information to code with codable children: Secondary | ICD-10-CM

## 2015-03-23 DIAGNOSIS — E119 Type 2 diabetes mellitus without complications: Secondary | ICD-10-CM

## 2015-03-23 DIAGNOSIS — I10 Essential (primary) hypertension: Secondary | ICD-10-CM

## 2015-03-23 DIAGNOSIS — M1710 Unilateral primary osteoarthritis, unspecified knee: Secondary | ICD-10-CM

## 2015-03-23 MED ORDER — HYDROCODONE-ACETAMINOPHEN 5-325 MG PO TABS: 1.0000 | ORAL_TABLET | Freq: Two times a day (BID) | ORAL | Status: DC | PRN

## 2015-03-23 NOTE — Telephone Encounter (Signed)
Okay to refill? 

## 2015-03-23 NOTE — Telephone Encounter (Signed)
Prescription printed and patient made aware to come to office to pick up.  

## 2015-03-23 NOTE — Telephone Encounter (Signed)
[phone] °PT is needing a refill on HYDROcodone-acetaminophen (NORCO/VICODIN) 5-325 MG per tablet °

## 2015-03-23 NOTE — Telephone Encounter (Signed)
Ok to refill??  Last office visit 01/21/2015.  Last refill 02/17/2015.

## 2015-03-31 ENCOUNTER — Encounter: Payer: Self-pay | Admitting: *Deleted

## 2015-04-21 ENCOUNTER — Encounter: Payer: Self-pay | Admitting: Family Medicine

## 2015-04-24 ENCOUNTER — Encounter: Payer: Self-pay | Admitting: Family Medicine

## 2015-04-24 ENCOUNTER — Ambulatory Visit: Payer: 59 | Admitting: Family Medicine

## 2015-04-24 ENCOUNTER — Ambulatory Visit (INDEPENDENT_AMBULATORY_CARE_PROVIDER_SITE_OTHER): Payer: Medicare Other | Admitting: Family Medicine

## 2015-04-24 VITALS — BP 136/70 | HR 76 | Temp 98.2°F | Resp 18 | Ht 69.0 in | Wt 188.0 lb

## 2015-04-24 DIAGNOSIS — E119 Type 2 diabetes mellitus without complications: Secondary | ICD-10-CM | POA: Diagnosis not present

## 2015-04-24 DIAGNOSIS — R195 Other fecal abnormalities: Secondary | ICD-10-CM

## 2015-04-24 DIAGNOSIS — Z1211 Encounter for screening for malignant neoplasm of colon: Secondary | ICD-10-CM

## 2015-04-24 DIAGNOSIS — I10 Essential (primary) hypertension: Secondary | ICD-10-CM

## 2015-04-24 DIAGNOSIS — Z0000 Encounter for general adult medical examination without abnormal findings: Secondary | ICD-10-CM | POA: Diagnosis not present

## 2015-04-24 DIAGNOSIS — IMO0002 Reserved for concepts with insufficient information to code with codable children: Secondary | ICD-10-CM

## 2015-04-24 DIAGNOSIS — Z1239 Encounter for other screening for malignant neoplasm of breast: Secondary | ICD-10-CM

## 2015-04-24 DIAGNOSIS — M179 Osteoarthritis of knee, unspecified: Secondary | ICD-10-CM

## 2015-04-24 DIAGNOSIS — Z13820 Encounter for screening for osteoporosis: Secondary | ICD-10-CM

## 2015-04-24 DIAGNOSIS — Z124 Encounter for screening for malignant neoplasm of cervix: Secondary | ICD-10-CM | POA: Diagnosis not present

## 2015-04-24 DIAGNOSIS — Z23 Encounter for immunization: Secondary | ICD-10-CM

## 2015-04-24 DIAGNOSIS — M1710 Unilateral primary osteoarthritis, unspecified knee: Secondary | ICD-10-CM

## 2015-04-24 MED ORDER — HYDROCODONE-ACETAMINOPHEN 5-325 MG PO TABS: 1.0000 | ORAL_TABLET | Freq: Two times a day (BID) | ORAL | Status: DC | PRN

## 2015-04-24 NOTE — Patient Instructions (Addendum)
We will schedule mammogram and bone density Referral to GI for colonoscopy Shingles vaccine sent to pharmacy Prevnar 13 given today We will call with lab results Referral to orthopedics  F/u 4 MONTHS

## 2015-04-24 NOTE — Progress Notes (Signed)
Patient ID: Miranda DouglasLena B Sampson, female   DOB: 08/27/1951, 64 y.o.   MRN: [id number] Subjective:   Patient presents for Medicare Annual/Subsequent preventive examination.    She here for a wellness exam as well as breast and Pap smear. Her only concern is persistent knee pain she has known osteoarthritis. The last steroid injection did not last for only a few days. She will like to be referred to orthopedics to see other options. She is still taking her pain medicine as prescribed. Did not bring her meds with her today but states that her sugars have been good. She is taking medications as prescribed. Her last A1c was 7.9%. Denies any hypoglycemia symptoms. Review Past Medical/Family/Social: PER emr   Risk Factors  Current exercise habits: None Dietary issues discussed: Yes  Cardiac risk factors: HTN, DM  Depression Screen  (Note: if answer to either of the following is "Yes", a more complete depression screening is indicated)  Over the past two weeks, have you felt down, depressed or hopeless? No Over the past two weeks, have you felt little interest or pleasure in doing things? No Have you lost interest or pleasure in daily life? No Do you often feel hopeless? No Do you cry easily over simple problems? No   Activities of Daily Living  In your present state of health, do you have any difficulty performing the following activities?:  Driving? No  Managing money? No  Feeding yourself? No  Getting from bed to chair? Some  Climbing a flight of stairs?Some  Preparing food and eating?: No  Bathing or showering? No  Getting dressed: No  Getting to the toilet? No  Using the toilet:No  Moving around from place to place: No  In the past year have you fallen or had a near fall?:yes Are you sexually active? yes Do you have more than one partner? No   Hearing Difficulties: No  Do you often ask people to speak up or repeat themselves? No  Do you experience ringing or noises in your ears? No Do you  have difficulty understanding soft or whispered voices? yes Do you feel that you have a problem with memory? Some Do you often misplace items? yes Do you feel safe at home? Yes  Cognitive Testing  Alert? Yes Normal Appearance?Yes  Oriented to person? Yes Place? Yes  Time? Yes  Recall of three objects? Yes  Can perform simple calculations? Yes  Displays appropriate judgment?Yes  Can read the correct time from a watch face?Yes   List the Names of Other Physician/Practitioners you currently use:  None  Screening Tests / Date Colonoscopy   - Due                  Zostavax -Due Mammogram - Due Tetanus/tdap- unable insurancce   ROS: GEN- denies fatigue, fever, weight loss,weakness, recent illness HEENT- denies eye drainage, change in vision, nasal discharge, CVS- denies chest pain, palpitations RESP- denies SOB, cough, wheeze ABD- denies N/V, change in stools, abd pain GU- denies dysuria, hematuria, dribbling, incontinence MSK- + joint pain, muscle aches, injury Neuro- denies headache, dizziness, syncope, seizure activity  Physical: GEN- NAD, alert and oriented x3 HEENT- PERRL, EOMI, non injected sclera, pink conjunctiva, MMM, oropharynx clear Neck- Supple, no thryomegaly CVS- RRR, no murmur Breast- normal symmetry, no nipple inversion,no nipple drainage, no nodules or lumps felt Nodes- no axillary nodes RESP-CTAB ABD-NABS,soft,NT,ND GU- normal external genitalia, vaginal mucosa pink and mild atrophy, cervix visualized no growth, no blood form os, no  discharge, no CMT, no ovarian masses, uterus normal size, bladder prolapse Rectum- normal tone, FOBT positive, external tags,soft stool in vault EXT- No edema Pulses- Radial, DP- 2+    Assessment:    Annual wellness medicare exam   Plan:    During the course of the visit the patient was educated and counseled about appropriate screening and preventive services including:  Screening mammography  Colorectal cancer  screening to be done Shingles vaccine. Prescription given to that she can get the vaccine at the pharmacy or Medicare part D.        Prevnar 13 given in office      Dexa scan to be done   Diet review for nutrition referral? Yes ____ Not Indicated __x__  Patient Instructions (the written plan) was given to the patient.  Medicare Attestation  I have personally reviewed:  The patient's medical and social history  Their use of alcohol, tobacco or illicit drugs  Their current medications and supplements  The patient's functional ability including ADLs,fall risks, home safety risks, cognitive, and hearing and visual impairment  Diet and physical activities  Evidence for depression or mood disorders  The patient's weight, height, BMI, and visual acuity have been recorded in the chart. I have made referrals, counseling, and provided education to the patient based on review of the above and I have provided the patient with a written personalized care plan for preventive services.

## 2015-04-25 LAB — COMPREHENSIVE METABOLIC PANEL
ALT: 8 U/L (ref 0–35)
AST: 11 U/L (ref 0–37)
Albumin: 3.7 g/dL (ref 3.5–5.2)
Alkaline Phosphatase: 87 U/L (ref 39–117)
BUN: 13 mg/dL (ref 6–23)
CO2: 25 mEq/L (ref 19–32)
Calcium: 9.5 mg/dL (ref 8.4–10.5)
Chloride: 100 mEq/L (ref 96–112)
Creat: 0.61 mg/dL (ref 0.50–1.10)
Glucose, Bld: 342 mg/dL — ABNORMAL HIGH (ref 70–99)
Potassium: 4.5 mEq/L (ref 3.5–5.3)
Sodium: 135 mEq/L (ref 135–145)
Total Bilirubin: 0.8 mg/dL (ref 0.2–1.2)
Total Protein: 7.3 g/dL (ref 6.0–8.3)

## 2015-04-25 LAB — MICROALBUMIN / CREATININE URINE RATIO
Creatinine, Urine: 60.9 mg/dL
Microalb Creat Ratio: 4.9 mg/g (ref 0.0–30.0)
Microalb, Ur: 0.3 mg/dL (ref <2.0)

## 2015-04-25 LAB — HEMOGLOBIN A1C
Hgb A1c MFr Bld: 10.7 % — ABNORMAL HIGH (ref <5.7)
Mean Plasma Glucose: 260 mg/dL — ABNORMAL HIGH (ref <117)

## 2015-04-25 NOTE — Assessment & Plan Note (Addendum)
Goal is A1C less than 7% Continue oral meds On ACE, not on statin though LDL is at 65 without meds

## 2015-04-25 NOTE — Assessment & Plan Note (Signed)
Well controlling, fasting labs done

## 2015-04-25 NOTE — Assessment & Plan Note (Signed)
Refer back to orthopedics, worsening knee pain, I have given 2 injections, they are no longer helping On Norco as well

## 2015-04-27 LAB — PAP THINPREP ASCUS RFLX HPV RFLX TYPE

## 2015-04-28 ENCOUNTER — Other Ambulatory Visit: Payer: Self-pay | Admitting: *Deleted

## 2015-04-28 MED ORDER — FLUCONAZOLE 150 MG PO TABS: 150.0000 mg | ORAL_TABLET | Freq: Once | ORAL | Status: DC

## 2015-04-29 ENCOUNTER — Other Ambulatory Visit: Payer: Self-pay | Admitting: *Deleted

## 2015-04-29 MED ORDER — BLOOD GLUCOSE TEST VI STRP: ORAL_STRIP | Status: DC

## 2015-04-29 MED ORDER — GLIPIZIDE 10 MG PO TABS: 10.0000 mg | ORAL_TABLET | Freq: Two times a day (BID) | ORAL | Status: DC

## 2015-04-29 MED ORDER — BLOOD GLUCOSE MONITOR SYSTEM W/DEVICE KIT
PACK | Status: DC
Start: 2015-04-29 — End: 2015-09-28

## 2015-04-29 MED ORDER — LANCETS MISC: Status: DC

## 2015-05-01 ENCOUNTER — Other Ambulatory Visit: Payer: Self-pay | Admitting: Family Medicine

## 2015-05-01 ENCOUNTER — Telehealth: Payer: Self-pay | Admitting: *Deleted

## 2015-05-01 DIAGNOSIS — Z1231 Encounter for screening mammogram for malignant neoplasm of breast: Secondary | ICD-10-CM

## 2015-05-01 NOTE — Telephone Encounter (Signed)
Pt has appt scheduled for Mammogram on Mon June 13 at 10am at [facility] and then for her DEXA her app[facility] is [facility] Mon June [facility], pt is aware of appt.

## 2015-05-06 ENCOUNTER — Encounter (INDEPENDENT_AMBULATORY_CARE_PROVIDER_SITE_OTHER): Payer: Self-pay | Admitting: *Deleted

## 2015-05-07 ENCOUNTER — Encounter: Payer: Self-pay | Admitting: Orthopedic Surgery

## 2015-05-20 ENCOUNTER — Ambulatory Visit (INDEPENDENT_AMBULATORY_CARE_PROVIDER_SITE_OTHER): Payer: Medicaid Other | Admitting: Internal Medicine

## 2015-05-25 ENCOUNTER — Ambulatory Visit (HOSPITAL_COMMUNITY): Payer: Medicaid Other

## 2015-05-25 ENCOUNTER — Other Ambulatory Visit (HOSPITAL_COMMUNITY): Payer: Medicaid Other

## 2015-05-25 ENCOUNTER — Telehealth: Payer: Self-pay | Admitting: Family Medicine

## 2015-05-25 DIAGNOSIS — M1710 Unilateral primary osteoarthritis, unspecified knee: Secondary | ICD-10-CM

## 2015-05-25 DIAGNOSIS — I10 Essential (primary) hypertension: Secondary | ICD-10-CM

## 2015-05-25 DIAGNOSIS — IMO0002 Reserved for concepts with insufficient information to code with codable children: Secondary | ICD-10-CM

## 2015-05-25 DIAGNOSIS — Z23 Encounter for immunization: Secondary | ICD-10-CM

## 2015-05-25 DIAGNOSIS — E119 Type 2 diabetes mellitus without complications: Secondary | ICD-10-CM

## 2015-05-25 MED ORDER — HYDROCODONE-ACETAMINOPHEN 5-325 MG PO TABS: 1.0000 | ORAL_TABLET | Freq: Two times a day (BID) | ORAL | Status: DC | PRN

## 2015-05-25 NOTE — Telephone Encounter (Signed)
[phone] PT is needing a refill on HYDROcodone-acetaminophen (NORCO/VICODIN) 5-325 MG per tablet

## 2015-05-25 NOTE — Telephone Encounter (Signed)
Ok to refill??  Last office visit/ refill 04/24/2015. 

## 2015-05-25 NOTE — Telephone Encounter (Signed)
Okay to refill? 

## 2015-05-25 NOTE — Telephone Encounter (Signed)
Prescription printed and patient made aware to come to office to pick up on 05/26/2015.

## 2015-05-29 ENCOUNTER — Other Ambulatory Visit: Payer: Self-pay | Admitting: Family Medicine

## 2015-06-01 NOTE — Telephone Encounter (Signed)
Medication refilled per protocol. 

## 2015-06-10 ENCOUNTER — Other Ambulatory Visit: Payer: Self-pay | Admitting: Family Medicine

## 2015-06-11 MED ORDER — METFORMIN HCL 1000 MG PO TABS: 1000.0000 mg | ORAL_TABLET | Freq: Two times a day (BID) | ORAL | Status: DC

## 2015-06-11 NOTE — Telephone Encounter (Signed)
Refill for MTF  BID denied.   Correct prescription for MTF  BID sent to pharmacy.

## 2015-06-18 ENCOUNTER — Ambulatory Visit: Payer: Medicaid Other | Admitting: Orthopedic Surgery

## 2015-06-24 ENCOUNTER — Telehealth: Payer: Self-pay | Admitting: Family Medicine

## 2015-06-24 DIAGNOSIS — Z23 Encounter for immunization: Secondary | ICD-10-CM

## 2015-06-24 DIAGNOSIS — E119 Type 2 diabetes mellitus without complications: Secondary | ICD-10-CM

## 2015-06-24 DIAGNOSIS — M1710 Unilateral primary osteoarthritis, unspecified knee: Secondary | ICD-10-CM

## 2015-06-24 DIAGNOSIS — I10 Essential (primary) hypertension: Secondary | ICD-10-CM

## 2015-06-24 DIAGNOSIS — IMO0002 Reserved for concepts with insufficient information to code with codable children: Secondary | ICD-10-CM

## 2015-06-24 NOTE — Telephone Encounter (Signed)
Patient calling for rx for her hydrocodone  [phone]

## 2015-06-24 NOTE — Telephone Encounter (Signed)
Ok to refill??  Last office visit 04/24/2015.  Last refill 05/25/2015.

## 2015-06-25 ENCOUNTER — Other Ambulatory Visit: Payer: Self-pay | Admitting: Family Medicine

## 2015-06-25 MED ORDER — HYDROCODONE-ACETAMINOPHEN 5-325 MG PO TABS: 1.0000 | ORAL_TABLET | Freq: Two times a day (BID) | ORAL | Status: DC | PRN

## 2015-06-25 NOTE — Telephone Encounter (Signed)
Prescription printed and patient made aware to come to office to pick up after 2pm.  

## 2015-06-25 NOTE — Telephone Encounter (Signed)
ok 

## 2015-06-30 ENCOUNTER — Other Ambulatory Visit: Payer: Self-pay | Admitting: Family Medicine

## 2015-06-30 NOTE — Telephone Encounter (Signed)
Refill appropriate and filled per protocol. 

## 2015-07-20 ENCOUNTER — Other Ambulatory Visit (INDEPENDENT_AMBULATORY_CARE_PROVIDER_SITE_OTHER): Payer: Self-pay | Admitting: Internal Medicine

## 2015-07-22 ENCOUNTER — Other Ambulatory Visit: Payer: Self-pay | Admitting: Family Medicine

## 2015-07-22 DIAGNOSIS — Z1231 Encounter for screening mammogram for malignant neoplasm of breast: Secondary | ICD-10-CM

## 2015-07-23 ENCOUNTER — Telehealth: Payer: Self-pay | Admitting: Family Medicine

## 2015-07-23 DIAGNOSIS — E119 Type 2 diabetes mellitus without complications: Secondary | ICD-10-CM

## 2015-07-23 DIAGNOSIS — IMO0002 Reserved for concepts with insufficient information to code with codable children: Secondary | ICD-10-CM

## 2015-07-23 DIAGNOSIS — Z23 Encounter for immunization: Secondary | ICD-10-CM

## 2015-07-23 DIAGNOSIS — I10 Essential (primary) hypertension: Secondary | ICD-10-CM

## 2015-07-23 DIAGNOSIS — M1710 Unilateral primary osteoarthritis, unspecified knee: Secondary | ICD-10-CM

## 2015-07-23 NOTE — Telephone Encounter (Signed)
Ok to refill??  Last office visit 04/24/2015.  Last refill 06/25/2015.

## 2015-07-23 NOTE — Telephone Encounter (Signed)
Patient would like a prescription for her HYDROcodone-acetaminophen (NORCO/VICODIN) 5-325 MG

## 2015-07-24 ENCOUNTER — Other Ambulatory Visit: Payer: Self-pay | Admitting: Family Medicine

## 2015-07-24 DIAGNOSIS — E119 Type 2 diabetes mellitus without complications: Secondary | ICD-10-CM

## 2015-07-24 DIAGNOSIS — IMO0002 Reserved for concepts with insufficient information to code with codable children: Secondary | ICD-10-CM

## 2015-07-24 DIAGNOSIS — M1710 Unilateral primary osteoarthritis, unspecified knee: Secondary | ICD-10-CM

## 2015-07-24 DIAGNOSIS — I10 Essential (primary) hypertension: Secondary | ICD-10-CM

## 2015-07-24 DIAGNOSIS — Z23 Encounter for immunization: Secondary | ICD-10-CM

## 2015-07-24 MED ORDER — HYDROCODONE-ACETAMINOPHEN 5-325 MG PO TABS: 1.0000 | ORAL_TABLET | Freq: Two times a day (BID) | ORAL | Status: DC | PRN

## 2015-07-24 NOTE — Telephone Encounter (Signed)
Okay to refill? 

## 2015-07-24 NOTE — Telephone Encounter (Signed)
RX printed, left up front and patient aware to pick up after 2 pm  

## 2015-07-30 ENCOUNTER — Ambulatory Visit (HOSPITAL_COMMUNITY)
Admission: RE | Admit: 2015-07-30 | Discharge: 2015-07-30 | Disposition: A | Discharge status: Home / Self Care | Payer: Medicare Other | Source: Ambulatory Visit | Attending: Family Medicine | Admitting: Family Medicine

## 2015-07-30 DIAGNOSIS — Z1231 Encounter for screening mammogram for malignant neoplasm of breast: Secondary | ICD-10-CM | POA: Diagnosis not present

## 2015-08-12 ENCOUNTER — Encounter (INDEPENDENT_AMBULATORY_CARE_PROVIDER_SITE_OTHER): Payer: Self-pay | Admitting: *Deleted

## 2015-08-25 ENCOUNTER — Telehealth: Payer: Self-pay | Admitting: *Deleted

## 2015-08-25 DIAGNOSIS — IMO0002 Reserved for concepts with insufficient information to code with codable children: Secondary | ICD-10-CM

## 2015-08-25 DIAGNOSIS — E119 Type 2 diabetes mellitus without complications: Secondary | ICD-10-CM

## 2015-08-25 DIAGNOSIS — I10 Essential (primary) hypertension: Secondary | ICD-10-CM

## 2015-08-25 DIAGNOSIS — M1710 Unilateral primary osteoarthritis, unspecified knee: Secondary | ICD-10-CM

## 2015-08-25 DIAGNOSIS — Z23 Encounter for immunization: Secondary | ICD-10-CM

## 2015-08-25 MED ORDER — HYDROCODONE-ACETAMINOPHEN 5-325 MG PO TABS: 1.0000 | ORAL_TABLET | Freq: Two times a day (BID) | ORAL | Status: DC | PRN

## 2015-08-25 NOTE — Telephone Encounter (Signed)
Prescription printed and patient made aware to come to office to pick up on 08/26/2015.

## 2015-08-25 NOTE — Telephone Encounter (Signed)
Noted prescription for Norco is due.   Ok to refill??  Last office visit 04/24/2015.  Last refill 07/24/2015.

## 2015-08-25 NOTE — Telephone Encounter (Signed)
Okay to refill? 

## 2015-08-26 ENCOUNTER — Ambulatory Visit: Payer: Medicare Other | Admitting: Family Medicine

## 2015-09-14 ENCOUNTER — Ambulatory Visit: Payer: Medicare Other | Admitting: Family Medicine

## 2015-09-22 ENCOUNTER — Encounter: Payer: Self-pay | Admitting: Family Medicine

## 2015-09-22 ENCOUNTER — Telehealth: Payer: Self-pay | Admitting: Family Medicine

## 2015-09-22 MED ORDER — BLOOD GLUCOSE TEST VI STRP: ORAL_STRIP | Status: DC

## 2015-09-22 NOTE — Telephone Encounter (Signed)
Prescription for test strips filled per protocol.   Norco refill denied at this time due to multiple No Shows and late cancellations.   Medication can be refilled at patient's next appointment. Patient declined to make appointment at this time.

## 2015-09-22 NOTE — Telephone Encounter (Signed)
Patient called in requesting a refill for her Glucose Blood (BLOOD GLUCOSE TEST STRIPS) STRP  and HYDROcodone-acetaminophen (NORCO/VICODIN) 5-325 MG per tablet [[id number]]

## 2015-09-28 ENCOUNTER — Ambulatory Visit (INDEPENDENT_AMBULATORY_CARE_PROVIDER_SITE_OTHER): Payer: Medicare Other | Admitting: Family Medicine

## 2015-09-28 ENCOUNTER — Encounter: Payer: Self-pay | Admitting: Family Medicine

## 2015-09-28 ENCOUNTER — Other Ambulatory Visit: Payer: Medicare Other

## 2015-09-28 VITALS — BP 126/76 | HR 82 | Temp 98.7°F | Resp 16 | Ht 69.0 in | Wt 196.0 lb

## 2015-09-28 DIAGNOSIS — I10 Essential (primary) hypertension: Secondary | ICD-10-CM | POA: Diagnosis not present

## 2015-09-28 DIAGNOSIS — M179 Osteoarthritis of knee, unspecified: Secondary | ICD-10-CM | POA: Diagnosis not present

## 2015-09-28 DIAGNOSIS — Z23 Encounter for immunization: Secondary | ICD-10-CM

## 2015-09-28 DIAGNOSIS — E785 Hyperlipidemia, unspecified: Secondary | ICD-10-CM

## 2015-09-28 DIAGNOSIS — E119 Type 2 diabetes mellitus without complications: Secondary | ICD-10-CM

## 2015-09-28 DIAGNOSIS — M1710 Unilateral primary osteoarthritis, unspecified knee: Secondary | ICD-10-CM

## 2015-09-28 DIAGNOSIS — E1165 Type 2 diabetes mellitus with hyperglycemia: Secondary | ICD-10-CM | POA: Diagnosis not present

## 2015-09-28 DIAGNOSIS — IMO0001 Reserved for inherently not codable concepts without codable children: Secondary | ICD-10-CM

## 2015-09-28 DIAGNOSIS — IMO0002 Reserved for concepts with insufficient information to code with codable children: Secondary | ICD-10-CM

## 2015-09-28 LAB — BASIC METABOLIC PANEL
BUN: 21 mg/dL (ref 7–25)
CO2: 27 mmol/L (ref 20–31)
Calcium: 9.3 mg/dL (ref 8.6–10.4)
Chloride: 103 mmol/L (ref 98–110)
Creat: 0.62 mg/dL (ref 0.50–0.99)
Glucose, Bld: 223 mg/dL — ABNORMAL HIGH (ref 70–99)
Potassium: 4.3 mmol/L (ref 3.5–5.3)
Sodium: 140 mmol/L (ref 135–146)

## 2015-09-28 LAB — HEMOGLOBIN A1C
Hgb A1c MFr Bld: 8.3 % — ABNORMAL HIGH (ref <5.7)
Mean Plasma Glucose: 192 mg/dL — ABNORMAL HIGH (ref <117)

## 2015-09-28 LAB — LIPID PANEL
Cholesterol: 142 mg/dL (ref 125–200)
HDL: 59 mg/dL (ref >=46)
LDL Cholesterol: 72 mg/dL (ref <130)
Total CHOL/HDL Ratio: 2.4 Ratio (ref <=5.0)
Triglycerides: 57 mg/dL (ref <150)
VLDL: 11 mg/dL (ref <30)

## 2015-09-28 MED ORDER — HYDROCODONE-ACETAMINOPHEN 5-325 MG PO TABS
1.0000 | ORAL_TABLET | Freq: Two times a day (BID) | ORAL | Status: DC | PRN
Start: 2015-09-28 — End: 2015-10-30

## 2015-09-28 MED ORDER — GLUCOSE BLOOD VI STRP: ORAL_STRIP | Status: DC

## 2015-09-28 NOTE — Patient Instructions (Signed)
Referral to orthopedics  Pain medication given Look into [employer]- water aerobics Flu shot given  F/U 4 months

## 2015-09-28 NOTE — Assessment & Plan Note (Signed)
Refer back to ortho 

## 2015-09-28 NOTE — Assessment & Plan Note (Signed)
Recheck A1C diabetes has been uncontrolled, taking oral meds per report, but weight gain noted Goal A1C <7% Time spent discussing importance of F/U and showing up for appt  Flu shot given

## 2015-09-28 NOTE — Progress Notes (Signed)
Patient ID: Miranda DouglasLena B Sparlin, female   DOB: 06/26/1951, 64 y.o.   MRN: [id number]   Subjective:    Patient ID: Miranda DouglasLena B Lemanski, female    DOB: 10/15/1951, 64 y.o.   MRN: [id number]  Patient presents for Medication Review/ Refill  Pt here to f/u chronic medical problems DM- states CBG have been up and down did not bring her meter, is taking meds as prescribed, has gained 6lbs has been eating food at "birthday parties"  OA knee- had transportation issues, needs new referral to ortho for her knees, has exhausted knee injections from my office    Taking all other meds as prescribed   Due for flu shot    Fasting labs done this AM results pending     Review Of Systems:  GEN- denies fatigue, fever, weight loss,weakness, recent illness HEENT- denies eye drainage, change in vision, nasal discharge, CVS- denies chest pain, palpitations RESP- denies SOB, cough, wheeze ABD- denies N/V, change in stools, abd pain GU- denies dysuria, hematuria, dribbling, incontinence MSK- + joint pain, muscle aches, injury Neuro- denies headache, dizziness, syncope, seizure activity       Objective:    BP 126/76 mmHg  Pulse 82  Temp(Src) 98.7 F (37.1 C) (Oral)  Resp 16  Ht 5\' 9"  (1.753 m)  Wt 196 lb (88.905 kg)  BMI 28.93 kg/m2 GEN- NAD, alert and oriented x3 HEENT- PERRL, EOMI, non injected sclera, pink conjunctiva, MMM, oropharynx cleary CVS- RRR, no murmur RESP-CTAB EXT- No edema Pulses- Radial, DP- 2+        Assessment & Plan:      Problem List Items Addressed This Visit    Osteoarthrosis, unspecified whether generalized or localized, involving lower leg   Relevant Medications   HYDROcodone-acetaminophen (NORCO/VICODIN) 5-325 MG tablet   Other Relevant Orders   Ambulatory referral to Orthopedic Surgery   Essential hypertension, benign    Controlled, no change to meds      Relevant Medications   HYDROcodone-acetaminophen (NORCO/VICODIN) 5-325 MG tablet   Diabetes mellitus (HCC) -  Primary    Recheck A1C diabetes has been uncontrolled, taking oral meds per report, but weight gain noted Goal A1C <7% Time spent discussing importance of F/U and showing up for appt  Flu shot given      Relevant Medications   HYDROcodone-acetaminophen (NORCO/VICODIN) 5-325 MG tablet    Other Visit Diagnoses    Need for prophylactic vaccination and inoculation against influenza        Relevant Medications    HYDROcodone-acetaminophen (NORCO/VICODIN) 5-325 MG tablet    Other Relevant Orders    Flu Vaccine QUAD 36+ mos PF IM (Fluarix & Fluzone Quad PF) (Completed)       Note: This dictation was prepared with Dragon dictation along with smaller Lobbyistphrase technology. Any transcriptional errors that result from this process are unintentional.

## 2015-09-28 NOTE — Assessment & Plan Note (Signed)
Controlled, no change to meds 

## 2015-09-30 ENCOUNTER — Encounter: Payer: Medicare Other | Admitting: Family Medicine

## 2015-10-01 ENCOUNTER — Other Ambulatory Visit: Payer: Self-pay | Admitting: Family Medicine

## 2015-10-01 NOTE — Telephone Encounter (Signed)
Refill appropriate and filled per protocol. 

## 2015-10-02 ENCOUNTER — Encounter: Payer: Self-pay | Admitting: Orthopedic Surgery

## 2015-10-05 NOTE — Telephone Encounter (Signed)
-----   Message from Salley ScarletKawanta F [place], MD sent at 09/21/2015  4:52 PM EDT ----- Regarding: FW: re-schedule appointment? Please let pt know she has had multiple No Shows, I will reschedule her 1 more time if she no shows in the future she will be dismissed ----- Message -----    From: Leonia ReevesKatie M Robertson    Sent: 09/21/2015   4:21 PM      To: Salley ScarletKawanta F [place], MD Subject: re-schedule appointment?                       Dr. Jeanice Limurham,  This patient called to reschedule an appointment that she missed with you last week. I noticed that she has had about 7 no-shows with you. Trula OreChristina wanted me to ask your opinion on rescheduling her. What do you think?  Thanks,  [employer] Power[employer]atie

## 2015-10-05 NOTE — Telephone Encounter (Signed)
I called and spoke with patient about the information belowe per Dr. Jeanice Limurham.  I also sent her a letter stating if she had another no show or late cancellation she would be dismissed from the practice.

## 2015-10-06 ENCOUNTER — Other Ambulatory Visit: Payer: Self-pay | Admitting: *Deleted

## 2015-10-06 ENCOUNTER — Encounter: Payer: Self-pay | Admitting: *Deleted

## 2015-10-06 MED ORDER — SIMVASTATIN 10 MG PO TABS: 10.0000 mg | ORAL_TABLET | Freq: Every day | ORAL | Status: DC

## 2015-10-12 ENCOUNTER — Encounter: Payer: Medicare Other | Admitting: Family Medicine

## 2015-10-21 ENCOUNTER — Encounter: Payer: Self-pay | Admitting: *Deleted

## 2015-10-26 ENCOUNTER — Other Ambulatory Visit: Payer: Self-pay | Admitting: Family Medicine

## 2015-10-27 NOTE — Telephone Encounter (Signed)
Refill appropriate and filled per protocol. 

## 2015-10-28 ENCOUNTER — Telehealth: Payer: Self-pay | Admitting: Family Medicine

## 2015-10-28 DIAGNOSIS — M1710 Unilateral primary osteoarthritis, unspecified knee: Secondary | ICD-10-CM

## 2015-10-28 DIAGNOSIS — I10 Essential (primary) hypertension: Secondary | ICD-10-CM

## 2015-10-28 DIAGNOSIS — Z23 Encounter for immunization: Secondary | ICD-10-CM

## 2015-10-28 DIAGNOSIS — IMO0002 Reserved for concepts with insufficient information to code with codable children: Secondary | ICD-10-CM

## 2015-10-28 DIAGNOSIS — E119 Type 2 diabetes mellitus without complications: Secondary | ICD-10-CM

## 2015-10-28 NOTE — Telephone Encounter (Signed)
Patient needs rx for her hydrocodone  [phone]

## 2015-10-29 NOTE — Telephone Encounter (Signed)
Ok to refill??  Last office visit/ refill 09/28/2015.

## 2015-10-30 MED ORDER — HYDROCODONE-ACETAMINOPHEN 5-325 MG PO TABS: 1.0000 | ORAL_TABLET | Freq: Two times a day (BID) | ORAL | Status: DC | PRN

## 2015-10-30 NOTE — Telephone Encounter (Signed)
Prescription printed and patient made aware to come to office to pick up after 2pm on 10/30/2015. 

## 2015-10-30 NOTE — Telephone Encounter (Signed)
okay

## 2015-11-25 ENCOUNTER — Telehealth: Payer: Self-pay | Admitting: Family Medicine

## 2015-11-25 DIAGNOSIS — I10 Essential (primary) hypertension: Secondary | ICD-10-CM

## 2015-11-25 DIAGNOSIS — IMO0002 Reserved for concepts with insufficient information to code with codable children: Secondary | ICD-10-CM

## 2015-11-25 DIAGNOSIS — M1710 Unilateral primary osteoarthritis, unspecified knee: Secondary | ICD-10-CM

## 2015-11-25 DIAGNOSIS — Z23 Encounter for immunization: Secondary | ICD-10-CM

## 2015-11-25 DIAGNOSIS — E119 Type 2 diabetes mellitus without complications: Secondary | ICD-10-CM

## 2015-11-25 MED ORDER — HYDROCODONE-ACETAMINOPHEN 5-325 MG PO TABS: 1.0000 | ORAL_TABLET | Freq: Two times a day (BID) | ORAL | Status: DC | PRN

## 2015-11-25 NOTE — Telephone Encounter (Signed)
Ok to refill??  Last office visit 09/28/2015.  Last refill 10/30/2015.

## 2015-11-25 NOTE — Telephone Encounter (Signed)
Patient needs rx for her hydrocodone  [phone]

## 2015-11-25 NOTE — Telephone Encounter (Signed)
Prescription printed and patient made aware to come to office to pick up after 4pm on 11/25/2015.

## 2015-11-25 NOTE — Telephone Encounter (Signed)
okay

## 2015-12-24 ENCOUNTER — Telehealth: Payer: Self-pay | Admitting: *Deleted

## 2015-12-24 DIAGNOSIS — M1710 Unilateral primary osteoarthritis, unspecified knee: Secondary | ICD-10-CM

## 2015-12-24 DIAGNOSIS — I10 Essential (primary) hypertension: Secondary | ICD-10-CM

## 2015-12-24 DIAGNOSIS — E119 Type 2 diabetes mellitus without complications: Secondary | ICD-10-CM

## 2015-12-24 DIAGNOSIS — Z23 Encounter for immunization: Secondary | ICD-10-CM

## 2015-12-24 DIAGNOSIS — IMO0002 Reserved for concepts with insufficient information to code with codable children: Secondary | ICD-10-CM

## 2015-12-24 NOTE — Telephone Encounter (Signed)
Noted prescription for Norco is due.   Ok to refill??  Last office visit 09/28/2015.  Last refill 11/25/2015.

## 2015-12-25 ENCOUNTER — Ambulatory Visit: Payer: Medicare Other | Admitting: Family Medicine

## 2015-12-25 MED ORDER — HYDROCODONE-ACETAMINOPHEN 5-325 MG PO TABS: 1.0000 | ORAL_TABLET | Freq: Two times a day (BID) | ORAL | Status: DC | PRN

## 2015-12-25 NOTE — Telephone Encounter (Signed)
okay

## 2015-12-25 NOTE — Telephone Encounter (Signed)
Script printed ready for provider signature 

## 2015-12-30 ENCOUNTER — Ambulatory Visit: Payer: Medicare Other | Admitting: Family Medicine

## 2016-01-06 ENCOUNTER — Ambulatory Visit (INDEPENDENT_AMBULATORY_CARE_PROVIDER_SITE_OTHER): Payer: Medicare Other | Admitting: Family Medicine

## 2016-01-06 VITALS — BP 148/82 | HR 88 | Temp 97.4°F | Resp 16 | Ht 69.0 in | Wt 190.0 lb

## 2016-01-06 DIAGNOSIS — E119 Type 2 diabetes mellitus without complications: Secondary | ICD-10-CM | POA: Diagnosis not present

## 2016-01-06 DIAGNOSIS — I10 Essential (primary) hypertension: Secondary | ICD-10-CM

## 2016-01-06 DIAGNOSIS — E785 Hyperlipidemia, unspecified: Secondary | ICD-10-CM | POA: Diagnosis not present

## 2016-01-06 MED ORDER — GLUCOSE BLOOD VI STRP: ORAL_STRIP | Status: DC

## 2016-01-06 MED ORDER — GLIPIZIDE 10 MG PO TABS: 10.0000 mg | ORAL_TABLET | Freq: Two times a day (BID) | ORAL | Status: DC

## 2016-01-06 MED ORDER — METFORMIN HCL 1000 MG PO TABS: 1000.0000 mg | ORAL_TABLET | Freq: Two times a day (BID) | ORAL | Status: DC

## 2016-01-06 MED ORDER — LOSARTAN POTASSIUM-HCTZ 50-12.5 MG PO TABS
1.0000 | ORAL_TABLET | Freq: Every day | ORAL | Status: DC
Start: 2016-01-06 — End: 2016-09-07

## 2016-01-06 MED ORDER — SIMVASTATIN 10 MG PO TABS: 10.0000 mg | ORAL_TABLET | Freq: Every day | ORAL | Status: DC

## 2016-01-06 NOTE — Patient Instructions (Signed)
Restart your medications We will call with lab results Call the orthopedics surgeon for your knee 'F/U 4 months

## 2016-01-06 NOTE — Progress Notes (Signed)
Patient ID: Miranda Sampson, female   DOB: [birth year], 66 y.o.   MRN: [id number]   Subjective:    Patient ID: Miranda Sampson, female    DOB: [birth year], 65 y.o.   MRN: [id number]  Patient presents for F/U  patient here to follow-up diabetes hypertension. Her last A1c was 8.3% she has taken metformin but has been out of her glipizide. She is also missed her blood pressure medication today. She needs refills on all of her meds. She states her sugars have been up and down the lowest 97 but most have been in the mid 200s. She denies any polyuria polydipsia. No hypoglycemia symptoms.  Intentional weight loss of 6lbs- she likes to stay between 185-190  She was referred to orthopedics for her osteoporosis of the knee however she never got in contact with them. She does request a refill on her pain medication today.    Review Of Systems:  GEN- denies fatigue, fever, weight loss,weakness, recent illness HEENT- denies eye drainage, change in vision, nasal discharge, CVS- denies chest pain, palpitations RESP- denies SOB, cough, wheeze ABD- denies N/V, change in stools, abd pain GU- denies dysuria, hematuria, dribbling, incontinence MSK- + joint pain, muscle aches, injury Neuro- denies headache, dizziness, syncope, seizure activity       Objective:    BP 148/82 mmHg  Pulse 88  Temp(Src) 97.4 F (36.3 C) (Oral)  Resp 16  Ht  (1.753 m)  Wt 190 lb (86.183 kg)  BMI 28.05 kg/m2 GEN- NAD, alert and oriented x3 HEENT- PERRL, EOMI, non injected sclera, pink conjunctiva, MMM, oropharynx clear CVS- RRR, no murmur RESP-CTAB EXT- No edema Pulses- Radial, DP- 2+        Assessment & Plan:      Problem List Items Addressed This Visit    Hyperlipidemia   Relevant Medications   losartan-hydrochlorothiazide (HYZAAR) 50-12.5 MG tablet   simvastatin (ZOCOR) 10 MG tablet   Essential hypertension, benign    BP elevated out of BP meds as well, restart      Relevant Medications   losartan-hydrochlorothiazide (HYZAAR) 50-12.5 MG tablet   simvastatin (ZOCOR) 10 MG tablet   Diabetes mellitus (HCC) - Primary    Uncontrolled due to non compliance with medication Restart glipizide in addition to metformin  Also to start the statin at bedtime  Check CBG daily       Relevant Medications   losartan-hydrochlorothiazide (HYZAAR) 50-12.5 MG tablet   metFORMIN (GLUCOPHAGE) 1000 MG tablet   simvastatin (ZOCOR) 10 MG tablet   glipiZIDE (GLUCOTROL) 10 MG tablet   Other Relevant Orders   Basic metabolic panel (Completed)   Hemoglobin A1c (Completed)      Note: This dictation was prepared with Dragon dictation along with smaller phrase technology. Any transcriptional errors that result from this process are unintentional.

## 2016-01-07 ENCOUNTER — Encounter: Payer: Self-pay | Admitting: Family Medicine

## 2016-01-07 LAB — BASIC METABOLIC PANEL
BUN: 17 mg/dL (ref 7–25)
CO2: 24 mmol/L (ref 20–31)
Calcium: 8.9 mg/dL (ref 8.6–10.4)
Chloride: 100 mmol/L (ref 98–110)
Creat: 0.56 mg/dL (ref 0.50–0.99)
Glucose, Bld: 195 mg/dL — ABNORMAL HIGH (ref 70–99)
Potassium: 4 mmol/L (ref 3.5–5.3)
Sodium: 138 mmol/L (ref 135–146)

## 2016-01-07 LAB — HEMOGLOBIN A1C
Hgb A1c MFr Bld: 9.2 % — ABNORMAL HIGH (ref <5.7)
Mean Plasma Glucose: 217 mg/dL — ABNORMAL HIGH (ref <117)

## 2016-01-07 NOTE — Assessment & Plan Note (Signed)
BP elevated out of BP meds as well, restart

## 2016-01-07 NOTE — Assessment & Plan Note (Signed)
Uncontrolled due to non compliance with medication Restart glipizide in addition to metformin  Also to start the statin at bedtime  Check CBG daily

## 2016-02-03 ENCOUNTER — Telehealth: Payer: Self-pay | Admitting: *Deleted

## 2016-02-03 DIAGNOSIS — E119 Type 2 diabetes mellitus without complications: Secondary | ICD-10-CM

## 2016-02-03 DIAGNOSIS — Z23 Encounter for immunization: Secondary | ICD-10-CM

## 2016-02-03 DIAGNOSIS — IMO0002 Reserved for concepts with insufficient information to code with codable children: Secondary | ICD-10-CM

## 2016-02-03 DIAGNOSIS — I10 Essential (primary) hypertension: Secondary | ICD-10-CM

## 2016-02-03 DIAGNOSIS — M1710 Unilateral primary osteoarthritis, unspecified knee: Secondary | ICD-10-CM

## 2016-02-03 MED ORDER — HYDROCODONE-ACETAMINOPHEN 5-325 MG PO TABS: 1.0000 | ORAL_TABLET | Freq: Two times a day (BID) | ORAL | Status: DC | PRN

## 2016-02-03 NOTE — Telephone Encounter (Signed)
Received call from patient.   Requested refill on Hydrocodone. Rip [place] to refill??  Last office visit 01/06/2016.  Last refill 12/25/2015.

## 2016-02-03 NOTE — Telephone Encounter (Signed)
Okay 

## 2016-02-04 NOTE — Telephone Encounter (Signed)
Prescription printed and patient made aware to come to office to pick up on 02/04/2016.

## 2016-03-03 ENCOUNTER — Telehealth: Payer: Self-pay | Admitting: Family Medicine

## 2016-03-03 DIAGNOSIS — E119 Type 2 diabetes mellitus without complications: Secondary | ICD-10-CM

## 2016-03-03 DIAGNOSIS — I10 Essential (primary) hypertension: Secondary | ICD-10-CM

## 2016-03-03 DIAGNOSIS — M1710 Unilateral primary osteoarthritis, unspecified knee: Secondary | ICD-10-CM

## 2016-03-03 DIAGNOSIS — Z23 Encounter for immunization: Secondary | ICD-10-CM

## 2016-03-03 DIAGNOSIS — IMO0002 Reserved for concepts with insufficient information to code with codable children: Secondary | ICD-10-CM

## 2016-03-03 NOTE — Telephone Encounter (Signed)
Ok to refill??  Last office visit 01/06/2016.  Last refill 02/03/2016.

## 2016-03-03 NOTE — Telephone Encounter (Signed)
Patient requesting refill on her HYDROcodone-acetaminophen (NORCO/VICODIN) 5-325 MG tablet  Cb# [phone]

## 2016-03-04 MED ORDER — HYDROCODONE-ACETAMINOPHEN 5-325 MG PO TABS: 1.0000 | ORAL_TABLET | Freq: Two times a day (BID) | ORAL | Status: DC | PRN

## 2016-03-04 NOTE — Telephone Encounter (Signed)
Okay to refill? 

## 2016-03-04 NOTE — Telephone Encounter (Signed)
Prescription printed and patient made aware to come to office to pick up after 2 pm on 03/04/2016. 

## 2016-04-04 ENCOUNTER — Telehealth: Payer: Self-pay | Admitting: Family Medicine

## 2016-04-04 DIAGNOSIS — IMO0002 Reserved for concepts with insufficient information to code with codable children: Secondary | ICD-10-CM

## 2016-04-04 DIAGNOSIS — M1710 Unilateral primary osteoarthritis, unspecified knee: Secondary | ICD-10-CM

## 2016-04-04 DIAGNOSIS — Z23 Encounter for immunization: Secondary | ICD-10-CM

## 2016-04-04 DIAGNOSIS — I10 Essential (primary) hypertension: Secondary | ICD-10-CM

## 2016-04-04 DIAGNOSIS — E119 Type 2 diabetes mellitus without complications: Secondary | ICD-10-CM

## 2016-04-04 MED ORDER — HYDROCODONE-ACETAMINOPHEN 5-325 MG PO TABS: 1.0000 | ORAL_TABLET | Freq: Two times a day (BID) | ORAL | Status: DC | PRN

## 2016-04-04 NOTE — Telephone Encounter (Signed)
Okay to refill? 

## 2016-04-04 NOTE — Telephone Encounter (Signed)
Patient would like rx for hydrocodone  [phone]

## 2016-04-04 NOTE — Telephone Encounter (Signed)
Ok to refill??  Last office visit 01/06/2016.  Last refill 03/04/2016.

## 2016-04-04 NOTE — Telephone Encounter (Signed)
Prescription printed and patient made aware to come to office to pick up on 04/05/2016 via VM.

## 2016-05-04 ENCOUNTER — Telehealth: Payer: Self-pay | Admitting: Family Medicine

## 2016-05-04 DIAGNOSIS — E119 Type 2 diabetes mellitus without complications: Secondary | ICD-10-CM

## 2016-05-04 DIAGNOSIS — M1710 Unilateral primary osteoarthritis, unspecified knee: Secondary | ICD-10-CM

## 2016-05-04 DIAGNOSIS — IMO0002 Reserved for concepts with insufficient information to code with codable children: Secondary | ICD-10-CM

## 2016-05-04 DIAGNOSIS — Z23 Encounter for immunization: Secondary | ICD-10-CM

## 2016-05-04 DIAGNOSIS — I10 Essential (primary) hypertension: Secondary | ICD-10-CM

## 2016-05-04 MED ORDER — HYDROCODONE-ACETAMINOPHEN 5-325 MG PO TABS: 1.0000 | ORAL_TABLET | Freq: Two times a day (BID) | ORAL | Status: DC | PRN

## 2016-05-04 NOTE — Telephone Encounter (Signed)
Ok to refill??  Last office visit 01/06/2016.  Last refill 04/04/2016.

## 2016-05-04 NOTE — Telephone Encounter (Signed)
Prescription printed and patient made aware to come to office to pick up after 4 pm on 05/04/2016.

## 2016-05-04 NOTE — Telephone Encounter (Signed)
okay

## 2016-05-04 NOTE — Telephone Encounter (Signed)
Patient calling requesting prescription for her HYDROcodone-acetaminophen (NORCO/VICODIN) 5-325 MG tablet   CB# [phone]

## 2016-05-06 ENCOUNTER — Other Ambulatory Visit: Payer: Self-pay | Admitting: Family Medicine

## 2016-05-10 ENCOUNTER — Ambulatory Visit: Payer: Medicare Other | Admitting: Family Medicine

## 2016-05-13 ENCOUNTER — Encounter: Payer: Self-pay | Admitting: Family Medicine

## 2016-05-23 ENCOUNTER — Emergency Department (HOSPITAL_COMMUNITY)
Admission: EM | Admit: 2016-05-23 | Discharge: 2016-05-23 | Disposition: A | Discharge status: Home / Self Care | Payer: Medicare Other | Attending: Emergency Medicine | Admitting: Emergency Medicine

## 2016-05-23 ENCOUNTER — Ambulatory Visit (INDEPENDENT_AMBULATORY_CARE_PROVIDER_SITE_OTHER): Payer: Medicare Other | Admitting: Family Medicine

## 2016-05-23 ENCOUNTER — Emergency Department (HOSPITAL_COMMUNITY): Payer: Medicare Other

## 2016-05-23 ENCOUNTER — Encounter (HOSPITAL_COMMUNITY): Payer: Self-pay | Admitting: Emergency Medicine

## 2016-05-23 ENCOUNTER — Encounter: Payer: Self-pay | Admitting: Family Medicine

## 2016-05-23 VITALS — BP 130/74 | HR 166 | Temp 98.1°F | Resp 12 | Ht 69.0 in | Wt 193.0 lb

## 2016-05-23 DIAGNOSIS — I10 Essential (primary) hypertension: Secondary | ICD-10-CM

## 2016-05-23 DIAGNOSIS — R000 Tachycardia, unspecified: Secondary | ICD-10-CM

## 2016-05-23 DIAGNOSIS — R42 Dizziness and giddiness: Secondary | ICD-10-CM | POA: Diagnosis not present

## 2016-05-23 DIAGNOSIS — E785 Hyperlipidemia, unspecified: Secondary | ICD-10-CM | POA: Diagnosis not present

## 2016-05-23 DIAGNOSIS — R0602 Shortness of breath: Secondary | ICD-10-CM | POA: Diagnosis not present

## 2016-05-23 DIAGNOSIS — Z79899 Other long term (current) drug therapy: Secondary | ICD-10-CM | POA: Diagnosis not present

## 2016-05-23 DIAGNOSIS — E119 Type 2 diabetes mellitus without complications: Secondary | ICD-10-CM

## 2016-05-23 DIAGNOSIS — I471 Supraventricular tachycardia: Secondary | ICD-10-CM

## 2016-05-23 DIAGNOSIS — F329 Major depressive disorder, single episode, unspecified: Secondary | ICD-10-CM | POA: Diagnosis not present

## 2016-05-23 DIAGNOSIS — Z7984 Long term (current) use of oral hypoglycemic drugs: Secondary | ICD-10-CM | POA: Diagnosis not present

## 2016-05-23 DIAGNOSIS — R404 Transient alteration of awareness: Secondary | ICD-10-CM | POA: Diagnosis not present

## 2016-05-23 LAB — TSH: TSH: 1.201 u[IU]/mL (ref 0.350–4.500)

## 2016-05-23 LAB — CBC WITH DIFFERENTIAL/PLATELET
Basophils Absolute: 0 10*3/uL (ref 0.0–0.1)
Basophils Relative: 0 %
Eosinophils Absolute: 0.1 10*3/uL (ref 0.0–0.7)
Eosinophils Relative: 1 %
HCT: 43.9 % (ref 36.0–46.0)
Hemoglobin: 14.3 g/dL (ref 12.0–15.0)
Lymphocytes Relative: 18 %
Lymphs Abs: 1.4 10*3/uL (ref 0.7–4.0)
MCH: 32.3 pg (ref 26.0–34.0)
MCHC: 32.6 g/dL (ref 30.0–36.0)
MCV: 99.1 fL (ref 78.0–100.0)
Monocytes Absolute: 0.7 10*3/uL (ref 0.1–1.0)
Monocytes Relative: 9 %
Neutro Abs: 5.6 10*3/uL (ref 1.7–7.7)
Neutrophils Relative %: 72 %
Platelets: 170 10*3/uL (ref 150–400)
RBC: 4.43 MIL/uL (ref 3.87–5.11)
RDW: 13.2 % (ref 11.5–15.5)
WBC: 7.8 10*3/uL (ref 4.0–10.5)

## 2016-05-23 LAB — COMPREHENSIVE METABOLIC PANEL
ALT: 16 U/L (ref 14–54)
AST: 18 U/L (ref 15–41)
Albumin: 3.3 g/dL — ABNORMAL LOW (ref 3.5–5.0)
Alkaline Phosphatase: 65 U/L (ref 38–126)
Anion gap: 9 (ref 5–15)
BUN: 19 mg/dL (ref 6–20)
CO2: 21 mmol/L — ABNORMAL LOW (ref 22–32)
Calcium: 9.2 mg/dL (ref 8.9–10.3)
Chloride: 108 mmol/L (ref 101–111)
Creatinine, Ser: 0.69 mg/dL (ref 0.44–1.00)
GFR calc Af Amer: >60 mL/min (ref >60)
GFR calc non Af Amer: >60 mL/min (ref >60)
Glucose, Bld: 221 mg/dL — ABNORMAL HIGH (ref 65–99)
Potassium: 4 mmol/L (ref 3.5–5.1)
Sodium: 138 mmol/L (ref 135–145)
Total Bilirubin: 1.1 mg/dL (ref 0.3–1.2)
Total Protein: 6.5 g/dL (ref 6.5–8.1)

## 2016-05-23 LAB — GLUCOSE, FINGERSTICK (STAT): Glucose, fingerstick: 187 mg/dL — ABNORMAL HIGH (ref 70–99)

## 2016-05-23 LAB — T4, FREE: Free T4: 0.95 ng/dL (ref 0.61–1.12)

## 2016-05-23 LAB — I-STAT TROPONIN, ED: Troponin i, poc: 0 ng/mL (ref 0.00–0.08)

## 2016-05-23 LAB — BRAIN NATRIURETIC PEPTIDE: B Natriuretic Peptide: 347.9 pg/mL — ABNORMAL HIGH (ref 0.0–100.0)

## 2016-05-23 LAB — MAGNESIUM: Magnesium: 1.4 mg/dL — ABNORMAL LOW (ref 1.7–2.4)

## 2016-05-23 MED ORDER — MAGNESIUM SULFATE 2 GM/50ML IV SOLN
2.0000 g | Freq: Once | INTRAVENOUS | Status: AC
  Administered 2016-05-23: 2 g via INTRAVENOUS
  Filled 2016-05-23: qty 50

## 2016-05-23 MED ORDER — METOPROLOL TARTRATE 25 MG PO TABS: 25.0000 mg | ORAL_TABLET | Freq: Every day | ORAL | Status: DC | PRN

## 2016-05-23 MED ORDER — GLUCOSE BLOOD VI STRP: ORAL_STRIP | Status: DC

## 2016-05-23 NOTE — ED Provider Notes (Signed)
SVT with HR 160s, newly dx. Hx of cocaine use. EMS placed PIV and now in NSR. TSH, troponin, BNP pending. Mag replaced. CXR negative. If all labs negative, may dc and fu with cardiology.  Labs unremarkable. No further episodes reported. Discharge home with cardiology and primary care provider follow-up. Course of Lopressor ordered for the patient.   Sidney AceAlison Charruf Adonias Demore, MD 05/24/16 [id number]  Zadie Rhineonald Wickline, MD 05/24/16 [phone]

## 2016-05-23 NOTE — ED Provider Notes (Signed)
CSN: [id number]     Arrival date & time    History   First MD Initiated Contact with Patient 05/23/16 1358     Chief Complaint  Patient presents with  . Tachycardia    HPI Comments: 65 y.o. female with a past medical history of remote cocaine abuse, hypertension, hyperlipidemia, diabetes presents to the ED from her primary care's office due to SVT. She reports feeling palpitations since last night. She went to her primary care physician's office for routine follow-up today when they noted her heart rate to be in the 160s. EMS was called. Vagal maneuvers were tried. Upon insertion of an IV she spontaneously converted to normal sinus rhythm. Associated symptoms include nausea and shortness of breath.  Patient is a 65 y.o. female presenting with palpitations. The history is provided by the patient and the EMS personnel.  Palpitations Palpitations quality:  Fast Onset quality:  Unable to specify Duration: Since last night. Timing:  Constant Progression:  Resolved Chronicity:  New Context: not caffeine, not illicit drugs (denies any drug use) and not nicotine   Relieved by: EMS placing IV. Worsened by:  Nothing Ineffective treatments:  None tried Associated symptoms: nausea and shortness of breath   Associated symptoms: no chest pain and no chest pressure   Nausea:    Severity:  Moderate   Onset quality:  Gradual   Nausea duration: Since last night.   Timing:  Constant   Progression:  Unchanged Shortness of breath:    Severity:  Mild   Onset quality:  Gradual   Duration: Since last night.   Progression:  Resolved Risk factors: no hx of atrial fibrillation and no hyperthyroidism     Past Medical History  Diagnosis Date  . Allergy   . GERD (gastroesophageal reflux disease)   . Diabetes mellitus   . Arthritis   . Depression   . Substance abuse     Cocaine quit 2009  . Hyperlipidemia   . Hypertension    Past Surgical History  Procedure Laterality Date  .  tumor removed from  stomach  1996  . Tonsillectomy    . Esophagogastroduodenoscopy N/A 03/20/2014    Procedure: ESOPHAGOGASTRODUODENOSCOPY (EGD);  Surgeon: Malissa HippoNajeeb U Rehman, MD;  Location: [facility];  Service: Endoscopy;  Laterality: N/A;  140   Family History  Problem Relation Age of Onset  . Heart disease Mother   . Diabetes Mother   . Diabetes Father   . Cancer Sister     leukemia   . Colon cancer Neg Hx   . Stomach cancer Neg Hx    Social History  Substance Use Topics  . Smoking status: Never Smoker   . Smokeless tobacco: Never Used  . Alcohol Use: Yes     Comment: socially   OB History    No data available     Review of Systems  Constitutional: Negative for fever and chills.  HENT: Negative for congestion.   Eyes: Negative for redness.  Respiratory: Positive for shortness of breath.   Cardiovascular: Positive for palpitations. Negative for chest pain.  Gastrointestinal: Positive for nausea. Negative for abdominal pain.  Musculoskeletal: Negative for neck stiffness.  Skin: Negative for rash.  Neurological: Negative for syncope and light-headedness.  Psychiatric/Behavioral: Negative for confusion.      Allergies  Review of patient's allergies indicates no known allergies.  Home Medications   Prior to Admission medications   Medication Sig Start Date End Date Taking? Authorizing Provider  glipiZIDE (GLUCOTROL) 10 MG  tablet Take 1 tablet (10 mg total) by mouth 2 (two) times daily. 01/06/16   Salley Scarlet, MD  glucose blood (ACCU-CHEK AVIVA) test strip Use as instructed to monitor FSBS 2x daily. Dx: E11.9 01/06/16   Salley Scarlet, MD  HYDROcodone-acetaminophen (NORCO/VICODIN) 5-325 MG tablet Take 1 tablet by mouth 2 (two) times daily as needed for moderate pain. 05/04/16   Salley Scarlet, MD  losartan-hydrochlorothiazide (HYZAAR) 50-12.5 MG tablet Take 1 tablet by mouth daily. 01/06/16   Salley Scarlet, MD  metFORMIN (GLUCOPHAGE) 1000 MG tablet Take 1 tablet (1,000 mg total)  by mouth 2 (two) times daily. 01/06/16   Salley Scarlet, MD  pantoprazole (PROTONIX) 40 MG tablet TAKE 1 TABLET TWICE DAILY (BEFORE BREAKFAST AND EVENING MEAL) 07/20/15   Len Blalock, NP  simvastatin (ZOCOR) 10 MG tablet Take 1 tablet (10 mg total) by mouth at bedtime. 01/06/16   Salley Scarlet, MD   BP 128/70 mmHg  Pulse 76  Temp(Src) 98.6 F (37 C) (Oral)  Resp 17  SpO2 95% Physical Exam  Constitutional: She is oriented to person, place, and time. She appears well-developed and well-nourished. No distress.  HENT:  Head: Normocephalic and atraumatic.  Right Ear: External ear normal.  Left Ear: External ear normal.  Neck: Normal range of motion.  Cardiovascular: Normal rate, regular rhythm and intact distal pulses.   Pulses:      Radial pulses are 2+ on the right side, and 2+ on the left side.       Dorsalis pedis pulses are 2+ on the right side, and 2+ on the left side.  Trace pitting edema around ankles bilaterally  Pulmonary/Chest: Effort normal and breath sounds normal.  Abdominal: Soft. She exhibits no distension. There is no tenderness.  Neurological: She is alert and oriented to person, place, and time.  Skin: Skin is warm and dry. She is not diaphoretic.  Psychiatric: She has a normal mood and affect.  Vitals reviewed.   ED Course  Procedures   Labs Review Labs Reviewed  COMPREHENSIVE METABOLIC PANEL - Abnormal; Notable for the following:    CO2 21 (*)    Glucose, Bld 221 (*)    Albumin 3.3 (*)    All other components within normal limits  MAGNESIUM - Abnormal; Notable for the following:    Magnesium 1.4 (*)    All other components within normal limits  CBC WITH DIFFERENTIAL/PLATELET  TSH  T4, FREE  BRAIN NATRIURETIC PEPTIDE  I-STAT TROPOININ, ED    Imaging Review Dg Chest Port 1 View  05/23/2016  CLINICAL DATA:  Shortness of breath with palpitations. Symptoms began last night. EXAM: PORTABLE CHEST 1 VIEW COMPARISON:  06/08/2012 FINDINGS: 1620 hours.  Lung volumes are low. Bibasilar atelectasis noted. Cardiopericardial silhouette is at upper limits of normal for size. The visualized bony structures of the thorax are intact. Telemetry leads overlie the chest. IMPRESSION: Low volume film with basilar atelectasis. Electronically Signed   By: Kennith Center M.D.   On: 05/23/2016 15:32   I have personally reviewed and evaluated these images and lab results as part of my medical decision-making.   EKG Interpretation   Date/Time:  Monday May 23 2016 13:59:15 EDT Ventricular Rate:  79 PR Interval:  154 QRS Duration: 76 QT Interval:  442 QTC Calculation: 507 R Axis:   19 Text Interpretation:  Sinus rhythm Probable left atrial enlargement RSR'  in V1 or V2, probably normal variant Prolonged QT interval Baseline wander  in lead(s) II III aVR aVF V1 V2 V3 V4 V5 V6 Confirmed by ALLEN  MD,  ANTHONY ([id number]) on 05/23/2016 2:12:13 PM      MDM   Final diagnoses:  SVT (supraventricular tachycardia) (HCC)   65 y.o. female with a past medical history of remote cocaine abuse, hypertension, hyperlipidemia, diabetes presents to the ED from her primary care's office due to SVT. She reports feeling palpitations since last night. She went to her primary care physician's office for routine follow-up today when they noted her heart rate to be in the 160s. EMS was called. Vagal maneuvers were tried. Upon insertion of an IV she spontaneously converted to normal sinus rhythm. Associated symptoms include nausea and shortness of breath.  Remainder of history of present illness, review of systems, and exam as above. She is currently feeling more like her usual self. Her exam is unremarkable.  Labs were ordered including a CBC, CMP, magnesium, TSH, free T4, BNP, and troponin. EKG showing sinus rhythm.  I gave signout to Dr. Franklyn Lor at [phone]. Plan to follow up remaining labs. Plan to reassess the patient and if she has no further episodes would discharge her with plan for  cardiology follow-up, and small amount of a low dose beta blocker to take PRN.  Case managed in conjunction with my attending, Dr. Freida Busman.     Maxine Glenn, MD 05/23/16 1550

## 2016-05-23 NOTE — ED Provider Notes (Signed)
I saw and evaluated the patient, reviewed the resident's note and I agree with the findings and plan.   EKG Interpretation   Date/Time:  Monday May 23 2016 13:59:15 EDT Ventricular Rate:  79 PR Interval:  154 QRS Duration: 76 QT Interval:  442 QTC Calculation: 507 R Axis:   19 Text Interpretation:  Sinus rhythm Probable left atrial enlargement RSR'  in V1 or V2, probably normal variant Prolonged QT interval Baseline wander  in lead(s) II III aVR aVF V1 V2 V3 V4 V5 V6 Confirmed by Alphonzo Devera  MD,  Celia Friedland ([id number]) on 05/23/2016 2:12:13 PM     Patient here after having SVT with heart rate up to 160 prior to arrival. It resolved after the patient had an IV stick. She is asymptomatic at this time. Order blood work and monitored here and likely discharge  Lorre NickAnthony Kyesha Balla, MD 05/23/16 1413

## 2016-05-23 NOTE — Discharge Instructions (Signed)
Paroxysmal Supraventricular Tachycardia Paroxysmal supraventricular tachycardia (PSVT) is a type of abnormal heart rhythm. It causes your heart to beat very quickly and then suddenly stop beating so quickly. A normal heart rate is 60-100 beats per minute. During an episode of PSVT, your heart rate may be 150-250 beats per minute. This can make you feel light-headed and short of breath. An episode of PSVT can be frightening. It is usually not dangerous. The heart has four chambers. All chambers need to work together for the heart to beat effectively. A normal heartbeat usually starts in the right upper chamber of the heart (atrium) when an area (sinoatrial node) puts out an electrical signal that spreads to the other chambers. People with PSVT may have abnormal electrical pathways, or they may have other areas in the upper chambers that send out electrical signals. The result is a very rapid heartbeat. When your heart beats very quickly, it does not have time to fill completely with blood. When PSVT happens often or it lasts for long periods, it can lead to heart weakness and failure. Most people with PSVT do not have any other heart disease. CAUSES Abnormal electrical activity in the heart causes PSVT. It is not known why some people get PSVT and others do not. RISK FACTORS You may be more likely to have PSVT if:  You are 20-30 years old.  You are a woman. Other factors that may increase your chances of an attack include:  Stress.  Being tired.  Smoking.  Stimulant drugs.  Alcoholic drinks.  Caffeine.  Pregnancy. SIGNS AND SYMPTOMS A mild episode of PSVT may cause no symptoms. If you do have signs and symptoms, they may include:  A pounding heart.  Feeling of skipped heartbeats (palpitations).  Weakness.  Shortness of breath.  Tightness or pain in your chest.  Light-headedness.  Anxiety.  Dizziness.  Sweating.  Nausea.  A fainting spell. DIAGNOSIS Your health care  provider may suspect PSVT if you have symptoms that come and go. The health care provider will do a physical exam. If you are having an episode during the exam, the health care provider may be able to diagnose PSVT by listening to your heart and feeling your pulse. Tests may also be done, including:  An electrical study of your heart (electrocardiogram, or ECG).  A test in which you wear a portable ECG monitor all day (Holter monitor) or for several days (event monitor).  A test that involves taking an image of your heart using sound waves (echocardiogram) to rule out other causes of a fast heart rate. TREATMENT You may not need treatment if episodes of PSVT do not happen often or if they do not cause symptoms. If PSVT episodes do cause symptoms, your health care provider may first suggest trying a self-treatment called vagus nerve stimulation. The vagus nerve extends down from the brain. It regulates certain body functions. Stimulating this nerve can slow down the heart. Your health care provider can teach you ways to do this. You may need to try a few ways to find what works best for you. Options include:  Holding your breath and pushing, as though you are having a bowel movement.  Massaging an area on one side of your neck below your jaw.  Bending forward with your head between your legs.  Bending forward with your head between your legs and coughing.  Massaging your eyeballs with your eyes closed. If vagus nerve stimulation does not work, other treatment options include:    Medicines to prevent an attack.  Being treated in the hospital with medicine or electric shock to stop an attack (cardioversion). This treatment can include:  Getting medicine through an IV line.  Having a small electric shock delivered to your heart. You will be given medicine to make you sleep through this procedure.  If you have frequent episodes with symptoms, you may need a procedure to get rid of the faulty  areas of your heart (radiofrequency ablation) and end the episodes of PSVT. In this procedure:  A long, thin tube (catheter) is passed through one of your veins into your heart.  Energy directed through the catheter eliminates the areas of your heart that are causing abnormal electric stimulation. HOME CARE INSTRUCTIONS  Take medicines only as directed by your health care provider.  Do not use caffeine in any form if caffeine triggers episodes of PSVT. Otherwise, consume caffeine in moderation. This means no more than a few cups of coffee or the equivalent each day.  Do not drink alcohol if alcohol triggers episodes of PSVT. Otherwise, limit alcohol intake to no more than 1 drink per day for nonpregnant women and 2 drinks per day for men. One drink equals 12 ounces of beer, 5 ounces of wine, or 1 ounces of hard liquor.  Do not use any tobacco products, including cigarettes, chewing tobacco, or electronic cigarettes. If you need help quitting, ask your health care provider.  Try to get at least 7 hours of sleep each night.  Find healthy ways to manage stress.  Perform vagus nerve stimulation as directed by your health care provider.  Maintain a healthy weight.  Get some exercise on most days. Ask your health care provider to suggest some good activities for you. SEEK MEDICAL CARE IF:  You are having episodes of PSVT more often, or they are lasting longer.  Vagus nerve stimulation is no longer helping.  You have new symptoms during an episode. SEEK IMMEDIATE MEDICAL CARE IF:  You have chest pain or trouble breathing.  You have an episode of PSVT that has lasted longer than 20 minutes.  You have passed out from an episode of PSVT. These symptoms may represent a serious problem that is an emergency. Do not wait to see if the symptoms will go away. Get medical help right away. Call your local emergency services (911 in the [place] Do not drive yourself to the hospital.   This  information is not intended to replace advice given to you by your health care provider. Make sure you discuss any questions you have with your health care provider.   Document Released: 11/28/2005 Document Revised: 12/19/2014 Document Reviewed: 05/08/2014 Elsevier Interactive Patient Education 2016 [employer]  

## 2016-05-23 NOTE — Progress Notes (Signed)
Patient ID: Miranda Sampson, female   DOB: 07/25/1951, 65 y.o.   MRN: [id number]    Subjective:    Patient ID: Miranda DouglasLena B Miranda Sampson, female    DOB: 04/16/1951, 65 y.o.   MRN: [id number]  Patient presents for 4 month F/U and GI Upset   Patient here for follow-up on diabetes mellitus however she does complain of some nausea and GI associates she had diarrhea twice today her legs have also been swelling. She does admit to eating a lot of pork and salty foods this weekend. She has not had any actual emesis she's not had any fever. He denies any chest pain. She does feel a little dizzy. On examination she was found to be significantly tachycardic EKG was done which showed SVT heart rate in the 160s Glucose was 167, oxygen saturation dropped down to 89-90%. Carotid massage was started 1 L of oxygen was placed she did not convert. Her partner was called and we then started Valsalva maneuvers and use ice packs that she did not convert. EMS [place] called. It was made to put in IV on the third attempt IV was placed however she then converted to sinus rhythm. Her blood pressure did one point dropped down to 90/62 and then rebounded back up to 147/68   No recent new medications.   Review Of Systems:  GEN- denies fatigue, fever, weight loss,weakness, recent illness HEENT- denies eye drainage, change in vision, nasal discharge, CVS- denies chest pain, palpitations RESP- denies SOB, cough, wheeze ABD- + N/V, +change in stools, abd pain GU- denies dysuria, hematuria, dribbling, incontinence MSK- denies joint pain, muscle aches, injury Neuro- denies headache,+ dizziness, syncope, seizure activity       Objective:    BP 130/74 mmHg  Pulse 166  Temp(Src) 98.1 F (36.7 C) (Oral)  Resp 12  Ht 5\' 9"  (1.753 m)  Wt 193 lb (87.544 kg)  BMI 28.49 kg/m2  SpO2 90% GEN- NAD, alert and oriented x3 HEENT- PERRL, EOMI, non injected sclera, pink conjunctiva, MMM, oropharynx clear Neck- Supple, no thyromegaly CVS-  tachycardia , no murmur RESP-CTAB ABD-NABS,soft,NT,ND EXT-pedal 1+ edema Pulses- Radial 2+ , DP- 1+   EKG- SVT, HR 160's     Assessment & Plan:      Problem List Items Addressed This Visit    Hyperlipidemia   Essential hypertension, benign    BP typically controlled       Relevant Orders   CBC   EKG 12-Lead (Completed)   Diabetes mellitus (HCC) - Primary    Uncontrolled, with above situation will f/u pending results of hospital work up      Relevant Orders   Glucose, fingerstick (stat)    Other Visit Diagnoses    Tachycardia        Relevant Orders    EKG 12-Lead (Completed)    SVT (supraventricular tachycardia) (HCC)        SVT noted, spontaneous conversion after measures above, [facility]. She remained alert entire time. Family member with her was notified       Note: This dictation was prepared with Dragon dictation along with smaller phrase technology. Any transcriptional errors that result from this process are unintentional.

## 2016-05-23 NOTE — Assessment & Plan Note (Signed)
BP typically controlled

## 2016-05-23 NOTE — Addendum Note (Signed)
Addended by: Phillips OdorSIX, CHRISTINA H on: 05/23/2016 03:11 PM   Modules accepted: Orders

## 2016-05-23 NOTE — ED Provider Notes (Signed)
Pt stable Sleeping at this time No further episodes reported  D/c home with cardiology and PCP followup Course of lopressor ordered for patient   Zadie Rhineonald Angela Vazguez, MD 05/23/16 1807

## 2016-05-23 NOTE — Assessment & Plan Note (Signed)
Uncontrolled, with above situation will f/u pending results of hospital work up

## 2016-05-23 NOTE — ED Notes (Signed)
Patient comes from MD office states started having "racing Heart feeling" with nausea. Per EMS HR 160. Patient converted with the start of the IV. Patient denies any CP or nausea on arrival.

## 2016-05-27 ENCOUNTER — Ambulatory Visit (INDEPENDENT_AMBULATORY_CARE_PROVIDER_SITE_OTHER): Payer: Medicare Other | Admitting: Family Medicine

## 2016-05-27 DIAGNOSIS — R609 Edema, unspecified: Secondary | ICD-10-CM

## 2016-05-27 DIAGNOSIS — I471 Supraventricular tachycardia, unspecified: Secondary | ICD-10-CM | POA: Insufficient documentation

## 2016-05-27 DIAGNOSIS — E119 Type 2 diabetes mellitus without complications: Secondary | ICD-10-CM

## 2016-05-27 NOTE — Assessment & Plan Note (Addendum)
Continue lopressor, obtain 2D Echo, set up with cardiology  Add lasix 20mg  one a day

## 2016-05-27 NOTE — Progress Notes (Signed)
Patient ID: Miranda DouglasLena B Sampson, female   DOB: 03/30/1951, 65 y.o.   MRN: [id number]    Subjective:    Patient ID: Miranda Sampson, female    DOB: 10/24/1951, 65 y.o.   MRN: [id number]  Patient presents for No chief complaint on file. Patient here for ER follow-up. She was here for her regular visit on the 12th at that time she was in SVT she was sent to emergency room however she did confer before she got there. Troponin workup was negative no sign of NSTEMI Chest x-ray showed basilar atelectasis otherwise no fluid overload she did have some pedal edema on exam which was new. Labs at the hospital showed a glucose of 221 renal function was preserved liver function was normal Magnesium was a little low BNP was also elevated at 347 She was discharged home with Lopressor   PT NO SHOWED    Review Of Systems:  GEN- denies fatigue, fever, weight loss,weakness, recent illness HEENT- denies eye drainage, change in vision, nasal discharge, CVS- denies chest pain, palpitations RESP- denies SOB, cough, wheeze ABD- denies N/V, change in stools, abd pain GU- denies dysuria, hematuria, dribbling, incontinence MSK- denies joint pain, muscle aches, injury Neuro- denies headache, dizziness, syncope, seizure activity       Objective:    There were no vitals taken for this visit. GEN- NAD, alert and oriented x3 HEENT- PERRL, EOMI, non injected sclera, pink conjunctiva, MMM, oropharynx clear Neck- Supple, no thyromegaly CVS- RRR, no murmur RESP-CTAB ABD-NABS,soft,NT,ND EXT- No edema Pulses- Radial, DP- 2+        Assessment & Plan:      Problem List Items Addressed This Visit    SVT (supraventricular tachycardia) (HCC) - Primary    Continue lopressor, obtain 2D Echo, set up with cardiology  Add lasix 20mg  one a day       Peripheral edema   Diabetes mellitus (HCC)      Note: This dictation was prepared with Dragon dictation along with smaller phrase technology. Any transcriptional errors that  result from this process are unintentional.

## 2016-05-30 ENCOUNTER — Telehealth: Payer: Self-pay | Admitting: Family Medicine

## 2016-05-30 NOTE — Telephone Encounter (Signed)
-----   Message from Birdie HopesSusan S Bullins sent at 05/30/2016 11:31 AM EDT ----- i called patient, and she let me know that her sister had died Friday, she said she would call back and schedule this, she was very busy making arrangements for her sister

## 2016-06-07 ENCOUNTER — Encounter: Payer: Self-pay | Admitting: Family Medicine

## 2016-06-07 ENCOUNTER — Ambulatory Visit (INDEPENDENT_AMBULATORY_CARE_PROVIDER_SITE_OTHER): Payer: Medicare Other | Admitting: Family Medicine

## 2016-06-07 VITALS — BP 128/78 | HR 88 | Temp 97.3°F | Resp 14 | Ht 69.0 in | Wt 192.0 lb

## 2016-06-07 DIAGNOSIS — I471 Supraventricular tachycardia: Secondary | ICD-10-CM | POA: Diagnosis not present

## 2016-06-07 DIAGNOSIS — IMO0002 Reserved for concepts with insufficient information to code with codable children: Secondary | ICD-10-CM

## 2016-06-07 DIAGNOSIS — Z23 Encounter for immunization: Secondary | ICD-10-CM | POA: Diagnosis not present

## 2016-06-07 DIAGNOSIS — I10 Essential (primary) hypertension: Secondary | ICD-10-CM

## 2016-06-07 DIAGNOSIS — M179 Osteoarthritis of knee, unspecified: Secondary | ICD-10-CM

## 2016-06-07 DIAGNOSIS — E119 Type 2 diabetes mellitus without complications: Secondary | ICD-10-CM | POA: Diagnosis not present

## 2016-06-07 DIAGNOSIS — M1710 Unilateral primary osteoarthritis, unspecified knee: Secondary | ICD-10-CM

## 2016-06-07 LAB — CBC WITH DIFFERENTIAL/PLATELET
Basophils Absolute: 64 cells/uL (ref 0–200)
Basophils Relative: 1 %
Eosinophils Absolute: 128 cells/uL (ref 15–500)
Eosinophils Relative: 2 %
HCT: 46.6 % — ABNORMAL HIGH (ref 35.0–45.0)
Hemoglobin: 15.2 g/dL — ABNORMAL HIGH (ref 12.0–15.0)
Lymphocytes Relative: 22 %
Lymphs Abs: 1408 cells/uL (ref 850–3900)
MCH: 32.4 pg (ref 27.0–33.0)
MCHC: 32.6 g/dL (ref 32.0–36.0)
MCV: 99.4 fL (ref 80.0–100.0)
MPV: 11.9 fL (ref 7.5–12.5)
Monocytes Absolute: 640 cells/uL (ref 200–950)
Monocytes Relative: 10 %
Neutro Abs: 4160 cells/uL (ref 1500–7800)
Neutrophils Relative %: 65 %
Platelets: 299 10*3/uL (ref 140–400)
RBC: 4.69 MIL/uL (ref 3.80–5.10)
RDW: 13 % (ref 11.0–15.0)
WBC: 6.4 10*3/uL (ref 3.8–10.8)

## 2016-06-07 LAB — COMPREHENSIVE METABOLIC PANEL
ALT: 8 U/L (ref 6–29)
AST: 11 U/L (ref 10–35)
Albumin: 3.7 g/dL (ref 3.6–5.1)
Alkaline Phosphatase: 108 U/L (ref 33–130)
BUN: 19 mg/dL (ref 7–25)
CO2: 22 mmol/L (ref 20–31)
Calcium: 9.4 mg/dL (ref 8.6–10.4)
Chloride: 104 mmol/L (ref 98–110)
Creat: 0.66 mg/dL (ref 0.50–0.99)
Glucose, Bld: 297 mg/dL — ABNORMAL HIGH (ref 70–99)
Potassium: 4.4 mmol/L (ref 3.5–5.3)
Sodium: 136 mmol/L (ref 135–146)
Total Bilirubin: 0.6 mg/dL (ref 0.2–1.2)
Total Protein: 6.9 g/dL (ref 6.1–8.1)

## 2016-06-07 LAB — LIPID PANEL
Cholesterol: 131 mg/dL (ref 125–200)
HDL: 51 mg/dL (ref >=46)
LDL Cholesterol: 50 mg/dL (ref <130)
Total CHOL/HDL Ratio: 2.6 Ratio (ref <=5.0)
Triglycerides: 149 mg/dL (ref <150)
VLDL: 30 mg/dL (ref <30)

## 2016-06-07 LAB — HEMOGLOBIN A1C
Hgb A1c MFr Bld: 8.7 % — ABNORMAL HIGH (ref <5.7)
Mean Plasma Glucose: 203 mg/dL

## 2016-06-07 LAB — MAGNESIUM: Magnesium: 1.5 mg/dL (ref 1.5–2.5)

## 2016-06-07 MED ORDER — PANTOPRAZOLE SODIUM 40 MG PO TBEC: DELAYED_RELEASE_TABLET | ORAL | Status: DC

## 2016-06-07 MED ORDER — HYDROCODONE-ACETAMINOPHEN 5-325 MG PO TABS: 1.0000 | ORAL_TABLET | Freq: Two times a day (BID) | ORAL | Status: DC | PRN

## 2016-06-07 NOTE — Assessment & Plan Note (Signed)
Blood pressure looks good and change her medication

## 2016-06-07 NOTE — Patient Instructions (Signed)
F/U 3 months We will call with results

## 2016-06-07 NOTE — Assessment & Plan Note (Addendum)
Episode resolved. She did have some hypo-magnesium will recheck this today. She also had some mild fluid overload I do have her set up to see cardiology in a couple weeks she may need echocardiogram. For now we will continue her losartan HCTZ

## 2016-06-07 NOTE — Progress Notes (Signed)
Patient ID: Miranda DouglasLena B Sampson, female   DOB: 02/28/1951, 65 y.o.   MRN: [id number]    Subjective:    Patient ID: Miranda Sampson, female    DOB: 01/05/1951, [age] y.o.   MRN: [id number]  Patient presents for ER F/U  Patient here for follow-up at her last visit she was sent to the emergency room secondary SVT found in the clinic we tried to break it through carotid massage Valsalva maneuver. By the time EMS arrived her SVT did break and she did not return to SVT by the time she is evaluated in emergency room. Troponins were negative. He was also found to have some peripheral edema her magnesium was low at 1.4. She was started on metoprolol 25 mg as needed for any tachycardia. Seems she took all 5 tablets She was referred to cardiology but does not have an appointment yet.  States she feels much better today, her leg swelling has also gone done  Were unable to go to her regular chronic medical problems including her diabetes which has been uncontrolled last A1c was at 9.2% she is supposed to be on metformin and glipizide  Request refill on pain meds   Review Of Systems:  GEN- denies fatigue, fever, weight loss,weakness, recent illness HEENT- denies eye drainage, change in vision, nasal discharge, CVS- denies chest pain, palpitations RESP- denies SOB, cough, wheeze ABD- denies N/V, change in stools, abd pain GU- denies dysuria, hematuria, dribbling, incontinence MSK- + joint pain, muscle aches, injury Neuro- denies headache, dizziness, syncope, seizure activity       Objective:    BP 128/78 mmHg  Pulse 88  Temp(Src) 97.3 F (36.3 C) (Oral)  Resp 14  Ht 5\' 9"  (1.753 m)  Wt 192 lb (87.091 kg)  BMI 28.34 kg/m2 GEN- NAD, alert and oriented x3 HEENT- PERRL, EOMI, non injected sclera, pink conjunctiva, MMM, oropharynx clear Neck- Supple, no thyromegaly CVS- RRR, no murmur RESP-CTAB ABD-NABS,soft,NT,ND EXT- trace pedal edema Pulses- Radial, DP- 2+        Assessment & Plan:      Problem  List Items Addressed This Visit    SVT (supraventricular tachycardia) (HCC)    Episode resolved. She did have some hypo-magnesium will recheck this today. She also had some mild fluid overload I do have her set up to see cardiology in a couple weeks she may need echocardiogram. For now we will continue her losartan HCTZ      Relevant Orders   Magnesium   Osteoarthrosis, unspecified whether generalized or localized, involving lower leg   Relevant Medications   HYDROcodone-acetaminophen (NORCO/VICODIN) 5-325 MG tablet   Essential hypertension, benign - Primary    Blood pressure looks good and change her medication      Relevant Medications   HYDROcodone-acetaminophen (NORCO/VICODIN) 5-325 MG tablet   Other Relevant Orders   Magnesium   CBC with Differential/Platelet   Comprehensive metabolic panel   Lipid panel   Diabetes mellitus (HCC)    Recheck A1C advised to incessantly check blood sugars daily She is on ARB as well as statin drug      Relevant Medications   HYDROcodone-acetaminophen (NORCO/VICODIN) 5-325 MG tablet   Other Relevant Orders   Hemoglobin A1c   Lipid panel    Other Visit Diagnoses    Need for prophylactic vaccination and inoculation against influenza        Relevant Medications    HYDROcodone-acetaminophen (NORCO/VICODIN) 5-325 MG tablet       Note: This dictation was prepared  with Dragon dictation along with smaller phrase technology. Any transcriptional errors that result from this process are unintentional.

## 2016-06-07 NOTE — Assessment & Plan Note (Signed)
Recheck A1C advised to incessantly check blood sugars daily She is on ARB as well as statin drug

## 2016-06-09 ENCOUNTER — Other Ambulatory Visit: Payer: Self-pay | Admitting: *Deleted

## 2016-06-09 MED ORDER — IBUPROFEN 200 MG PO TABS: 200.0000 mg | ORAL_TABLET | Freq: Two times a day (BID) | ORAL | Status: DC

## 2016-06-09 MED ORDER — EMPAGLIFLOZIN 10 MG PO TABS: 10.0000 mg | ORAL_TABLET | Freq: Every day | ORAL | Status: DC

## 2016-06-20 ENCOUNTER — Ambulatory Visit: Payer: Medicare Other | Admitting: Internal Medicine

## 2016-07-04 ENCOUNTER — Encounter: Payer: Self-pay | Admitting: Cardiology

## 2016-07-04 ENCOUNTER — Telehealth: Payer: Self-pay | Admitting: Family Medicine

## 2016-07-04 ENCOUNTER — Ambulatory Visit (INDEPENDENT_AMBULATORY_CARE_PROVIDER_SITE_OTHER): Payer: Medicare Other | Admitting: Cardiology

## 2016-07-04 VITALS — BP 110/62 | HR 75 | Ht 71.0 in | Wt 185.0 lb

## 2016-07-04 DIAGNOSIS — IMO0002 Reserved for concepts with insufficient information to code with codable children: Secondary | ICD-10-CM

## 2016-07-04 DIAGNOSIS — I10 Essential (primary) hypertension: Secondary | ICD-10-CM

## 2016-07-04 DIAGNOSIS — R002 Palpitations: Secondary | ICD-10-CM

## 2016-07-04 DIAGNOSIS — I471 Supraventricular tachycardia: Secondary | ICD-10-CM | POA: Diagnosis not present

## 2016-07-04 DIAGNOSIS — E119 Type 2 diabetes mellitus without complications: Secondary | ICD-10-CM

## 2016-07-04 DIAGNOSIS — M1710 Unilateral primary osteoarthritis, unspecified knee: Secondary | ICD-10-CM

## 2016-07-04 DIAGNOSIS — Z23 Encounter for immunization: Secondary | ICD-10-CM

## 2016-07-04 MED ORDER — METOPROLOL TARTRATE 25 MG PO TABS: 12.5000 mg | ORAL_TABLET | Freq: Two times a day (BID) | ORAL | 3 refills | Status: DC

## 2016-07-04 MED ORDER — HYDROCODONE-ACETAMINOPHEN 5-325 MG PO TABS: 1.0000 | ORAL_TABLET | Freq: Two times a day (BID) | ORAL | 0 refills | Status: DC | PRN

## 2016-07-04 NOTE — Progress Notes (Signed)
Clinical Summary Ms. Moultry is a 65 y.o.female seen today as a new patient, she is referred by Dr Jeanice Lim.   1. PSVT/Palpitations - ER visit 06/07/16 with SVT. Labs and EKGs independently reviewed - feeling of heart racing, occurs about once a month. Typically lasts 5-10 minutes. Ongoing since her early 29s - had been taking lopressor prn with some improvement in symptoms, but not resolution - denies any caffeine. Occasional EtOH.   Past Medical History:  Diagnosis Date  . Allergy   . Arthritis   . Depression   . Diabetes mellitus   . GERD (gastroesophageal reflux disease)   . Hyperlipidemia   . Hypertension   . Substance abuse    Cocaine quit 2009     No Known Allergies   Current Outpatient Prescriptions  Medication Sig Dispense Refill  . aspirin EC 81 MG tablet Take 81 mg by mouth daily.    . empagliflozin (JARDIANCE) 10 MG TABS tablet Take 10 mg by mouth daily. 30 tablet 3  . glipiZIDE (GLUCOTROL) 10 MG tablet Take 1 tablet (10 mg total) by mouth 2 (two) times daily. 180 tablet 3  . glucose blood (ACCU-CHEK AVIVA) test strip Use as instructed to monitor FSBS 2x daily. Dx: E11.9 100 each 12  . HYDROcodone-acetaminophen (NORCO/VICODIN) 5-325 MG tablet Take 1 tablet by mouth 2 (two) times daily as needed for moderate pain. 60 tablet 0  . ibuprofen (ADVIL,MOTRIN) 200 MG tablet Take 1 tablet (200 mg total) by mouth 2 (two) times daily. 30 tablet 3  . losartan-hydrochlorothiazide (HYZAAR) 50-12.5 MG tablet Take 1 tablet by mouth daily. 90 tablet 2  . metFORMIN (GLUCOPHAGE) 1000 MG tablet Take 1 tablet (1,000 mg total) by mouth 2 (two) times daily. 180 tablet 2  . metoprolol (LOPRESSOR) 25 MG tablet Take 1 tablet (25 mg total) by mouth daily as needed (Fast heart rate). 5 tablet 0  . pantoprazole (PROTONIX) 40 MG tablet TAKE 1 TABLET TWICE DAILY (BEFORE BREAKFAST AND EVENING MEAL) 60 tablet 11  . simvastatin (ZOCOR) 10 MG tablet Take 1 tablet (10 mg total) by mouth at bedtime.  90 tablet 3   No current facility-administered medications for this visit.      Past Surgical History:  Procedure Laterality Date  .  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 ESOPHAGOGASTRODUODENOSCOPY N/A 03/20/2014   Procedure: ESOPHAGOGASTRODUODENOSCOPY (EGD);  Surgeon: Malissa Hippo, MD;  Location: [facility];  Service: Endoscopy;  Laterality: N/A;  140  . TONSILLECTOMY       No Known Allergies    Family History  Problem Relation Age of Onset  . Heart disease Mother   . Diabetes Mother   . Diabetes Father   . Cancer Sister     leukemia   . Colon cancer Neg Hx   . Stomach cancer Neg Hx      Social History Ms. Harper reports that she has never smoked. She has never used smokeless tobacco. Ms. Sahota reports that she drinks alcohol.   Review of Systems CONSTITUTIONAL: No weight loss, fever, chills, weakness or fatigue.  HEENT: Eyes: No visual loss, blurred vision, double vision or yellow sclerae.No hearing loss, sneezing, congestion, runny nose or sore throat.  SKIN: No rash or itching.  CARDIOVASCULAR: per HPI RESPIRATORY: No shortness of breath, cough or sputum.  GASTROINTESTINAL: No anorexia, nausea, vomiting or diarrhea. No abdominal pain or blood.  GENITOURINARY: No burning on urination, no polyuria NEUROLOGICAL: No headache, dizziness, syncope, paralysis, ataxia, numbness  or tingling in the extremities. No change in bowel or bladder control.  MUSCULOSKELETAL: No muscle, back pain, joint pain or stiffness.  LYMPHATICS: No enlarged nodes. No history of splenectomy.  PSYCHIATRIC: No history of depression or anxiety.  ENDOCRINOLOGIC: No reports of sweating, cold or heat intolerance. No polyuria or polydipsia.  Marland Kitchen   Physical Examination Vitals:   07/04/16 1353  BP: 110/62  Pulse: 75   Vitals:   07/04/16 1353  Weight: 185 lb (83.9 kg)  Height: 5\' 11"  (1.803 m)    Gen: resting comfortably, no acute distress HEENT: no scleral icterus, pupils equal round and  reactive, no palptable cervical adenopathy,  CV: RRR, no m/r/g, no jvd Resp: Clear to auscultation bilaterally GI: abdomen is soft, non-tender, non-distended, normal bowel sounds, no hepatosplenomegaly MSK: extremities are warm, no edema.  Skin: warm, no rash Neuro:  no focal deficits Psych: appropriate affect      Assessment and Plan  1. PSVT/Palpitations - still with some ongoing symptoms, we will change lopressor from prn to 12.5mg  bid   F/u 6 months    Antoine Poche, M.D.

## 2016-07-04 NOTE — Telephone Encounter (Signed)
Prescription printed and patient made aware to come to office to pick up on 07/05/2016 per VM.

## 2016-07-04 NOTE — Telephone Encounter (Signed)
Okay to refill? 

## 2016-07-04 NOTE — Patient Instructions (Signed)
Medication Instructions:  START METOPROLOL 12.5 MG TWO TIMES DAILY   Labwork: NONE  Testing/Procedures: NONE  Follow-Up: Your physician wants you to follow-up in: 6 MONTHS .  You will receive a reminder letter in the mail two months in advance. If you don't receive a letter, please call our office to schedule the follow-up appointment.   Any Other Special Instructions Will Be Listed Below (If Applicable).     If you need a refill on your cardiac medications before your next appointment, please call your pharmacy.

## 2016-07-04 NOTE — Telephone Encounter (Signed)
Ok to refill??  Last office visit/ refill 06/07/2016.

## 2016-07-15 ENCOUNTER — Other Ambulatory Visit: Payer: Self-pay | Admitting: Pharmacist

## 2016-07-15 LAB — HM DIABETES EYE EXAM

## 2016-07-15 NOTE — Patient Outreach (Signed)
Outreach call to Miranda Sampson regarding her request for follow up from the [facility] Medication Adherence Campaign. Left a HIPAA compliant message on the patient's voicemail.  Duanne Moron, PharmD Clinical Pharmacist [facility] [phone]

## 2016-08-04 ENCOUNTER — Other Ambulatory Visit: Payer: Self-pay | Admitting: Family Medicine

## 2016-08-04 DIAGNOSIS — I10 Essential (primary) hypertension: Secondary | ICD-10-CM

## 2016-08-04 DIAGNOSIS — M1710 Unilateral primary osteoarthritis, unspecified knee: Secondary | ICD-10-CM

## 2016-08-04 DIAGNOSIS — Z23 Encounter for immunization: Secondary | ICD-10-CM

## 2016-08-04 DIAGNOSIS — E119 Type 2 diabetes mellitus without complications: Secondary | ICD-10-CM

## 2016-08-04 DIAGNOSIS — IMO0002 Reserved for concepts with insufficient information to code with codable children: Secondary | ICD-10-CM

## 2016-08-04 NOTE — Telephone Encounter (Signed)
Ok to refill??  Last office visit 06/07/2016.  Last refill 07/04/2016.

## 2016-08-05 ENCOUNTER — Telehealth: Payer: Self-pay | Admitting: Family Medicine

## 2016-08-05 ENCOUNTER — Encounter: Payer: Self-pay | Admitting: Family Medicine

## 2016-08-05 MED ORDER — HYDROCODONE-ACETAMINOPHEN 5-325 MG PO TABS: 1.0000 | ORAL_TABLET | Freq: Two times a day (BID) | ORAL | 0 refills | Status: DC | PRN

## 2016-08-05 NOTE — Telephone Encounter (Signed)
Prescription printed and patient made aware to come to office to pick up after 2 pm on 08/05/2016. 

## 2016-08-05 NOTE — Telephone Encounter (Signed)
Okay to refill? 

## 2016-09-07 ENCOUNTER — Telehealth: Payer: Self-pay | Admitting: *Deleted

## 2016-09-07 ENCOUNTER — Ambulatory Visit (INDEPENDENT_AMBULATORY_CARE_PROVIDER_SITE_OTHER): Payer: Medicare Other | Admitting: Family Medicine

## 2016-09-07 ENCOUNTER — Encounter: Payer: Self-pay | Admitting: Family Medicine

## 2016-09-07 VITALS — BP 138/68 | HR 74 | Temp 98.5°F | Resp 16 | Ht 71.0 in | Wt 184.0 lb

## 2016-09-07 DIAGNOSIS — I10 Essential (primary) hypertension: Secondary | ICD-10-CM

## 2016-09-07 DIAGNOSIS — IMO0002 Reserved for concepts with insufficient information to code with codable children: Secondary | ICD-10-CM

## 2016-09-07 DIAGNOSIS — M179 Osteoarthritis of knee, unspecified: Secondary | ICD-10-CM

## 2016-09-07 DIAGNOSIS — E785 Hyperlipidemia, unspecified: Secondary | ICD-10-CM

## 2016-09-07 DIAGNOSIS — E119 Type 2 diabetes mellitus without complications: Secondary | ICD-10-CM

## 2016-09-07 DIAGNOSIS — Z23 Encounter for immunization: Secondary | ICD-10-CM

## 2016-09-07 DIAGNOSIS — M1710 Unilateral primary osteoarthritis, unspecified knee: Secondary | ICD-10-CM

## 2016-09-07 LAB — CBC WITH DIFFERENTIAL/PLATELET
Basophils Absolute: 0 cells/uL (ref 0–200)
Basophils Relative: 0 %
Eosinophils Absolute: 78 cells/uL (ref 15–500)
Eosinophils Relative: 1 %
HCT: 47.3 % — ABNORMAL HIGH (ref 35.0–45.0)
Hemoglobin: 15.6 g/dL — ABNORMAL HIGH (ref 12.0–15.0)
Lymphocytes Relative: 19 %
Lymphs Abs: 1482 cells/uL (ref 850–3900)
MCH: 32.4 pg (ref 27.0–33.0)
MCHC: 33 g/dL (ref 32.0–36.0)
MCV: 98.1 fL (ref 80.0–100.0)
MPV: 11 fL (ref 7.5–12.5)
Monocytes Absolute: 390 cells/uL (ref 200–950)
Monocytes Relative: 5 %
Neutro Abs: 5850 cells/uL (ref 1500–7800)
Neutrophils Relative %: 75 %
Platelets: 265 10*3/uL (ref 140–400)
RBC: 4.82 MIL/uL (ref 3.80–5.10)
RDW: 13.1 % (ref 11.0–15.0)
WBC: 7.8 10*3/uL (ref 3.8–10.8)

## 2016-09-07 LAB — COMPREHENSIVE METABOLIC PANEL
ALT: 6 U/L (ref 6–29)
AST: 10 U/L (ref 10–35)
Albumin: 3.8 g/dL (ref 3.6–5.1)
Alkaline Phosphatase: 72 U/L (ref 33–130)
BUN: 20 mg/dL (ref 7–25)
CO2: 24 mmol/L (ref 20–31)
Calcium: 9.7 mg/dL (ref 8.6–10.4)
Chloride: 104 mmol/L (ref 98–110)
Creat: 0.79 mg/dL (ref 0.50–0.99)
Glucose, Bld: 177 mg/dL — ABNORMAL HIGH (ref 70–99)
Potassium: 4.6 mmol/L (ref 3.5–5.3)
Sodium: 138 mmol/L (ref 135–146)
Total Bilirubin: 0.6 mg/dL (ref 0.2–1.2)
Total Protein: 7.2 g/dL (ref 6.1–8.1)

## 2016-09-07 MED ORDER — LOSARTAN POTASSIUM-HCTZ 50-12.5 MG PO TABS: 1.0000 | ORAL_TABLET | Freq: Every day | ORAL | 2 refills | Status: DC

## 2016-09-07 MED ORDER — GLIPIZIDE 10 MG PO TABS: 10.0000 mg | ORAL_TABLET | Freq: Two times a day (BID) | ORAL | 3 refills | Status: DC

## 2016-09-07 MED ORDER — HYDROCODONE-ACETAMINOPHEN 5-325 MG PO TABS: ORAL_TABLET | ORAL | 0 refills | Status: DC

## 2016-09-07 MED ORDER — EMPAGLIFLOZIN 10 MG PO TABS: 10.0000 mg | ORAL_TABLET | Freq: Every day | ORAL | 3 refills | Status: DC

## 2016-09-07 MED ORDER — PANTOPRAZOLE SODIUM 40 MG PO TBEC: DELAYED_RELEASE_TABLET | ORAL | 11 refills | Status: DC

## 2016-09-07 MED ORDER — METFORMIN HCL 1000 MG PO TABS: 1000.0000 mg | ORAL_TABLET | Freq: Two times a day (BID) | ORAL | 2 refills | Status: DC

## 2016-09-07 MED ORDER — SIMVASTATIN 10 MG PO TABS: 10.0000 mg | ORAL_TABLET | Freq: Every day | ORAL | 3 refills | Status: DC

## 2016-09-07 MED ORDER — DICLOFENAC POTASSIUM 50 MG PO TABS: 50.0000 mg | ORAL_TABLET | Freq: Two times a day (BID) | ORAL | 6 refills | Status: DC

## 2016-09-07 MED ORDER — METOPROLOL TARTRATE 25 MG PO TABS: 12.5000 mg | ORAL_TABLET | Freq: Two times a day (BID) | ORAL | 3 refills | Status: DC

## 2016-09-07 NOTE — Assessment & Plan Note (Signed)
Diabetes is uncontrolled she did not bring her meter with her today reiterated bringing in her readings. Recheck her A1c today her cholesterol has been well controlled and at goal. She is also on baby aspirin

## 2016-09-07 NOTE — Assessment & Plan Note (Signed)
With the concern from her daughter calling in and her history of cocaine abuse she does admit to taking too much of her medication I'm getting a decreased her hydrated code and down to 30 tablets per month. I'm also going to start diclofenac 50 mg twice a day as an anti-inflammatory she is not to take any other NSAIDs. She states that often she will take Aleve when she just runs out of her medications. She is already on a proton pump inhibitor.

## 2016-09-07 NOTE — Telephone Encounter (Signed)
Received call from patient daughter margaret.   Reports that she thinks patient is abusing Hydrocodone and is becoming too dependant on medication.   States that patient is running out before the month end due to using more than 2x daily.   MD to be made aware.

## 2016-09-07 NOTE — Progress Notes (Signed)
Subjective:    Patient ID: Miranda Sampson, female    DOB: 06/27/1951, 65 y.o.   MRN: [id number]  Patient presents for 3 month F/U (is not fasting) Patient to follow chronic medical problems. She has no particular concerns. I reviewed her cardiology note after the SVT she is supposed to be on metoprolol twice a day she states she been taking it once a day. Of note I also received a phone call from her daughter earlier today who left a message stating that she is concerned her mother is abusing her pain medication. She does have history of cocaine addiction in the past. I was unable to speak to her daughter myself I did leave a message for her to return my call. The patient states that she has been taking upwards of 2 or 3 tablets a day. She gets 60 tablets for the month and she never calls in requesting any early refills. She states that she hurts all over in her knees and her back which she has known generalized osteoarthritis.  Diabetes mellitus she did not bring her meter with her today states that her blood sugars have been okay the highest it has been is up into the 200s. She is taking metformin and glipizide and the jardiance    Review Of Systems:  GEN- denies fatigue, fever, weight loss,weakness, recent illness HEENT- denies eye drainage, change in vision, nasal discharge, CVS- denies chest pain, palpitations RESP- denies SOB, cough, wheeze ABD- denies N/V, change in stools, abd pain GU- denies dysuria, hematuria, dribbling, incontinence MSK- + joint pain, muscle aches, injury Neuro- denies headache, dizziness, syncope, seizure activity       Objective:    BP 138/68 (BP Location: Left Arm, Patient Position: Sitting, Cuff Size: Normal)   Pulse 74   Temp 98.5 F (36.9 C) (Oral)   Resp 16   Ht 5\' 11"  (1.803 m)   Wt 184 lb (83.5 kg)   SpO2 97% Comment: RA  BMI 25.66 kg/m  GEN- NAD, alert and oriented x3 HEENT- PERRL, EOMI, non injected sclera, pink conjunctiva, MMM, oropharynx  clear CVS- RRR, no murmur RESP-CTAB MSK- bilat knees, fair ROM, no effusion, no crepitus  EXT- No edema Pulses- Radial, DP- 2+        Assessment & Plan:      Problem List Items Addressed This Visit    Osteoarthrosis, unspecified whether generalized or localized, involving lower leg    With the concern from her daughter calling in and her history of cocaine abuse she does admit to taking too much of her medication I'm getting a decreased her hydrated code and down to 30 tablets per month. I'm also going to start diclofenac 50 mg twice a day as an anti-inflammatory she is not to take any other NSAIDs. She states that often she will take Aleve when she just runs out of her medications. She is already on a proton pump inhibitor.      Relevant Medications   HYDROcodone-acetaminophen (NORCO/VICODIN) 5-325 MG tablet   diclofenac (CATAFLAM) 50 MG tablet   Hyperlipidemia - Primary   Relevant Medications   losartan-hydrochlorothiazide (HYZAAR) 50-12.5 MG tablet   metoprolol tartrate (LOPRESSOR) 25 MG tablet   simvastatin (ZOCOR) 10 MG tablet   Essential hypertension, benign   Relevant Medications   HYDROcodone-acetaminophen (NORCO/VICODIN) 5-325 MG tablet   losartan-hydrochlorothiazide (HYZAAR) 50-12.5 MG tablet   metoprolol tartrate (LOPRESSOR) 25 MG tablet   simvastatin (ZOCOR) 10 MG tablet   Other Relevant Orders  CBC with Differential/Platelet   Comprehensive metabolic panel   Diabetes mellitus (HCC)    Diabetes is uncontrolled she did not bring her meter with her today reiterated bringing in her readings. Recheck her A1c today her cholesterol has been well controlled and at goal. She is also on baby aspirin      Relevant Medications   HYDROcodone-acetaminophen (NORCO/VICODIN) 5-325 MG tablet   glipiZIDE (GLUCOTROL) 10 MG tablet   empagliflozin (JARDIANCE) 10 MG TABS tablet   losartan-hydrochlorothiazide (HYZAAR) 50-12.5 MG tablet   metFORMIN (GLUCOPHAGE) 1000 MG tablet    simvastatin (ZOCOR) 10 MG tablet   Other Relevant Orders   Hemoglobin A1c    Other Visit Diagnoses    Need for prophylactic vaccination and inoculation against influenza       Relevant Medications   HYDROcodone-acetaminophen (NORCO/VICODIN) 5-325 MG tablet   Other Relevant Orders   Flu Vaccine QUAD 36+ mos IM (Fluarix & Fluzone Quad PF (Completed)      Note: This dictation was prepared with Dragon dictation along with smaller Lobbyist. Any transcriptional errors that result from this process are unintentional.

## 2016-09-07 NOTE — Telephone Encounter (Signed)
I Left message for pt daughter to call me

## 2016-09-07 NOTE — Patient Instructions (Signed)
Pain pills have been decreased to 1 a day  Take all your other medications as prescribed New arthritis pill take twice a day as needed- Diclofenac FLu shot given F/U 4 months

## 2016-09-08 LAB — HEMOGLOBIN A1C
Hgb A1c MFr Bld: 8.6 % — ABNORMAL HIGH (ref <5.7)
Mean Plasma Glucose: 200 mg/dL

## 2016-10-07 ENCOUNTER — Telehealth: Payer: Self-pay | Admitting: Family Medicine

## 2016-10-07 DIAGNOSIS — Z23 Encounter for immunization: Secondary | ICD-10-CM

## 2016-10-07 DIAGNOSIS — E119 Type 2 diabetes mellitus without complications: Secondary | ICD-10-CM

## 2016-10-07 DIAGNOSIS — I10 Essential (primary) hypertension: Secondary | ICD-10-CM

## 2016-10-07 MED ORDER — HYDROCODONE-ACETAMINOPHEN 5-325 MG PO TABS: ORAL_TABLET | ORAL | 0 refills | Status: DC

## 2016-10-07 NOTE — Telephone Encounter (Signed)
okay

## 2016-10-07 NOTE — Telephone Encounter (Signed)
Ok to refill??  Last office visit/ refill 09/07/2016. 

## 2016-10-07 NOTE — Telephone Encounter (Signed)
Patient would like rx for her hydrocodone  [phone]

## 2016-10-07 NOTE — Telephone Encounter (Signed)
Prescription printed and patient made aware to come to office to pick up on 10/10/2016. 

## 2016-11-08 ENCOUNTER — Telehealth: Payer: Self-pay | Admitting: Family Medicine

## 2016-11-08 DIAGNOSIS — E119 Type 2 diabetes mellitus without complications: Secondary | ICD-10-CM

## 2016-11-08 DIAGNOSIS — Z23 Encounter for immunization: Secondary | ICD-10-CM

## 2016-11-08 DIAGNOSIS — I10 Essential (primary) hypertension: Secondary | ICD-10-CM

## 2016-11-08 MED ORDER — HYDROCODONE-ACETAMINOPHEN 5-325 MG PO TABS: ORAL_TABLET | ORAL | 0 refills | Status: DC

## 2016-11-08 NOTE — Telephone Encounter (Signed)
CB# [phone]  Patient requesting a refill on her hydrocodone

## 2016-11-08 NOTE — Telephone Encounter (Signed)
Ok to refill??  Last office visit 09/07/2016.   Last refill 10/07/2016.

## 2016-11-08 NOTE — Telephone Encounter (Signed)
okay

## 2016-11-08 NOTE — Telephone Encounter (Signed)
Prescription printed and patient made aware to come to office to pick up on 11/09/2016.

## 2016-11-30 ENCOUNTER — Telehealth: Payer: Self-pay | Admitting: *Deleted

## 2016-11-30 DIAGNOSIS — Z23 Encounter for immunization: Secondary | ICD-10-CM

## 2016-11-30 DIAGNOSIS — E119 Type 2 diabetes mellitus without complications: Secondary | ICD-10-CM

## 2016-11-30 DIAGNOSIS — I10 Essential (primary) hypertension: Secondary | ICD-10-CM

## 2016-11-30 MED ORDER — HYDROCODONE-ACETAMINOPHEN 5-325 MG PO TABS: ORAL_TABLET | ORAL | 0 refills | Status: DC

## 2016-11-30 NOTE — Telephone Encounter (Signed)
Received call from patient.   Requested refill on Norco.   Ok to refill??  Last office visit 09/07/2016.  Last refill 11/08/2016.

## 2016-11-30 NOTE — Telephone Encounter (Signed)
Make sure fill date is noted Okay to refill

## 2016-11-30 NOTE — Telephone Encounter (Signed)
Prescription printed and patient made aware to come to office to pick up on 12/07/2016. 

## 2017-01-03 ENCOUNTER — Telehealth: Payer: Self-pay | Admitting: *Deleted

## 2017-01-03 NOTE — Telephone Encounter (Signed)
Received VM from patient.   Inquired as to making appointment with PCP.   Please contact her at [phone].

## 2017-01-09 ENCOUNTER — Encounter: Payer: Self-pay | Admitting: Family Medicine

## 2017-01-09 ENCOUNTER — Ambulatory Visit (INDEPENDENT_AMBULATORY_CARE_PROVIDER_SITE_OTHER): Payer: Medicare Other | Admitting: Family Medicine

## 2017-01-09 VITALS — BP 138/78 | HR 70 | Temp 98.5°F | Resp 18 | Ht 71.0 in | Wt 200.0 lb

## 2017-01-09 DIAGNOSIS — IMO0002 Reserved for concepts with insufficient information to code with codable children: Secondary | ICD-10-CM

## 2017-01-09 DIAGNOSIS — I10 Essential (primary) hypertension: Secondary | ICD-10-CM | POA: Diagnosis not present

## 2017-01-09 DIAGNOSIS — M1710 Unilateral primary osteoarthritis, unspecified knee: Secondary | ICD-10-CM

## 2017-01-09 DIAGNOSIS — R635 Abnormal weight gain: Secondary | ICD-10-CM

## 2017-01-09 DIAGNOSIS — E119 Type 2 diabetes mellitus without complications: Secondary | ICD-10-CM | POA: Diagnosis not present

## 2017-01-09 LAB — CBC WITH DIFFERENTIAL/PLATELET
Basophils Absolute: 0 cells/uL (ref 0–200)
Basophils Relative: 0 %
Eosinophils Absolute: 0 cells/uL — ABNORMAL LOW (ref 15–500)
Eosinophils Relative: 0 %
HCT: 42.2 % (ref 35.0–45.0)
Hemoglobin: 13.9 g/dL (ref 12.0–15.0)
Lymphocytes Relative: 17 %
Lymphs Abs: 1207 cells/uL (ref 850–3900)
MCH: 33.1 pg — ABNORMAL HIGH (ref 27.0–33.0)
MCHC: 32.9 g/dL (ref 32.0–36.0)
MCV: 100.5 fL — ABNORMAL HIGH (ref 80.0–100.0)
MPV: 11.2 fL (ref 7.5–12.5)
Monocytes Absolute: 497 cells/uL (ref 200–950)
Monocytes Relative: 7 %
Neutro Abs: 5396 cells/uL (ref 1500–7800)
Neutrophils Relative %: 76 %
Platelets: 287 10*3/uL (ref 140–400)
RBC: 4.2 MIL/uL (ref 3.80–5.10)
RDW: 13.1 % (ref 11.0–15.0)
WBC: 7.1 10*3/uL (ref 3.8–10.8)

## 2017-01-09 LAB — COMPREHENSIVE METABOLIC PANEL
ALT: 13 U/L (ref 6–29)
AST: 12 U/L (ref 10–35)
Albumin: 3.7 g/dL (ref 3.6–5.1)
Alkaline Phosphatase: 67 U/L (ref 33–130)
BUN: 26 mg/dL — ABNORMAL HIGH (ref 7–25)
CO2: 21 mmol/L (ref 20–31)
Calcium: 9.3 mg/dL (ref 8.6–10.4)
Chloride: 110 mmol/L (ref 98–110)
Creat: 0.74 mg/dL (ref 0.50–0.99)
Glucose, Bld: 133 mg/dL — ABNORMAL HIGH (ref 70–99)
Potassium: 4.6 mmol/L (ref 3.5–5.3)
Sodium: 139 mmol/L (ref 135–146)
Total Bilirubin: 0.5 mg/dL (ref 0.2–1.2)
Total Protein: 7.1 g/dL (ref 6.1–8.1)

## 2017-01-09 LAB — HEMOGLOBIN A1C
Hgb A1c MFr Bld: 7.8 % — ABNORMAL HIGH (ref <5.7)
Mean Plasma Glucose: 177 mg/dL

## 2017-01-09 LAB — LIPID PANEL
Cholesterol: 129 mg/dL (ref <200)
HDL: 54 mg/dL (ref >50)
LDL Cholesterol: 65 mg/dL (ref <100)
Total CHOL/HDL Ratio: 2.4 Ratio (ref <5.0)
Triglycerides: 51 mg/dL (ref <150)
VLDL: 10 mg/dL (ref <30)

## 2017-01-09 MED ORDER — HYDROCODONE-ACETAMINOPHEN 5-325 MG PO TABS: ORAL_TABLET | ORAL | 0 refills | Status: DC

## 2017-01-09 MED ORDER — GLUCOSE BLOOD VI STRP: ORAL_STRIP | 12 refills | Status: AC

## 2017-01-09 MED ORDER — METOPROLOL TARTRATE 25 MG PO TABS: 12.5000 mg | ORAL_TABLET | Freq: Two times a day (BID) | ORAL | 3 refills | Status: DC

## 2017-01-09 MED ORDER — METFORMIN HCL 1000 MG PO TABS: 1000.0000 mg | ORAL_TABLET | Freq: Two times a day (BID) | ORAL | 2 refills | Status: DC

## 2017-01-09 MED ORDER — PANTOPRAZOLE SODIUM 40 MG PO TBEC: DELAYED_RELEASE_TABLET | ORAL | 11 refills | Status: DC

## 2017-01-09 MED ORDER — EMPAGLIFLOZIN 10 MG PO TABS: 10.0000 mg | ORAL_TABLET | Freq: Every day | ORAL | 3 refills | Status: DC

## 2017-01-09 MED ORDER — LOSARTAN POTASSIUM-HCTZ 50-12.5 MG PO TABS: 1.0000 | ORAL_TABLET | Freq: Every day | ORAL | 2 refills | Status: DC

## 2017-01-09 MED ORDER — SIMVASTATIN 10 MG PO TABS: 10.0000 mg | ORAL_TABLET | Freq: Every day | ORAL | 3 refills | Status: DC

## 2017-01-09 MED ORDER — DICLOFENAC POTASSIUM 50 MG PO TABS: 50.0000 mg | ORAL_TABLET | Freq: Two times a day (BID) | ORAL | 6 refills | Status: DC

## 2017-01-09 NOTE — Progress Notes (Signed)
   Subjective:    Patient ID: Miranda DouglasLena B Sampson, female    DOB: 11/13/1951, 66 y.o.   MRN: [id number]  Patient presents for 4 month F/U (is not fasting)  DM- A1C 8.6%, does not have strips has not been checking    Taking all 3 medications per report Has gained 16lbs since last visit She states that she had a lot of sweets over the holidays  OA - currently on norco #30 tablets a day, 2/2 concern for abuse of medicaitons, taking too many pills, also received a call from family member about this. She was asking for increased on her pill count  HTN- taking BP medications , and cholesterol medications     Review Of Systems:  GEN- denies fatigue, fever, weight loss,weakness, recent illness HEENT- denies eye drainage, change in vision, nasal discharge, CVS- denies chest pain, palpitations RESP- denies SOB, cough, wheeze ABD- denies N/V, change in stools, abd pain GU- denies dysuria, hematuria, dribbling, incontinence MSK- +joint pain, muscle aches, injury Neuro- denies headache, dizziness, syncope, seizure activity       Objective:    BP 138/78 (BP Location: Left Arm, Patient Position: Sitting, Cuff Size: Large)   Pulse 70   Temp 98.5 F (36.9 C) (Oral)   Resp 18   Ht 5\' 11"  (1.803 m)   Wt 200 lb (90.7 kg)   SpO2 97%   BMI 27.89 kg/m  GEN- NAD, alert and oriented x3 HEENT- PERRL, EOMI, non injected sclera, pink conjunctiva, MMM, oropharynx clear Neck- Supple, no thyromegaly CVS- RRR, no murmur RESP-CTAB EXT- No edema Pulses- Radial, DP- 2+        Assessment & Plan:      Problem List Items Addressed This Visit    Osteoarthrosis involving lower leg - Primary    I think she is too high risk for abuse of her medication there from the keeper at 30 tablets a month of the hydrocodone she also has anti-inflammatory diclofenac which she has not been taking.      Relevant Medications   HYDROcodone-acetaminophen (NORCO/VICODIN) 5-325 MG tablet   diclofenac (CATAFLAM) 50 MG  tablet   Essential hypertension, benign    Blood pressure well controlled medication medication      Relevant Medications   HYDROcodone-acetaminophen (NORCO/VICODIN) 5-325 MG tablet   simvastatin (ZOCOR) 10 MG tablet   metoprolol tartrate (LOPRESSOR) 25 MG tablet   losartan-hydrochlorothiazide (HYZAAR) 50-12.5 MG tablet   Diabetes mellitus (HCC)    Diabetes will recheck her A1c today she is not been checking her blood sugars discussed importance of checking blood sugar and a regular basis. Goal is less than 7%      Relevant Medications   HYDROcodone-acetaminophen (NORCO/VICODIN) 5-325 MG tablet   simvastatin (ZOCOR) 10 MG tablet   metFORMIN (GLUCOPHAGE) 1000 MG tablet   losartan-hydrochlorothiazide (HYZAAR) 50-12.5 MG tablet   empagliflozin (JARDIANCE) 10 MG TABS tablet   Other Relevant Orders   CBC with Differential/Platelet   Comprehensive metabolic panel   Hemoglobin A1c   Lipid panel    Other Visit Diagnoses    Weight gain          Note: This dictation was prepared with Dragon dictation along with smaller phrase technology. Any transcriptional errors that result from this process are unintentional.

## 2017-01-09 NOTE — Patient Instructions (Signed)
F/U  3 months 

## 2017-01-09 NOTE — Assessment & Plan Note (Addendum)
Diabetes will recheck her A1c today she is not been checking her blood sugars discussed importance of checking blood sugar and a regular basis. Goal is less than 7% Discussed healthy eating decreasing her sugar and sweets

## 2017-01-09 NOTE — Assessment & Plan Note (Signed)
Blood pressure well controlled medication medication 

## 2017-01-09 NOTE — Assessment & Plan Note (Addendum)
I think she is too high risk for abuse of her medication there from the keeper at 30 tablets a month of the hydrocodone she also has anti-inflammatory diclofenac which she has not been taking.

## 2017-02-07 ENCOUNTER — Telehealth: Payer: Self-pay | Admitting: *Deleted

## 2017-02-07 DIAGNOSIS — E119 Type 2 diabetes mellitus without complications: Secondary | ICD-10-CM

## 2017-02-07 DIAGNOSIS — I10 Essential (primary) hypertension: Secondary | ICD-10-CM

## 2017-02-07 MED ORDER — HYDROCODONE-ACETAMINOPHEN 5-325 MG PO TABS: ORAL_TABLET | ORAL | 0 refills | Status: DC

## 2017-02-07 NOTE — Telephone Encounter (Signed)
Received VM from patient.   Requested refill on Hydrocodone.   Ok to refill??  Last office visit/ refill 01/09/2017.

## 2017-02-07 NOTE — Telephone Encounter (Signed)
Prescription printed and patient made aware to come to office to pick up on 02/08/2017.

## 2017-02-07 NOTE — Telephone Encounter (Signed)
Okay to refill? 

## 2017-02-08 ENCOUNTER — Telehealth: Payer: Self-pay | Admitting: Family Medicine

## 2017-02-15 NOTE — Telephone Encounter (Signed)
Clearing off encounter   °

## 2017-03-09 ENCOUNTER — Telehealth: Payer: Self-pay | Admitting: *Deleted

## 2017-03-09 DIAGNOSIS — I10 Essential (primary) hypertension: Secondary | ICD-10-CM

## 2017-03-09 DIAGNOSIS — E119 Type 2 diabetes mellitus without complications: Secondary | ICD-10-CM

## 2017-03-09 MED ORDER — HYDROCODONE-ACETAMINOPHEN 5-325 MG PO TABS: ORAL_TABLET | ORAL | 0 refills | Status: DC

## 2017-03-09 NOTE — Telephone Encounter (Signed)
Received call from patient.   Requested refill on Hydrocodone.   Ok to refill??  Last office visit 01/09/2017.  Last refill 02/07/2017.

## 2017-03-09 NOTE — Telephone Encounter (Signed)
Okay to refill? 

## 2017-03-09 NOTE — Telephone Encounter (Signed)
Prescription printed and patient made aware to come to office to pick up on 03/13/2017.

## 2017-03-15 LAB — HM DIABETES EYE EXAM

## 2017-04-07 ENCOUNTER — Encounter: Payer: Self-pay | Admitting: Family Medicine

## 2017-04-10 ENCOUNTER — Ambulatory Visit: Payer: Self-pay | Admitting: Family Medicine

## 2017-04-11 ENCOUNTER — Other Ambulatory Visit: Payer: Self-pay | Admitting: Family Medicine

## 2017-04-11 ENCOUNTER — Encounter: Payer: Self-pay | Admitting: Family Medicine

## 2017-04-11 ENCOUNTER — Ambulatory Visit (INDEPENDENT_AMBULATORY_CARE_PROVIDER_SITE_OTHER): Payer: Medicare Other | Admitting: Family Medicine

## 2017-04-11 VITALS — BP 138/82 | HR 78 | Temp 98.4°F | Resp 16 | Ht 71.0 in | Wt 193.0 lb

## 2017-04-11 DIAGNOSIS — E119 Type 2 diabetes mellitus without complications: Secondary | ICD-10-CM | POA: Diagnosis not present

## 2017-04-11 DIAGNOSIS — I10 Essential (primary) hypertension: Secondary | ICD-10-CM

## 2017-04-11 DIAGNOSIS — G894 Chronic pain syndrome: Secondary | ICD-10-CM

## 2017-04-11 DIAGNOSIS — J301 Allergic rhinitis due to pollen: Secondary | ICD-10-CM

## 2017-04-11 MED ORDER — EMPAGLIFLOZIN 10 MG PO TABS: 10.0000 mg | ORAL_TABLET | Freq: Every day | ORAL | 3 refills | Status: DC

## 2017-04-11 MED ORDER — LORATADINE 10 MG PO TABS: 10.0000 mg | ORAL_TABLET | Freq: Every day | ORAL | 3 refills | Status: DC

## 2017-04-11 MED ORDER — HYDROCODONE-ACETAMINOPHEN 5-325 MG PO TABS: ORAL_TABLET | ORAL | 0 refills | Status: DC

## 2017-04-11 NOTE — Progress Notes (Signed)
   Subjective:    Patient ID: Miranda Sampson, female    DOB: [birth year], 66 y.o.   MRN: [id number]  Patient presents for Follow-up (is not fasting) and Illness (x1 month- sinus pressure/ congestion- allergies)   DM- last A1C was 7.8%, has metformin and glipziide, out of jardiance not sure how long (we called pharmacy last filled in Nov with 90 day supply)   CBG- 178 today, does not check regularly    HTN- taking BP meds per report, does not have any meds with her today      Sinus pressure congestion for past month, taking cold and sinus pills, no fever, sneezing, occ cough, no anti-histamines taken, no fever       Liipids at goal LDL 65  Request refill on pain meds    Review Of Systems:  GEN- denies fatigue, fever, weight loss,weakness, recent illness HEENT- denies eye drainage, change in vision, +nasal discharge, CVS- denies chest pain, palpitations RESP- denies SOB, +cough, wheeze ABD- denies N/V, change in stools, abd pain GU- denies dysuria, hematuria, dribbling, incontinence MSK- denies joint pain, muscle aches, injury Neuro- denies headache, dizziness, syncope, seizure activity       Objective:    BP 138/82   Pulse 78   Temp 98.4 F (36.9 C) (Oral)   Resp 16   Ht  (1.803 m)   Wt 193 lb (87.5 kg)   SpO2 98%   BMI 26.92 kg/m  GEN- NAD, alert and oriented x3 HEENT- PERRL, EOMI, non injected sclera, pink conjunctiva, MMM, oropharynx clear, no maxillary sinus tenderness, TM clear no effusion, nares clear  Neck- Supple, no thyromegaly, no LAD  CVS- RRR, no murmur RESP-CTAB EXT- No edema Pulses- Radial, DP- 2+        Assessment & Plan:      Problem List Items Addressed This Visit    Essential hypertension, benign - Primary    Fair control, unclear which meds she is taking regulary, so will not change Advised to bring all meds with her to visit and glucometer      Relevant Medications   HYDROcodone-acetaminophen (NORCO/VICODIN) 5-325 MG tablet   Diabetes mellitus (HCC)    Uncontrolled, expect A1C to be higher as no jardiance since Feb based on fill date Instructed to use all 3 medications  Urine micro and Cr check today      Relevant Medications   HYDROcodone-acetaminophen (NORCO/VICODIN) 5-325 MG tablet   empagliflozin (JARDIANCE) 10 MG TABS tablet   Other Relevant Orders   Comprehensive metabolic panel   Hemoglobin A1c   Microalbumin / creatinine urine ratio   Chronic pain disorder    Pain medication refilled After sample left for UDS on pain contact       Relevant Orders   Drugs of abuse screen w/o alc, rtn urine-sln    Other Visit Diagnoses    Seasonal allergic rhinitis due to pollen       Claritin, no sign of sinus infection      Note: This dictation was prepared with Dragon dictation along with smaller phrase technology. Any transcriptional errors that result from this process are unintentional.

## 2017-04-11 NOTE — Assessment & Plan Note (Signed)
Fair control, unclear which meds she is taking regulary, so will not change Advised to bring all meds with her to visit and glucometer

## 2017-04-11 NOTE — Assessment & Plan Note (Signed)
Pain medication refilled After sample left for UDS on pain contact

## 2017-04-11 NOTE — Assessment & Plan Note (Signed)
Uncontrolled, expect A1C to be higher as no jardiance since Feb based on fill date Instructed to use all 3 medications  Urine micro and Cr check today

## 2017-04-11 NOTE — Patient Instructions (Addendum)
F/U 4 months Physical  Claritin for allergies Get the jardiance

## 2017-04-12 LAB — CMP 10231
AG Ratio: 1.1 Ratio (ref 1.0–2.5)
ALT: 7 U/L (ref 6–29)
AST: 9 U/L — ABNORMAL LOW (ref 10–35)
Albumin: 3.6 g/dL (ref 3.6–5.1)
Alkaline Phosphatase: 72 U/L (ref 33–130)
BUN/Creatinine Ratio: 24.3 Ratio — ABNORMAL HIGH (ref 6–22)
BUN: 18 mg/dL (ref 7–25)
CO2: 23 mmol/L (ref 20–31)
Calcium: 8.8 mg/dL (ref 8.6–10.4)
Chloride: 106 mmol/L (ref 98–110)
Creat: 0.74 mg/dL (ref 0.50–0.99)
GFR, Est African American: >89 mL/min (ref >=60)
GFR, Est Non African American: 84 mL/min (ref >=60)
Globulin: 3.4 g/dL (ref 1.9–3.7)
Glucose, Bld: 386 mg/dL — ABNORMAL HIGH (ref 70–99)
Potassium: 4.7 mmol/L (ref 3.5–5.3)
Sodium: 136 mmol/L (ref 135–146)
Total Bilirubin: 0.8 mg/dL (ref 0.2–1.2)
Total Protein: 7 g/dL (ref 6.1–8.1)

## 2017-04-12 LAB — HEMOGLOBIN A1C
Hgb A1c MFr Bld: 9.3 % — ABNORMAL HIGH (ref <5.7)
Mean Plasma Glucose: 220 mg/dL

## 2017-04-12 LAB — MICROALBUMIN / CREATININE URINE RATIO
Creatinine, Urine: 79 mg/dL (ref 20–320)
Microalb Creat Ratio: 18 mcg/mg creat (ref <30)
Microalb, Ur: 1.4 mg/dL

## 2017-04-13 LAB — DRUG ABUSE PANEL 10-50 NO CONF, U
AMPHETAMINES (1000 ng/mL SCRN): NEGATIVE
BARBITURATES: NEGATIVE
BENZODIAZEPINES: NEGATIVE
COCAINE METABOLITES: POSITIVE — AB
MARIJUANA MET (50 ng/mL SCRN): NEGATIVE
METHADONE: NEGATIVE
METHAQUALONE: NEGATIVE
OPIATES: NEGATIVE
PHENCYCLIDINE: NEGATIVE
PROPOXYPHENE: NEGATIVE

## 2017-04-19 ENCOUNTER — Ambulatory Visit: Payer: Self-pay | Admitting: Family Medicine

## 2017-05-05 ENCOUNTER — Encounter: Payer: Self-pay | Admitting: Family Medicine

## 2017-05-09 ENCOUNTER — Encounter: Payer: Self-pay | Admitting: Family Medicine

## 2017-05-12 ENCOUNTER — Encounter: Payer: Self-pay | Admitting: Family Medicine

## 2017-05-12 ENCOUNTER — Ambulatory Visit (INDEPENDENT_AMBULATORY_CARE_PROVIDER_SITE_OTHER): Payer: Medicare Other | Admitting: Family Medicine

## 2017-05-12 VITALS — BP 146/70 | HR 110 | Temp 98.4°F | Resp 14 | Ht 71.0 in | Wt 197.0 lb

## 2017-05-12 DIAGNOSIS — E119 Type 2 diabetes mellitus without complications: Secondary | ICD-10-CM | POA: Diagnosis not present

## 2017-05-12 DIAGNOSIS — F1410 Cocaine abuse, uncomplicated: Secondary | ICD-10-CM | POA: Insufficient documentation

## 2017-05-12 NOTE — Assessment & Plan Note (Signed)
Advised to take her medications as directed, avoid all illegal substances. I tried to discuss with her her recent behind going back to cocaine. She just states that her sister died about a year ago and she was some friends come back over when she was feeling sad and they've been bringing her drugs. She knows that she was to get clean. She is planning to go to mental health out in [place]. I offered her other resources for rehabilitation including some inpatient but she declined this. I did give her a handout which did include [facility] and [facility]. He states that she plans to talk to her children today. I discussed with her the interactions of her medications with cocaine the possibility of having massive heart attack or overdosing. She states she is aware of this as her friends have overdose. Discussed typically that she cannot take her beta blocker along with cocaine and of note she has not taken any of her blood pressure medications today.  He did ask about pain control advised her to use Tylenol as needed.

## 2017-05-12 NOTE — Progress Notes (Signed)
   Subjective:    Patient ID: Miranda Sampson, female    DOB: 12/23/1950, 66 y.o.   MRN: [id number]  Patient presents for F/U Lab Results  Patient discussed her Lasix. She was seen 4 weeks ago to follow-up her diabetes. She was missing one of her medications there for A1c came back significantly elevated again at 9.3% and had been 7.8% prior. Her Jardiance was restarted.She did not bring her meter with her today or her medications can bear 5 that she has everything.   She is also on chronic pain medication which was decreased months ago down to 1 tablet daily and drug screen was obtained which showed positive cocaine metabolites. She admits to doing cocaine and states that she does it every other week or so. Her last use was this past weekend. States that she is let the wrong friends come back into her life. She is planning to go get help at mental health. She denies any other illicit drug use or alcohol use.  Review Of Systems:  GEN- denies fatigue, fever, weight loss,weakness, recent illness HEENT- denies eye drainage, change in vision, nasal discharge, CVS- denies chest pain, palpitations RESP- denies SOB, cough, wheeze ABD- denies N/V, change in stools, abd pain GU- denies dysuria, hematuria, dribbling, incontinence MSK- denies joint pain, muscle aches, injury Neuro- denies headache, dizziness, syncope, seizure activity       Objective:    BP (!) 146/70   Pulse (!) 110   Temp 98.4 F (36.9 C) (Oral)   Resp 14   Ht 5\' 11"  (1.803 m)   Wt 197 lb (89.4 kg)   SpO2 96%   BMI 27.48 kg/m  GEN- NAD, alert and oriented x3 HEENT- PERRL, EOMI, non injected sclera, pink conjunctiva, MMM, oropharynx clear Neck- Supple, no thyromegaly CVS- tachycardic HR 100, no murmur Psych- anxious appearing, not depressed, no SI RESP-CTAB         Assessment & Plan:      Problem List Items Addressed This Visit    Diabetes mellitus (HCC) - Primary    Advised to take her medications as directed,  avoid all illegal substances. I tried to discuss with her her recent behind going back to cocaine. She just states that her sister died about a year ago and she was some friends come back over when she was feeling sad and they've been bringing her drugs. She knows that she was to get clean. She is planning to go to mental health out in [place]. I offered her other resources for rehabilitation including some inpatient but she declined this. I did give her a handout which did include [facility] and [facility]. He states that she plans to talk to her children today. I discussed with her the interactions of her medications with cocaine the possibility of having massive heart attack or overdosing. She states she is aware of this as her friends have overdose. Discussed typically that she cannot take her beta blocker along with cocaine and of note she has not taken any of her blood pressure medications today.  He did ask about pain control advised her to use Tylenol as needed.      Cocaine abuse      Note: This dictation was prepared with Dragon dictation along with smaller phrase technology. Any transcriptional errors that result from this process are unintentional.

## 2017-05-12 NOTE — Patient Instructions (Addendum)
Take tylenol twice a day for pain Call Mental Health  F/U as previous

## 2017-06-27 ENCOUNTER — Encounter: Payer: Self-pay | Admitting: Family Medicine

## 2017-07-03 ENCOUNTER — Other Ambulatory Visit: Payer: Self-pay | Admitting: Family Medicine

## 2017-08-16 ENCOUNTER — Encounter: Payer: Medicare Other | Admitting: Family Medicine

## 2017-09-27 ENCOUNTER — Other Ambulatory Visit: Payer: Self-pay | Admitting: Family Medicine

## 2017-10-18 ENCOUNTER — Other Ambulatory Visit: Payer: Self-pay | Admitting: Family Medicine

## 2017-11-28 ENCOUNTER — Other Ambulatory Visit: Payer: Self-pay | Admitting: Family Medicine

## 2017-11-28 ENCOUNTER — Telehealth: Payer: Self-pay | Admitting: *Deleted

## 2017-11-28 NOTE — Telephone Encounter (Signed)
Received call from [facility].   Requested refill on Zocor and Hyzaar to be sent to [facility].   [facility] has been [facility] financial reasons.   Medication will not be refilled.

## 2017-12-26 ENCOUNTER — Other Ambulatory Visit: Payer: Self-pay | Admitting: Family Medicine

## 2018-02-02 DIAGNOSIS — E133392 Other specified diabetes mellitus with moderate nonproliferative diabetic retinopathy without macular edema, left eye: Secondary | ICD-10-CM | POA: Diagnosis not present

## 2018-02-02 DIAGNOSIS — Z0101 Encounter for examination of eyes and vision with abnormal findings: Secondary | ICD-10-CM | POA: Diagnosis not present

## 2018-02-07 ENCOUNTER — Other Ambulatory Visit: Payer: Self-pay | Admitting: Family Medicine

## 2018-02-27 ENCOUNTER — Other Ambulatory Visit: Payer: Self-pay | Admitting: Family Medicine

## 2018-03-02 ENCOUNTER — Other Ambulatory Visit: Payer: Self-pay | Admitting: Family Medicine

## 2018-05-23 ENCOUNTER — Other Ambulatory Visit: Payer: Self-pay | Admitting: Family Medicine

## 2021-08-02 ENCOUNTER — Other Ambulatory Visit (HOSPITAL_COMMUNITY): Payer: Self-pay | Admitting: Internal Medicine

## 2021-08-02 DIAGNOSIS — Z1231 Encounter for screening mammogram for malignant neoplasm of breast: Secondary | ICD-10-CM

## 2021-08-09 ENCOUNTER — Ambulatory Visit (HOSPITAL_COMMUNITY): Payer: Medicare Other

## 2022-03-22 ENCOUNTER — Encounter (HOSPITAL_COMMUNITY): Payer: Self-pay | Admitting: *Deleted

## 2022-03-22 ENCOUNTER — Other Ambulatory Visit: Payer: Self-pay

## 2022-03-22 DIAGNOSIS — N200 Calculus of kidney: Secondary | ICD-10-CM | POA: Insufficient documentation

## 2022-03-22 DIAGNOSIS — M542 Cervicalgia: Secondary | ICD-10-CM | POA: Insufficient documentation

## 2022-03-22 DIAGNOSIS — I10 Essential (primary) hypertension: Secondary | ICD-10-CM | POA: Diagnosis not present

## 2022-03-22 DIAGNOSIS — E119 Type 2 diabetes mellitus without complications: Secondary | ICD-10-CM | POA: Diagnosis not present

## 2022-03-22 DIAGNOSIS — J8410 Pulmonary fibrosis, unspecified: Secondary | ICD-10-CM | POA: Insufficient documentation

## 2022-03-22 DIAGNOSIS — W01198A Fall on same level from slipping, tripping and stumbling with subsequent striking against other object, initial encounter: Secondary | ICD-10-CM | POA: Insufficient documentation

## 2022-03-22 DIAGNOSIS — R519 Headache, unspecified: Secondary | ICD-10-CM | POA: Diagnosis present

## 2022-03-22 NOTE — ED Triage Notes (Signed)
Pt fell x 3 days ago and hit the back of her head and right side; family states pt has  been sleeping more than normal ?

## 2022-03-23 ENCOUNTER — Emergency Department (HOSPITAL_COMMUNITY): Payer: Medicare Other

## 2022-03-23 ENCOUNTER — Emergency Department (HOSPITAL_COMMUNITY)
Admission: EM | Admit: 2022-03-23 | Discharge: 2022-03-23 | Disposition: A | Discharge status: Home / Self Care | Payer: Medicare Other | Attending: Emergency Medicine | Admitting: Emergency Medicine

## 2022-03-23 DIAGNOSIS — W19XXXA Unspecified fall, initial encounter: Secondary | ICD-10-CM

## 2022-03-23 LAB — COMPREHENSIVE METABOLIC PANEL
ALT: 8 U/L (ref 0–44)
AST: 15 U/L (ref 15–41)
Albumin: 3.9 g/dL (ref 3.5–5.0)
Alkaline Phosphatase: 92 U/L (ref 38–126)
Anion gap: 9 (ref 5–15)
BUN: 25 mg/dL — ABNORMAL HIGH (ref 8–23)
CO2: 26 mmol/L (ref 22–32)
Calcium: 9.1 mg/dL (ref 8.9–10.3)
Chloride: 99 mmol/L (ref 98–111)
Creatinine, Ser: 0.73 mg/dL (ref 0.44–1.00)
GFR, Estimated: >60 mL/min (ref >60)
Glucose, Bld: 433 mg/dL — ABNORMAL HIGH (ref 70–99)
Potassium: 3.7 mmol/L (ref 3.5–5.1)
Sodium: 134 mmol/L — ABNORMAL LOW (ref 135–145)
Total Bilirubin: 0.5 mg/dL (ref 0.3–1.2)
Total Protein: 9 g/dL — ABNORMAL HIGH (ref 6.5–8.1)

## 2022-03-23 LAB — CBC
HCT: 48.7 % — ABNORMAL HIGH (ref 36.0–46.0)
Hemoglobin: 16.2 g/dL — ABNORMAL HIGH (ref 12.0–15.0)
MCH: 34 pg (ref 26.0–34.0)
MCHC: 33.3 g/dL (ref 30.0–36.0)
MCV: 102.3 fL — ABNORMAL HIGH (ref 80.0–100.0)
Platelets: 282 10*3/uL (ref 150–400)
RBC: 4.76 MIL/uL (ref 3.87–5.11)
RDW: 13 % (ref 11.5–15.5)
WBC: 7.7 10*3/uL (ref 4.0–10.5)
nRBC: 0 % (ref 0.0–0.2)

## 2022-03-23 NOTE — ED Notes (Signed)
Patient transported to CT 

## 2022-03-23 NOTE — ED Notes (Signed)
Lab at bedside

## 2022-03-23 NOTE — Discharge Instructions (Signed)
You were evaluated in the Emergency Department and after careful evaluation, we did not find any emergent condition requiring admission or further testing in the hospital. ? ?Your exam/testing today was overall reassuring.  CT scans did not show any signs of significant injury. ? ?Please return to the Emergency Department if you experience any worsening of your condition.  Thank you for allowing [place] to be a part of your care. ? ?

## 2022-03-23 NOTE — ED Provider Notes (Signed)
?[facility]-EM[facility]ENCY DEPT ?[facility] Emergency Department ?Provider Note ?MRN:  [id number]  ?Arrival date & time: 03/23/22    ? ?Chief Complaint   ?Fall ?  ?History of Present Illness   ?Miranda Sampson is a 71 y.o. year-old female with a history of hypertension, diabetes presenting to the ED with chief complaint of fall. ? ?Patient felt like her blood sugar was low 2 days ago and thinks she might have passed out.  She hit her head, having continued head pain, neck pain, right side pain.  Reportedly more sleepy than normal per family. ? ?Review of Systems  ?A thorough review of systems was obtained and all systems are negative except as noted in the HPI and PMH.  ? ?Patient's Health History   ? ?Past Medical History:  ?Diagnosis Date  ? Allergy   ? Arthritis   ? Depression   ? Diabetes mellitus   ? GERD (gastroesophageal reflux disease)   ? Hyperlipidemia   ? Hypertension   ? Substance abuse (HCC)   ? Cocaine quit 2009  ?  ?Past Surgical History:  ?Procedure Laterality Date  ?  tumor removed from stomach  1996  ? ESOPHAGOGASTRODUODENOSCOPY N/A 03/20/2014  ? Procedure: ESOPHAGOGASTRODUODENOSCOPY (EGD);  Surgeon: Malissa Hippo, MD;  Location: [facility] ENDO SUITE;  Service: Endoscopy;  Laterality: N/A;  140  ? TONSILLECTOMY    ?  ?Family History  ?Problem Relation Age of Onset  ? Heart disease Mother   ? Diabetes Mother   ? Diabetes Father   ? Cancer Sister   ?     leukemia   ? Colon cancer Neg Hx   ? Stomach cancer Neg Hx   ?  ?Social History  ? ?Socioeconomic History  ? Marital status: Widowed  ?  Spouse name: Not on file  ? Number of children: Not on file  ? Years of education: Not on file  ? Highest education level: Not on file  ?Occupational History  ? Not on file  ?Tobacco Use  ? Smoking status: Never  ? Smokeless tobacco: Never  ?Substance and Sexual Activity  ? Alcohol use: Yes  ?  Comment: socially  ? Drug use: No  ?  Comment: former cocaine user (three years ago)  ? Sexual activity: Yes  ?Other Topics Concern  ? Not  on file  ?Social History Narrative  ? Not on file  ? ?Social Determinants of Health  ? ?Financial Resource Strain: Not on file  ?Food Insecurity: Not on file  ?Transportation Needs: Not on file  ?Physical Activity: Not on file  ?Stress: Not on file  ?Social Connections: Not on file  ?Intimate Partner Violence: Not on file  ?  ? ?Physical Exam  ? ?Vitals:  ? 03/23/22 0235 03/23/22 0236  ?BP: (!) 149/86   ?Pulse: 85 85  ?Resp: 18   ?Temp:    ?SpO2: 98% 98%  ?  ?CONSTITUTIONAL: Well-appearing, NAD ?NEURO/PSYCH:  Alert and oriented x 3, normal and symmetric strength and sensation, normal coordination, normal speech ?EYES:  eyes equal and reactive ?ENT/NECK:  no LAD, no JVD ?CARDIO: Regular rate, well-perfused, normal S1 and S2 ?PULM:  CTAB no wheezing or rhonchi ?GI/GU:  non-distended, non-tender ?MSK/SPINE:  No gross deformities, no edema ?SKIN:  no rash, atraumatic ? ? ?*Additional and/or pertinent findings included in MDM below ? ?Diagnostic and Interventional Summary  ? ? EKG Interpretation ? ?Date/Time:  Wednesday March 23 2022 01:47:57 EDT ?Ventricular Rate:  82 ?PR Interval:  149 ?QRS Duration:  97 ?QT Interval:  414 ?QTC Calculation: 484 ?R Axis:   58 ?Text Interpretation: Sinus rhythm Borderline low voltage, extremity leads Baseline wander in lead(s) V1 Confirmed by Kennis Carina [phone]) on 03/23/2022 2:10:21 AM ?  ? ?  ? ?Labs Reviewed  ?CBC - Abnormal; Notable for the following components:  ?    Result Value  ? Hemoglobin 16.2 (*)   ? HCT 48.7 (*)   ? MCV 102.3 (*)   ? All other components within normal limits  ?COMPREHENSIVE METABOLIC PANEL - Abnormal; Notable for the following components:  ? Sodium 134 (*)   ? Glucose, Bld 433 (*)   ? BUN 25 (*)   ? Total Protein 9.0 (*)   ? All other components within normal limits  ?  ?CT HEAD WO CONTRAST ( )  ?Final Result  ?  ?CT CERVICAL SPINE WO CONTRAST  ?Final Result  ?  ?CT CHEST ABDOMEN PELVIS WO CONTRAST  ?Final Result  ?  ?  ?Medications - No data to display   ? ?Procedures  /  Critical Care ?Procedures ? ?ED Course and Medical Decision Making  ?Initial Impression and Ddx ?Possible syncope in the setting of hypoglycemia 2 days ago, trauma, continued pain.  Awaiting labs, CT imaging to exclude significant traumatic injury. ? ?Past medical/surgical history that increases complexity of ED encounter: None ? ?Interpretation of Diagnostics ?I personally reviewed the EKG and my interpretation is as follows: Sinus rhythm ?   ?Labs do not reveal any significant blood count or electrolyte disturbance.  CT imaging is reassuring without significant traumatic injury ? ?Patient Reassessment and Ultimate Disposition/Management ?Patient continues to look and feel well, appropriate for discharge. ? ?Patient management required discussion with the following services or consulting groups:  None ? ?Complexity of Problems Addressed ?Acute illness or injury that poses threat of life of bodily function ? ?Additional Data Reviewed and Analyzed ?Further history obtained from: ?Further history from spouse/family member ? ?Additional Factors Impacting ED Encounter Risk ?None ? ?Elmer Sow. Pilar Plate, MD ?[facility] Emergency Medicine ?[facility] [email or web address] ? ?Final Clinical Impressions(s) / ED Diagnoses  ? ?  ICD-10-CM   ?1. Fall, initial encounter  [email or web address] Skeens   ?  ?  ?ED Discharge Orders   ? ? None  ? ?  ?  ? ?Discharge Instructions Discussed with and Provided to Patient:  ? ? ? ?Discharge Instructions   ? ?  ?You were evaluated in the Emergency Department and after careful evaluation, we did not find any emergent condition requiring admission or further testing in the hospital. ? ?Your exam/testing today was overall reassuring.  CT scans did not show any signs of significant injury. ? ?Please return to the Emergency Department if you experience any worsening of your condition.  Thank you for allowing [place] to be a part of your care. ? ? ? ? ? ?  ?Sabas Sous, MD ?03/23/22  [phone] ? ?

## 2022-11-07 ENCOUNTER — Encounter (HOSPITAL_COMMUNITY): Payer: Self-pay | Admitting: Emergency Medicine

## 2022-11-07 ENCOUNTER — Inpatient Hospital Stay (HOSPITAL_COMMUNITY)
Admission: EM | Admit: 2022-11-07 | Discharge: 2022-11-10 | DRG: 640 | Disposition: A | Discharge status: Home Health | Payer: Medicare Other | Attending: Internal Medicine | Admitting: Internal Medicine

## 2022-11-07 ENCOUNTER — Other Ambulatory Visit: Payer: Self-pay

## 2022-11-07 ENCOUNTER — Emergency Department (HOSPITAL_COMMUNITY): Payer: Medicare Other

## 2022-11-07 DIAGNOSIS — Z7984 Long term (current) use of oral hypoglycemic drugs: Secondary | ICD-10-CM

## 2022-11-07 DIAGNOSIS — K529 Noninfective gastroenteritis and colitis, unspecified: Secondary | ICD-10-CM | POA: Insufficient documentation

## 2022-11-07 DIAGNOSIS — E11649 Type 2 diabetes mellitus with hypoglycemia without coma: Secondary | ICD-10-CM | POA: Diagnosis not present

## 2022-11-07 DIAGNOSIS — E860 Dehydration: Principal | ICD-10-CM | POA: Diagnosis present

## 2022-11-07 DIAGNOSIS — E876 Hypokalemia: Secondary | ICD-10-CM | POA: Diagnosis present

## 2022-11-07 DIAGNOSIS — W19XXXA Unspecified fall, initial encounter: Secondary | ICD-10-CM | POA: Diagnosis present

## 2022-11-07 DIAGNOSIS — R531 Weakness: Secondary | ICD-10-CM | POA: Diagnosis not present

## 2022-11-07 DIAGNOSIS — Z1152 Encounter for screening for COVID-19: Secondary | ICD-10-CM

## 2022-11-07 DIAGNOSIS — N390 Urinary tract infection, site not specified: Secondary | ICD-10-CM | POA: Diagnosis present

## 2022-11-07 DIAGNOSIS — E1165 Type 2 diabetes mellitus with hyperglycemia: Secondary | ICD-10-CM | POA: Diagnosis present

## 2022-11-07 DIAGNOSIS — Z7982 Long term (current) use of aspirin: Secondary | ICD-10-CM

## 2022-11-07 DIAGNOSIS — I10 Essential (primary) hypertension: Secondary | ICD-10-CM | POA: Diagnosis present

## 2022-11-07 DIAGNOSIS — F32A Depression, unspecified: Secondary | ICD-10-CM | POA: Diagnosis present

## 2022-11-07 DIAGNOSIS — I959 Hypotension, unspecified: Secondary | ICD-10-CM | POA: Diagnosis present

## 2022-11-07 DIAGNOSIS — K219 Gastro-esophageal reflux disease without esophagitis: Secondary | ICD-10-CM | POA: Diagnosis present

## 2022-11-07 DIAGNOSIS — Z8249 Family history of ischemic heart disease and other diseases of the circulatory system: Secondary | ICD-10-CM

## 2022-11-07 DIAGNOSIS — E119 Type 2 diabetes mellitus without complications: Secondary | ICD-10-CM

## 2022-11-07 DIAGNOSIS — E162 Hypoglycemia, unspecified: Secondary | ICD-10-CM

## 2022-11-07 DIAGNOSIS — J189 Pneumonia, unspecified organism: Secondary | ICD-10-CM | POA: Insufficient documentation

## 2022-11-07 DIAGNOSIS — Z794 Long term (current) use of insulin: Secondary | ICD-10-CM

## 2022-11-07 DIAGNOSIS — E785 Hyperlipidemia, unspecified: Secondary | ICD-10-CM | POA: Diagnosis present

## 2022-11-07 DIAGNOSIS — M1990 Unspecified osteoarthritis, unspecified site: Secondary | ICD-10-CM | POA: Diagnosis present

## 2022-11-07 DIAGNOSIS — Z79899 Other long term (current) drug therapy: Secondary | ICD-10-CM

## 2022-11-07 DIAGNOSIS — Z833 Family history of diabetes mellitus: Secondary | ICD-10-CM

## 2022-11-07 LAB — HEPATIC FUNCTION PANEL
ALT: 12 U/L (ref 0–44)
AST: 15 U/L (ref 15–41)
Albumin: 3.2 g/dL — ABNORMAL LOW (ref 3.5–5.0)
Alkaline Phosphatase: 77 U/L (ref 38–126)
Bilirubin, Direct: 0.3 mg/dL — ABNORMAL HIGH (ref 0.0–0.2)
Indirect Bilirubin: 1.5 mg/dL — ABNORMAL HIGH (ref 0.3–0.9)
Total Bilirubin: 1.8 mg/dL — ABNORMAL HIGH (ref 0.3–1.2)
Total Protein: 7.5 g/dL (ref 6.5–8.1)

## 2022-11-07 LAB — BASIC METABOLIC PANEL
Anion gap: 11 (ref 5–15)
BUN: 46 mg/dL — ABNORMAL HIGH (ref 8–23)
CO2: 28 mmol/L (ref 22–32)
Calcium: 10.1 mg/dL (ref 8.9–10.3)
Chloride: 92 mmol/L — ABNORMAL LOW (ref 98–111)
Creatinine, Ser: 0.92 mg/dL (ref 0.44–1.00)
GFR, Estimated: >60 mL/min (ref >60)
Glucose, Bld: 387 mg/dL — ABNORMAL HIGH (ref 70–99)
Potassium: 3.6 mmol/L (ref 3.5–5.1)
Sodium: 131 mmol/L — ABNORMAL LOW (ref 135–145)

## 2022-11-07 LAB — RESP PANEL BY RT-PCR (FLU A&B, COVID) ARPGX2
Influenza A by PCR: NEGATIVE
Influenza B by PCR: NEGATIVE
SARS Coronavirus 2 by RT PCR: NEGATIVE

## 2022-11-07 LAB — CBC
HCT: 46.8 % — ABNORMAL HIGH (ref 36.0–46.0)
Hemoglobin: 16.1 g/dL — ABNORMAL HIGH (ref 12.0–15.0)
MCH: 33.3 pg (ref 26.0–34.0)
MCHC: 34.4 g/dL (ref 30.0–36.0)
MCV: 96.9 fL (ref 80.0–100.0)
Platelets: 337 10*3/uL (ref 150–400)
RBC: 4.83 MIL/uL (ref 3.87–5.11)
RDW: 12.4 % (ref 11.5–15.5)
WBC: 13 10*3/uL — ABNORMAL HIGH (ref 4.0–10.5)
nRBC: 0 % (ref 0.0–0.2)

## 2022-11-07 LAB — LIPASE, BLOOD: Lipase: 26 U/L (ref 11–51)

## 2022-11-07 LAB — CBG MONITORING, ED
Glucose-Capillary: 400 mg/dL — ABNORMAL HIGH (ref 70–99)
Glucose-Capillary: 310 mg/dL — ABNORMAL HIGH (ref 70–99)

## 2022-11-07 MED ORDER — INSULIN GLARGINE-YFGN 100 UNIT/ML ~~LOC~~ SOLN
40.0000 [IU] | Freq: Every day | SUBCUTANEOUS | Status: DC
  Administered 2022-11-08: 40 [IU] via SUBCUTANEOUS
  Filled 2022-11-07 (×2): qty 0.4

## 2022-11-07 MED ORDER — IBUPROFEN 400 MG PO TABS
200.0000 mg | ORAL_TABLET | Freq: Two times a day (BID) | ORAL | Status: DC
  Administered 2022-11-08 (×2): 200 mg via ORAL
  Filled 2022-11-07 (×2): qty 1

## 2022-11-07 MED ORDER — PANTOPRAZOLE SODIUM 40 MG PO TBEC
40.0000 mg | DELAYED_RELEASE_TABLET | Freq: Every day | ORAL | Status: DC
  Administered 2022-11-08 – 2022-11-10 (×3): 40 mg via ORAL
  Filled 2022-11-07 (×3): qty 1

## 2022-11-07 MED ORDER — IOHEXOL 300 MG/ML  SOLN
100.0000 mL | Freq: Once | INTRAMUSCULAR | Status: AC | PRN
Start: 2022-11-07 — End: 2022-11-07
  Administered 2022-11-07: 100 mL via INTRAVENOUS

## 2022-11-07 MED ORDER — INSULIN GLARGINE-YFGN 100 UNIT/ML ~~LOC~~ SOLN: 40.0000 [IU] | Freq: Every day | SUBCUTANEOUS | Status: DC

## 2022-11-07 MED ORDER — ONDANSETRON HCL 4 MG/2ML IJ SOLN
4.0000 mg | Freq: Once | INTRAMUSCULAR | Status: AC
Start: 2022-11-07 — End: 2022-11-07
  Administered 2022-11-07: 4 mg via INTRAVENOUS
  Filled 2022-11-07: qty 2

## 2022-11-07 MED ORDER — ASPIRIN 81 MG PO TBEC
81.0000 mg | DELAYED_RELEASE_TABLET | Freq: Every day | ORAL | Status: DC
  Administered 2022-11-08 – 2022-11-10 (×3): 81 mg via ORAL
  Filled 2022-11-07 (×3): qty 1

## 2022-11-07 MED ORDER — GABAPENTIN 100 MG PO CAPS
100.0000 mg | ORAL_CAPSULE | Freq: Every day | ORAL | Status: DC
  Administered 2022-11-08: 100 mg via ORAL
  Filled 2022-11-07: qty 1

## 2022-11-07 MED ORDER — SODIUM CHLORIDE 0.9 % IV BOLUS
1000.0000 mL | Freq: Once | INTRAVENOUS | Status: AC
  Administered 2022-11-07: 1000 mL via INTRAVENOUS

## 2022-11-07 MED ORDER — LINAGLIPTIN 5 MG PO TABS: 5.0000 mg | ORAL_TABLET | Freq: Every day | ORAL | Status: DC

## 2022-11-07 MED ORDER — ATORVASTATIN CALCIUM 10 MG PO TABS
20.0000 mg | ORAL_TABLET | Freq: Every day | ORAL | Status: DC
  Administered 2022-11-08 – 2022-11-10 (×3): 20 mg via ORAL
  Filled 2022-11-07 (×4): qty 2

## 2022-11-07 NOTE — ED Provider Notes (Signed)
[facility] EMERGENCY DEPARTMENT Provider Note   CSN: [id number] Arrival date & time: 11/07/22  1912     History {Add pertinent medical, surgical, social history, OB history to HPI:1} Chief Complaint  Patient presents with   Weakness    Miranda Sampson is a 71 y.o. female presenting from home with generalized weakness.  She is here with her daughter.  The patient has a history of diabetes, takes 40 units of long-acting insulin at night, and has been compliant with it.  She began feeling nauseated, with vomiting and diarrhea on Thursday.  This was at [place].  She has been weak since then.  Not been able to keep down food or fluids and having persistent diarrhea.  No one else has been sick according to her daughter.  HPI     Home Medications Prior to Admission medications   Medication Sig Start Date End Date Taking? Authorizing Provider  aspirin EC 81 MG tablet Take 81 mg by mouth daily.   Yes [provider]  atorvastatin (LIPITOR) 20 MG tablet Take 20 mg by mouth daily. 10/05/22  Yes [provider]  ibuprofen (ADVIL,MOTRIN) 200 MG tablet Take 1 tablet (200 mg total) by mouth 2 (two) times daily. 06/09/16  Yes [place], Velna Hatchet, MD  meclizine (ANTIVERT) 25 MG tablet Take 25 mg by mouth 3 (three) times daily as needed. 05/24/22  Yes [provider]  omeprazole (PRILOSEC) 20 MG capsule Take 20 mg by mouth daily. 05/16/22  Yes [provider]  gabapentin (NEURONTIN) 100 MG capsule Take 100 mg by mouth at bedtime. 10/05/22   [provider]  glucose blood (ACCU-CHEK AVIVA) test strip Use as instructed to monitor FSBS 2x daily. Dx: E11.9 01/09/17   Salley Scarlet, MD  LANTUS SOLOSTAR 100 UNIT/ML Solostar Pen Inject 40 Units into the skin at bedtime. 10/24/22   [provider]  metoprolol tartrate (LOPRESSOR) 25 MG tablet Take 0.5 tablets (12.5 mg total) by mouth 2 (two) times daily. Patient not taking: Reported on 11/07/2022 01/09/17    Salley Scarlet, MD  pantoprazole (PROTONIX) 40 MG tablet TAKE 1 TABLET TWICE DAILY (BEFORE BREAKFAST AND EVENING MEAL) 09/27/17   [place], Velna Hatchet, MD  TRADJENTA 5 MG TABS tablet Take 5 mg by mouth daily. 10/05/22   [provider]      Allergies    Patient has no known allergies.    Review of Systems   Review of Systems  Physical Exam Updated Vital Signs BP 96/71 (BP Location: Left Arm)   Pulse 96   Temp 97.6 F (36.4 C) (Oral)   Resp 16   Ht 5\' 11"  (1.803 m)   SpO2 96%   BMI 18.13 kg/m  Physical Exam Constitutional:      General: She is not in acute distress.    Comments: Thin, frail  HENT:     Head: Normocephalic and atraumatic.  Eyes:     Conjunctiva/sclera: Conjunctivae normal.     Pupils: Pupils are equal, round, and reactive to light.  Cardiovascular:     Rate and Rhythm: Normal rate and regular rhythm.  Pulmonary:     Effort: Pulmonary effort is normal. No respiratory distress.  Abdominal:     General: There is no distension.     Tenderness: There is no abdominal tenderness.  Skin:    General: Skin is warm and dry.  Neurological:     General: No focal deficit present.     Mental Status: She is  alert. Mental status is at baseline.  Psychiatric:        Mood and Affect: Mood normal.        Behavior: Behavior normal.     ED Results / Procedures / Treatments   Labs (all labs ordered are listed, but only abnormal results are displayed) Labs Reviewed  BASIC METABOLIC PANEL - Abnormal; Notable for the following components:      Result Value   Sodium 131 (*)    Chloride 92 (*)    Glucose, Bld 387 (*)    BUN 46 (*)    All other components within normal limits  CBC - Abnormal; Notable for the following components:   WBC 13.0 (*)    Hemoglobin 16.1 (*)    HCT 46.8 (*)    All other components within normal limits  HEPATIC FUNCTION PANEL - Abnormal; Notable for the following components:   Albumin 3.2 (*)    Total Bilirubin 1.8 (*)     Bilirubin, Direct 0.3 (*)    Indirect Bilirubin 1.5 (*)    All other components within normal limits  RESP PANEL BY RT-PCR (FLU A&B, COVID) ARPGX2  LIPASE, BLOOD  URINALYSIS, ROUTINE W REFLEX MICROSCOPIC  CBG MONITORING, ED    EKG None  Radiology No results found.  Procedures Procedures  {Document cardiac monitor, telemetry assessment procedure when appropriate:1}  Medications Ordered in ED Medications  sodium chloride 0.9 % bolus 1,000 mL (1,000 mLs Intravenous New Bag/Given 11/07/22 2043)  ondansetron (ZOFRAN) injection 4 mg (4 mg Intravenous Given 11/07/22 2045)    ED Course/ Medical Decision Making/ A&P                           Medical Decision Making Amount and/or Complexity of Data Reviewed Labs: ordered.  Risk Prescription drug management.   This patient presents to the ED with concern for nausea, vomiting, diarrhea. This involves an extensive number of treatment options, and is a complaint that carries with it a high risk of complications and morbidity.  The differential diagnosis includes viral gastroenteritis versus colitis versus diverticulitis versus pancreatitis versus other intra-abdominal cause  Co-morbidities that complicate the patient evaluation: History of diabetes at higher risk of diabetic blood sugar complication  Additional history obtained from patient's daughter at bedside  I ordered and personally interpreted labs.  The pertinent results include: Hyperglycemia glucose 327, no anion gap, some mild leukocytosis.  Lipase within normal limits.  I ordered imaging studies including CT abdomen pelvis I independently visualized and interpreted imaging which showed *** I agree with the radiologist interpretation  The patient was maintained on a cardiac monitor.  I ordered medication including IV fluids, IV Zofran, for hydration and nausea  I have reviewed the patients home medicines and have made adjustments as needed  Test Considered: Low  suspicion for mesenteric ischemia clinically.  Low suspicion for sepsis.  After the interventions noted above, I reevaluated the patient and found that they have: {resolved/improved/worsened:23923::"improved"}  Social Determinants of Health:***  Dispostion:  After consideration of the diagnostic results and the patients response to treatment, I feel that the patent would benefit from ***.   {Document critical care time when appropriate:1} {Document review of labs and clinical decision tools ie heart score, Chads2Vasc2 etc:1}  {Document your independent review of radiology images, and any outside records:1} {Document your discussion with family members, caretakers, and with consultants:1} {Document social determinants of health affecting pt's care:1} {Document your decision making why  or why not admission, treatments were needed:1} Final Clinical Impression(s) / ED Diagnoses Final diagnoses:  None    Rx / DC Orders ED Discharge Orders     None

## 2022-11-07 NOTE — ED Triage Notes (Signed)
Pt from home via [facility] C/o generalized weakness since Thursday and c/o hyperglycemia with blood sugar of 500 earlier today. EMS obtained CBG of 402. PT A&O x 4 upon  arrival. [place]

## 2022-11-08 ENCOUNTER — Emergency Department (HOSPITAL_COMMUNITY): Payer: Medicare Other

## 2022-11-08 DIAGNOSIS — J189 Pneumonia, unspecified organism: Secondary | ICD-10-CM | POA: Diagnosis not present

## 2022-11-08 DIAGNOSIS — I10 Essential (primary) hypertension: Secondary | ICD-10-CM

## 2022-11-08 DIAGNOSIS — I959 Hypotension, unspecified: Secondary | ICD-10-CM | POA: Diagnosis present

## 2022-11-08 DIAGNOSIS — E119 Type 2 diabetes mellitus without complications: Secondary | ICD-10-CM

## 2022-11-08 DIAGNOSIS — K529 Noninfective gastroenteritis and colitis, unspecified: Secondary | ICD-10-CM | POA: Diagnosis not present

## 2022-11-08 DIAGNOSIS — Z794 Long term (current) use of insulin: Secondary | ICD-10-CM

## 2022-11-08 DIAGNOSIS — R531 Weakness: Secondary | ICD-10-CM

## 2022-11-08 LAB — CBC WITH DIFFERENTIAL/PLATELET
Abs Immature Granulocytes: 0.06 10*3/uL (ref 0.00–0.07)
Basophils Absolute: 0.1 10*3/uL (ref 0.0–0.1)
Basophils Relative: 1 %
Eosinophils Absolute: 0.1 10*3/uL (ref 0.0–0.5)
Eosinophils Relative: 1 %
HCT: 45.1 % (ref 36.0–46.0)
Hemoglobin: 14.9 g/dL (ref 12.0–15.0)
Immature Granulocytes: 1 %
Lymphocytes Relative: 13 %
Lymphs Abs: 1.3 10*3/uL (ref 0.7–4.0)
MCH: 33.5 pg (ref 26.0–34.0)
MCHC: 33 g/dL (ref 30.0–36.0)
MCV: 101.3 fL — ABNORMAL HIGH (ref 80.0–100.0)
Monocytes Absolute: 1.1 10*3/uL — ABNORMAL HIGH (ref 0.1–1.0)
Monocytes Relative: 10 %
Neutro Abs: 7.8 10*3/uL — ABNORMAL HIGH (ref 1.7–7.7)
Neutrophils Relative %: 74 %
Platelets: 237 10*3/uL (ref 150–400)
RBC: 4.45 MIL/uL (ref 3.87–5.11)
RDW: 12.5 % (ref 11.5–15.5)
WBC: 10.4 10*3/uL (ref 4.0–10.5)
nRBC: 0 % (ref 0.0–0.2)

## 2022-11-08 LAB — BASIC METABOLIC PANEL
Anion gap: 6 (ref 5–15)
BUN: 39 mg/dL — ABNORMAL HIGH (ref 8–23)
CO2: 29 mmol/L (ref 22–32)
Calcium: 8.9 mg/dL (ref 8.9–10.3)
Chloride: 101 mmol/L (ref 98–111)
Creatinine, Ser: 0.72 mg/dL (ref 0.44–1.00)
GFR, Estimated: >60 mL/min (ref >60)
Glucose, Bld: 61 mg/dL — ABNORMAL LOW (ref 70–99)
Potassium: 3.9 mmol/L (ref 3.5–5.1)
Sodium: 136 mmol/L (ref 135–145)

## 2022-11-08 LAB — URINALYSIS, ROUTINE W REFLEX MICROSCOPIC
Bilirubin Urine: NEGATIVE
Glucose, UA: 150 mg/dL — AB
Ketones, ur: NEGATIVE mg/dL
Nitrite: NEGATIVE
Protein, ur: 30 mg/dL — AB
Specific Gravity, Urine: >1.046 — ABNORMAL HIGH (ref 1.005–1.030)
pH: 5 (ref 5.0–8.0)

## 2022-11-08 LAB — CBG MONITORING, ED
Glucose-Capillary: 84 mg/dL (ref 70–99)
Glucose-Capillary: 52 mg/dL — ABNORMAL LOW (ref 70–99)
Glucose-Capillary: 173 mg/dL — ABNORMAL HIGH (ref 70–99)

## 2022-11-08 LAB — GLUCOSE, CAPILLARY: Glucose-Capillary: 129 mg/dL — ABNORMAL HIGH (ref 70–99)

## 2022-11-08 LAB — LACTIC ACID, PLASMA
Lactic Acid, Venous: 1.5 mmol/L (ref 0.5–1.9)
Lactic Acid, Venous: 1.7 mmol/L (ref 0.5–1.9)

## 2022-11-08 MED ORDER — SODIUM CHLORIDE 0.9 % IV SOLN
1.0000 g | Freq: Once | INTRAVENOUS | Status: AC
  Administered 2022-11-08: 1 g via INTRAVENOUS
  Filled 2022-11-08: qty 10

## 2022-11-08 MED ORDER — SODIUM CHLORIDE 0.9 % IV SOLN
2.0000 g | INTRAVENOUS | Status: DC
  Administered 2022-11-09 – 2022-11-10 (×2): 2 g via INTRAVENOUS
  Filled 2022-11-08 (×2): qty 20

## 2022-11-08 MED ORDER — INSULIN ASPART 100 UNIT/ML IJ SOLN: 0.0000 [IU] | Freq: Every day | INTRAMUSCULAR | Status: DC

## 2022-11-08 MED ORDER — DEXTROSE 50 % IV SOLN
INTRAVENOUS | Status: AC
  Administered 2022-11-08: 25 g via INTRAVENOUS
  Filled 2022-11-08: qty 50

## 2022-11-08 MED ORDER — ENOXAPARIN SODIUM 40 MG/0.4ML IJ SOSY
40.0000 mg | PREFILLED_SYRINGE | INTRAMUSCULAR | Status: DC
  Administered 2022-11-08 – 2022-11-09 (×2): 40 mg via SUBCUTANEOUS
  Filled 2022-11-08 (×2): qty 0.4

## 2022-11-08 MED ORDER — ACETAMINOPHEN 650 MG RE SUPP: 650.0000 mg | Freq: Four times a day (QID) | RECTAL | Status: DC | PRN

## 2022-11-08 MED ORDER — SODIUM CHLORIDE 0.9 % IV BOLUS
500.0000 mL | Freq: Once | INTRAVENOUS | Status: AC
  Administered 2022-11-08: 500 mL via INTRAVENOUS

## 2022-11-08 MED ORDER — SODIUM CHLORIDE 0.9 % IV SOLN: INTRAVENOUS | Status: DC

## 2022-11-08 MED ORDER — ONDANSETRON HCL 4 MG PO TABS: 4.0000 mg | ORAL_TABLET | Freq: Four times a day (QID) | ORAL | Status: DC | PRN

## 2022-11-08 MED ORDER — SODIUM CHLORIDE 0.9 % IV SOLN
500.0000 mg | Freq: Once | INTRAVENOUS | Status: AC
  Administered 2022-11-08: 500 mg via INTRAVENOUS
  Filled 2022-11-08: qty 5

## 2022-11-08 MED ORDER — ACETAMINOPHEN 325 MG PO TABS: 650.0000 mg | ORAL_TABLET | Freq: Four times a day (QID) | ORAL | Status: DC | PRN

## 2022-11-08 MED ORDER — SODIUM CHLORIDE 0.9 % IV SOLN
500.0000 mg | INTRAVENOUS | Status: DC
  Administered 2022-11-08 – 2022-11-09 (×2): 500 mg via INTRAVENOUS
  Filled 2022-11-08 (×2): qty 5

## 2022-11-08 MED ORDER — DEXTROSE 50 % IV SOLN: 25.0000 g | Freq: Once | INTRAVENOUS | Status: AC

## 2022-11-08 MED ORDER — SODIUM CHLORIDE 0.9 % IV BOLUS
1000.0000 mL | Freq: Once | INTRAVENOUS | Status: AC
  Administered 2022-11-08: 1000 mL via INTRAVENOUS

## 2022-11-08 MED ORDER — DEXTROSE-NACL 5-0.9 % IV SOLN: INTRAVENOUS | Status: DC

## 2022-11-08 MED ORDER — ONDANSETRON HCL 4 MG/2ML IJ SOLN: 4.0000 mg | Freq: Four times a day (QID) | INTRAMUSCULAR | Status: DC | PRN

## 2022-11-08 MED ORDER — INSULIN ASPART 100 UNIT/ML IJ SOLN
0.0000 [IU] | Freq: Three times a day (TID) | INTRAMUSCULAR | Status: DC
  Administered 2022-11-09: 1 [IU] via SUBCUTANEOUS

## 2022-11-08 NOTE — Progress Notes (Signed)
PT Cancellation Note  Patient Details Name: Miranda Sampson MRN: [id number] DOB: [birth year]   Cancelled Treatment:     Physical therapy held per nurse due to low blood sugar. Will check back when patient is medically stable.   2:03 PM, 11/08/22 Ocie Bob, MPT Physical Therapist with [facility] [phone] office [phone] mobile phone

## 2022-11-08 NOTE — ED Notes (Signed)
Still no urine at this time 

## 2022-11-08 NOTE — Plan of Care (Signed)
  Problem: Acute Rehab PT Goals(only PT should resolve) Goal: Pt Will Go Supine/Side To Sit Outcome: Progressing Flowsheets (Taken 11/08/2022 1601) Pt will go Supine/Side to Sit: Independently Goal: Patient Will Transfer Sit To/From Stand Outcome: Progressing Flowsheets (Taken 11/08/2022 1601) Patient will transfer sit to/from stand: with modified independence Goal: Pt Will Transfer Bed To Chair/Chair To Bed Outcome: Progressing Flowsheets (Taken 11/08/2022 1601) Pt will Transfer Bed to Chair/Chair to Bed: with modified independence Goal: Pt Will Ambulate Outcome: Progressing Flowsheets (Taken 11/08/2022 1601) Pt will Ambulate:  75 feet  with supervision  with least restrictive assistive device   [employer], SPT

## 2022-11-08 NOTE — H&P (Signed)
History and Physical    Miranda Sampson JXB:[id number] DOB: 1951-11-07 DOA: 11/07/2022  PCP: Benetta Spar, MD  Patient coming from: Home  I have personally briefly reviewed patient's old medical records in [facility]  Chief Complaint: Generalized weakness  HPI: Miranda Sampson is a 71 y.o. female with medical history significant of diabetes on insulin, GERD, who presented to the emergency room yesterday evening with a 3 to 4-day history of persistent nausea, vomiting, diarrhea, decreased p.o. intake.  She was generally weak, dizzy on standing and had a fall prior to admission.  On arrival, she was noted to be hyperglycemic, blood sugar 400 on arrival.  She did not have any evidence of DKA, but did appear to be dehydrated.  She was noted to be weak.  CT of the abdomen pelvis indicating gastroenteritis.  Plans were to hold the patient overnight so that she can be seen by physical therapy in the morning to assess if she will be appropriate for discharge home.  The following morning in the emergency room, she was noted to be hypotensive.  Labs checked showed that she was hypoglycemic.  She received bolus of IV fluids.  Chest x-ray indicated possible developing pneumonia.  She was placed on dextrose infusion for hypoglycemia.  She was referred for admission.   Review of Systems: As per HPI otherwise 10 point review of systems negative.    Past Medical History:  Diagnosis Date   Allergy    Arthritis    Depression    Diabetes mellitus    GERD (gastroesophageal reflux disease)    Hyperlipidemia    Hypertension    Substance abuse (HCC)    Cocaine quit 2009    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  1996   ESOPHAGOGASTRODUODENOSCOPY N/A 03/20/2014   Procedure: ESOPHAGOGASTRODUODENOSCOPY (EGD);  Surgeon: Malissa Hippo, MD;  Location: [facility];  Service: Endoscopy;  Laterality: N/A;  140   TONSILLECTOMY      Social History:  reports  that she has never smoked. She has never used smokeless tobacco. She reports current alcohol use. She reports that she does not use drugs.  Not on File  Family History  Problem Relation Age of Onset   Heart disease Mother    Diabetes Mother    Diabetes Father    Cancer Sister        leukemia    Colon cancer Neg Hx    Stomach cancer Neg Hx      Prior to Admission medications   Medication Sig Start Date End Date Taking? Authorizing Provider  aspirin EC 81 MG tablet Take 81 mg by mouth daily.   Yes [provider]  atorvastatin (LIPITOR) 20 MG tablet Take 20 mg by mouth daily. 10/05/22  Yes [provider]  gabapentin (NEURONTIN) 100 MG capsule Take 100 mg by mouth at bedtime. 10/05/22  Yes [provider]  ibuprofen (ADVIL,MOTRIN) 200 MG tablet Take 1 tablet (200 mg total) by mouth 2 (two) times daily. 06/09/16  Yes Emma, Velna Hatchet, MD  LANTUS SOLOSTAR 100 UNIT/ML Solostar Pen Inject 40 Units into the skin at bedtime. 10/24/22  Yes [provider]  meclizine (ANTIVERT) 25 MG tablet Take 25 mg by mouth 3 (three) times daily as needed. 05/24/22  Yes [provider]  TRADJENTA 5 MG TABS tablet Take 5 mg by mouth daily. 10/05/22  Yes [provider]  glucose blood (ACCU-CHEK AVIVA) test  strip Use as instructed to monitor FSBS 2x daily. Dx: E11.9 01/09/17   Salley Scarlet, MD  metoprolol tartrate (LOPRESSOR) 25 MG tablet Take 0.5 tablets (12.5 mg total) by mouth 2 (two) times daily. Patient not taking: Reported on 11/07/2022 01/09/17   Salley Scarlet, MD  omeprazole (PRILOSEC) 20 MG capsule Take 20 mg by mouth daily. Patient not taking: Reported on 11/08/2022 05/16/22   [provider]  pantoprazole (PROTONIX) 40 MG tablet TAKE 1 TABLET TWICE DAILY (BEFORE BREAKFAST AND EVENING MEAL) Patient not taking: Reported on 11/08/2022 09/27/17   Salley Scarlet, MD    Physical Exam: Vitals:   11/08/22 1400 11/08/22 1502 11/08/22  1608 11/08/22 2039  BP: 103/61 132/68  (!) 146/88  Pulse:  84  77  Resp: 20 18 18 20   Temp:  98.5 F (36.9 C)  98.5 F (36.9 C)  TempSrc:  Oral    SpO2:  100%  97%  Height:   5\' 10"  (1.778 m)     Constitutional: NAD, calm, comfortable Eyes: PERRL, lids and conjunctivae normal ENMT: Mucous membranes are moist. Posterior pharynx clear of any exudate or lesions.Normal dentition.  Neck: normal, supple, no masses, no thyromegaly Respiratory: clear to auscultation bilaterally, no wheezing, no crackles. Normal respiratory effort. No accessory muscle use.  Cardiovascular: Regular rate and rhythm, no murmurs / rubs / gallops. No extremity edema. 2+ pedal pulses. No carotid bruits.  Abdomen: no tenderness, no masses palpated. No hepatosplenomegaly. Bowel sounds positive.  Musculoskeletal: no clubbing / cyanosis. No joint deformity upper and lower extremities. Good ROM, no contractures. Normal muscle tone.  Skin: no rashes, lesions, ulcers. No induration Neurologic: CN 2-12 grossly intact. Sensation intact, DTR normal. Strength 5/5 in all 4.  Psychiatric: Normal judgment and insight. Alert and oriented x 3. Normal mood.    Labs on Admission: I have personally reviewed following labs and imaging studies  CBC: Recent Labs  Lab 11/07/22 1955 11/08/22 0909  WBC 13.0* 10.4  NEUTROABS  --  7.8*  HGB 16.1* 14.9  HCT 46.8* 45.1  MCV 96.9 101.3*  PLT 337 237   Basic Metabolic Panel: Recent Labs  Lab 11/07/22 1955 11/08/22 0909  NA 131* 136  K 3.6 3.9  CL 92* 101  CO2 28 29  GLUCOSE 387* 61*  BUN 46* 39*  CREATININE 0.92 0.72  CALCIUM 10.1 8.9   GFR: CrCl cannot be calculated (Unknown ideal weight.). Liver Function Tests: Recent Labs  Lab 11/07/22 1955  AST 15  ALT 12  ALKPHOS 77  BILITOT 1.8*  PROT 7.5  ALBUMIN 3.2*   Recent Labs  Lab 11/07/22 1955  LIPASE 26   No results for input(s): "AMMONIA" in the last 168 hours. Coagulation Profile: No results for input(s):  "INR", "PROTIME" in the last 168 hours. Cardiac Enzymes: No results for input(s): "CKTOTAL", "CKMB", "CKMBINDEX", "TROPONINI" in the last 168 hours. BNP (last 3 results) No results for input(s): "PROBNP" in the last 8760 hours. HbA1C: No results for input(s): "HGBA1C" in the last 72 hours. CBG: Recent Labs  Lab 11/07/22 2207 11/08/22 0744 11/08/22 0925 11/08/22 0951 11/08/22 1937  GLUCAP 310* 84 52* 173* 129*   Lipid Profile: No results for input(s): "CHOL", "HDL", "LDLCALC", "TRIG", "CHOLHDL", "LDLDIRECT" in the last 72 hours. Thyroid Function Tests: No results for input(s): "TSH", "T4TOTAL", "FREET4", "T3FREE", "THYROIDAB" in the last 72 hours. Anemia Panel: No results for input(s): "VITAMINB12", "FOLATE", "FERRITIN", "TIBC", "IRON", "RETICCTPCT" in the last 72 hours. Urine analysis:  Component Value Date/Time   COLORURINE YELLOW 11/08/2022 1120   APPEARANCEUR HAZY (A) 11/08/2022 1120   LABSPEC >1.046 (H) 11/08/2022 1120   PHURINE 5.0 11/08/2022 1120   GLUCOSEU 150 (A) 11/08/2022 1120   HGBUR MODERATE (A) 11/08/2022 1120   BILIRUBINUR NEGATIVE 11/08/2022 1120   KETONESUR NEGATIVE 11/08/2022 1120   PROTEINUR 30 (A) 11/08/2022 1120   UROBILINOGEN 1 01/21/2015 1511   NITRITE NEGATIVE 11/08/2022 1120   LEUKOCYTESUR SMALL (A) 11/08/2022 1120    Radiological Exams on Admission: DG Chest Port 1 View  Result Date: 11/08/2022 CLINICAL DATA:  weakness EXAM: PORTABLE CHEST - 1 VIEW COMPARISON:  05/23/2016 FINDINGS: The heart size and mediastinal contours are within normal limits. There is diffuse pulmonary interstitial prominence which could be seen with atypical infection, edema or interstitial lung disease. There is no focal consolidation. Aorta is calcified. The visualized skeletal structures are unremarkable. IMPRESSION: Nonspecific interstitial prominence consistent with atypical infection, edema or interstitial lung disease. No focal consolidation. Electronically Signed    By: Layla Maw M.D.   On: 11/08/2022 08:07   CT ABDOMEN PELVIS W CONTRAST  Result Date: 11/07/2022 CLINICAL DATA:  Generalized weakness and abdominal pain since Thursday EXAM: CT ABDOMEN AND PELVIS WITH CONTRAST TECHNIQUE: Multidetector CT imaging of the abdomen and pelvis was performed using the standard protocol following bolus administration of intravenous contrast. RADIATION DOSE REDUCTION: This exam was performed according to the departmental dose-optimization program which includes automated exposure control, adjustment of the mA and/or kV according to patient size and/or use of iterative reconstruction technique. CONTRAST:  OMNIPAQUE IOHEXOL 300 MG/ML  SOLN COMPARISON:  CT 03/23/2022 FINDINGS: Lower chest: Chronic interstitial lung disease in the lower lungs. Mitral annular calcification. Normal heart size. No pericardial effusion. Hepatobiliary: Periportal edema. No suspicious focal hepatic lesion. Biliary dilation. Pancreas: Unremarkable. No pancreatic ductal dilatation or surrounding inflammatory changes. Spleen: Normal in size without focal abnormality. Adrenals/Urinary Tract: Normal adrenal glands. Bilateral cortical renal scarring. Nonobstructing stone in the lower pole of the right kidney. No hydronephrosis. Stomach/Bowel: Stomach is unremarkable. Normal caliber large and small bowel. No bowel wall thickening. Bowel anastomosis near the hepatic flexure. Vascular/Lymphatic: Aortic atherosclerosis. No enlarged abdominal or pelvic lymph nodes. Reproductive: Multiple calcified and noncalcified heterogenous uterine fibroids. Other: Trace free fluid in the pelvis. Mild mesenteric edema. Body wall anasarca. Musculoskeletal: Advanced degenerative changes in the thoracolumbar spine. No acute abnormality. IMPRESSION: Mesenteric, periportal, and body wall edema likely related to fluid status. Otherwise no acute abnormality. Interstitial lung disease in the lung bases. Nonobstructing right  nephrolithiasis. Fibroid uterus. Aortic Atherosclerosis (ICD10-I70.0). Electronically Signed   By: Minerva Fester M.D.   On: 11/07/2022 22:22     Assessment/Plan Principal Problem:   Hypotension Active Problems:   Diabetes mellitus (HCC)   Hyperlipidemia   Essential hypertension, benign   Uncontrolled type 2 diabetes mellitus with hypoglycemia, with long-term current use of insulin (HCC)   Gastroenteritis   Generalized weakness   CAP (community acquired pneumonia)    Hypotension -Likely related to volume depletion from GI losses/decreased p.o. intake -Blood pressure has responded to IV fluids -Continue to monitor  Hypoglycemia -Patient is chronically on basal insulin 40 units nightly as well as Tradjenta -She reports continue to take her medications, although her p.o. intake has been poor due to her GI illness -Will continue to hold her insulin for now.  She was placed on dextrose infusion in the emergency room.  She is eating at this time.  Will discontinue dextrose and continue  to monitor on sliding scale  Possible pneumonia -She does endorse having a new cough -Chest x-ray indicates possible atypical pneumonia -Started on IV antibiotics -Checking urinary antigens -She also complains of some dysuria, check UA  Gastroenteritis -Reportedly began on Thanksgiving when she ate "cold soup" before going to Thanksgiving dinner -She has been having diarrhea and vomiting -Diarrhea appears to have resolved.  She still has some nausea, but does not appear to have any vomiting -Continue antiemetics -Started on full liquids and advance to soft diet as tolerated  Diabetes -see above under hypoglycemia regarding medication management and hypoglycemia  Hyperlipidemia -Continue statin  Generalized weakness -Family has concerns about patient returning home -PT following, they are recommending home health -Hopefully they can continue to work with her tomorrow for further improvement  prior to discharge  DVT prophylaxis: Lovenox Code Status: Full code Family Communication: Discussed with family at bedside Disposition Plan: Discharge home once blood pressure and blood sugars have improved Consults called:   Admission status: Observation, telemetry  Erick BlinksJehanzeb Nazariah Cadet MD [facility]   If 7PM-7AM, please contact night-coverage [email or web address]   11/08/2022, 9:11 PM

## 2022-11-08 NOTE — ED Notes (Signed)
Breakfast tray given to patient.

## 2022-11-08 NOTE — ED Provider Notes (Signed)
CRITICAL CARE Performed by: Vanetta Mulders Total critical care time: 40 minutes Critical care time was exclusive of separately billable procedures and treating other patients. Critical care was necessary to treat or prevent imminent or life-threatening deterioration. Critical care was time spent personally by me on the following activities: development of treatment plan with patient and/or surrogate as well as nursing, discussions with consultants, evaluation of patient's response to treatment, examination of patient, obtaining history from patient or surrogate, ordering and performing treatments and interventions, ordering and review of laboratory studies, ordering and review of radiographic studies, pulse oximetry and re-evaluation of patient's condition.  This morning at around 730 patient's paramedic nurse notified me that her blood pressures have been running low all night.  Patient yesterday was around 120 systolic.  But we were getting systolics in the 60s.  Patient was awake and alert but a little bit somnolent.  Patient was not receiving IV fluids.  Reviewed chart from yesterday plan was evaluation for placement did not meet nursing home placement and they were working on home placement.  But based on these low blood pressures I ordered repeat CBC complete metabolic panel.  Patient already had CT of abdomen yesterday without acute findings and get a chest x-ray.  She has to raise some question about atypical pneumonia.  Patient had blood cultures done lactic acid was normal.  Blood sugar actually at around 9025 went down to 52 so she got an amp of D50 that brought it up to 173 were in need to check that again.  Repeat CBC no leukocytosis hemoglobin good actually it was a basic metabolic panel I did not complete metabolic panel and electrolytes were good and renal function was good.  Sugar on that was 61.  COVID influenza testing was negative.  Patient did have an elevated white blood cell count  yesterday.  Chest x-ray that I did this morning mentioned nonspecific interstitial prominence consistent with atypical infection edema or interstitial lung disease no focal consolidation.  CT scan of the abdomen done yesterday had no acute findings.  Patient responded to the fluids.  Based on the chest x-ray we will start Rocephin and Zithromax in case there is pneumonia.  Patient not in any respiratory distress.  Blood pressures have slowly improved most recently systolic and 95 now systolic of 106.  But patient also had hypoglycemia.  Patient does not appear to be eating very well we will switch maintenance fluids to D5 half-normal saline.  Will contact hospitalist for admission.  Certainly not stable for discharge home even with home health aides.   Vanetta Mulders, MD 11/08/22 1110

## 2022-11-08 NOTE — ED Notes (Signed)
Lunch tray given to patient

## 2022-11-08 NOTE — Evaluation (Signed)
Physical Therapy Evaluation Patient Details Name: Miranda Sampson MRN: [id number] DOB: 1951-05-25 Today's Date: 11/08/2022  History of Present Illness  Miranda Sampson is a 71 y.o. female presenting from home with generalized weakness.  She is here with her daughter.  The patient has a history of diabetes, takes 40 units of long-acting insulin at night, and has been compliant with it.  She began feeling nauseated, with vomiting and diarrhea on Thursday.  This was at Thanksgiving.  She has been weak since then.  Not been able to keep down food or fluids and having persistent diarrhea.  No one else has been sick according to her daughter.   Clinical Impression  Patient able to sit up at bedside without assist and ambulate in hallway with min guard/RW and no loss of balance, limited mostly due to generalized weakness and fatigue. Patient tolerated sitting up in chair after therapy. Orthostatics: supine: 146/73, sitting: 130/74, standing: 121/80. Patient will benefit from continued skilled physical therapy in hospital and recommended venue below to increase strength, balance, endurance for safe ADLs and gait.     Recommendations for follow up therapy are one component of a multi-disciplinary discharge planning process, led by the attending physician.  Recommendations may be updated based on patient status, additional functional criteria and insurance authorization.  Follow Up Recommendations Home health PT      Assistance Recommended at Discharge Set up Supervision/Assistance  Patient can return home with the following  A little help with bathing/dressing/bathroom;Assistance with cooking/housework;Help with stairs or ramp for entrance;A little help with walking and/or transfers    Equipment Recommendations None recommended by PT  Recommendations for Other Services       Functional Status Assessment Patient has had a recent decline in their functional status and demonstrates the ability to make  significant improvements in function in a reasonable and predictable amount of time.     Precautions / Restrictions        Mobility  Bed Mobility Overal bed mobility: Modified Independent             General bed mobility comments: increased time, slightly labored movement    Transfers Overall transfer level: Modified independent Equipment used: Rolling walker (2 wheels) Transfers: Sit to/from Stand, Bed to chair/wheelchair/BSC Sit to Stand: Supervision   Step pivot transfers: Supervision       General transfer comment: patient able to stand and transfer to chair with RW/supervision    Ambulation/Gait Ambulation/Gait assistance: Supervision, Min guard Gait Distance (Feet): 35 Feet Assistive device: Rolling walker (2 wheels) Gait Pattern/deviations: Decreased step length - right, Decreased step length - left, Decreased stride length, Trunk flexed Gait velocity: slow     General Gait Details: patient able to ambulate in hallway with RW and min guard/supervision with no loss of balance, limited mostly due to generalized weakness and fatigue  Stairs            Wheelchair Mobility    Modified Rankin (Stroke Patients Only)       Balance Overall balance assessment: Needs assistance Sitting-balance support: No upper extremity supported, Feet supported Sitting balance-Leahy Scale: Good Sitting balance - Comments: seated EOB   Standing balance support: Bilateral upper extremity supported, During functional activity, Reliant on assistive device for balance Standing balance-Leahy Scale: Good Standing balance comment: fair/good with RW                             Pertinent Vitals/Pain  Pain Assessment Pain Assessment: No/denies pain    Home Living Family/patient expects to be discharged to:: Private residence Living Arrangements: Alone Available Help at Discharge: Family;Available PRN/intermittently Type of Home: Apartment Home Access: Stairs  to enter Entrance Stairs-Rails: None Entrance Stairs-Number of Steps: 2   Home Layout: One level Home Equipment: Cane - single point      Prior Function Prior Level of Function : Independent/Modified Independent             Mobility Comments: Household ambulator with SPC, does not drives, daughter grocery shops ADLs Comments: Independent     Hand Dominance   Dominant Hand: Right    Extremity/Trunk Assessment   Upper Extremity Assessment Upper Extremity Assessment: Generalized weakness    Lower Extremity Assessment Lower Extremity Assessment: Generalized weakness    Cervical / Trunk Assessment Cervical / Trunk Assessment: Kyphotic  Communication   Communication: No difficulties  Cognition Arousal/Alertness: Awake/alert Behavior During Therapy: WFL for tasks assessed/performed Overall Cognitive Status: Within Functional Limits for tasks assessed                                          General Comments      Exercises     Assessment/Plan    PT Assessment Patient needs continued PT services  PT Problem List Decreased strength;Decreased activity tolerance;Decreased balance;Decreased mobility       PT Treatment Interventions DME instruction;Balance training;Gait training;Stair training;Functional mobility training;Patient/family education;Therapeutic activities;Therapeutic exercise    PT Goals (Current goals can be found in the Care Plan section)  Acute Rehab PT Goals Patient Stated Goal: return home PT Goal Formulation: With patient Time For Goal Achievement: 11/15/22 Potential to Achieve Goals: Good    Frequency Min 3X/week     Co-evaluation               AM-PAC PT "6 Clicks" Mobility  Outcome Measure Help needed turning from your back to your side while in a flat bed without using bedrails?: None Help needed moving from lying on your back to sitting on the side of a flat bed without using bedrails?: None Help needed moving  to and from a bed to a chair (including a wheelchair)?: A Little Help needed standing up from a chair using your arms (e.g., wheelchair or bedside chair)?: A Little Help needed to walk in hospital room?: A Little Help needed climbing 3-5 steps with a railing? : A Lot 6 Click Score: 19    End of Session   Activity Tolerance: Patient tolerated treatment well;Patient limited by fatigue Patient left: in chair;with call bell/phone within reach Nurse Communication: Mobility status PT Visit Diagnosis: Unsteadiness on feet (R26.81);Other abnormalities of gait and mobility (R26.89);Muscle weakness (generalized) (M62.81)    Time: [id number] PT Time Calculation (min) (ACUTE ONLY): 23 min   Charges:   PT Evaluation $PT Eval Moderate Complexity: 1 Mod PT Treatments $Therapeutic Activity: 23-37 mins        Zigmund Gottron, SPT

## 2022-11-08 NOTE — Inpatient Diabetes Management (Signed)
Inpatient Diabetes Program Recommendations  AACE/ADA: New Consensus Statement on Inpatient Glycemic Control   Target Ranges:  Prepandial:   less than 140 mg/dL      Peak postprandial:   less than 180 mg/dL (1-2 hours)      Critically ill patients:  140 - 180 mg/dL    Latest Reference Range & Units 11/07/22 19:44 11/07/22 22:07 11/08/22 07:44  Glucose-Capillary 70 - 99 mg/dL 294 (H) 765 (H) 84   Review of Glycemic Control  Diabetes history: DM2 Outpatient Diabetes medications: Lantus 40 units QHS, Tradjenta 5 mg daily Current orders for Inpatient glycemic control: Semglee 40 units QHS, Tradjenta 5 mg daily  Inpatient Diabetes Program Recommendations:    Insulin: Please consider decreasing Semglee to 30 units QHS and ordering Novolog 0-9 units TID with meals.  Thanks, Orlando Penner, RN, MSN, CDCES Diabetes Coordinator Inpatient Diabetes Program [phone] (Team Pager from 8am to 5pm)

## 2022-11-08 NOTE — Progress Notes (Addendum)
Transition of Care [facility]) - Emergency Department Mini Assessment   Patient Details  Name: Miranda Sampson MRN: [id number] Date of Birth: 1951-07-17  Transition of Care [facility]) CM/SW Contact:    Miranda Heritage, RN Phone Number: 11/08/2022, 9:22 AM   Clinical Narrative: CM spoke with patient about discharge planning.  Have reviewed chart.  Patient report she has been weak but would ideally like to return home.  CM offered to arrange HHPT/OT services to assist patient with getting stronger post discharge.  CM did explain the medicare three night rule and lack of waiver options, therefore SNF is not an option at this time.  Patient report understanding and agreement to return home with [facility].  Referral given to Miranda Sampson with [facility] and accepted for HHPT/OT/RN.    ED Mini Assessment: What brought you to the Emergency Department? : weakness  Barriers to Discharge: No Barriers Identified     Means of departure: Car  Interventions which prevented an admission or readmission: Home Health Consult or Services    Patient Contact and Communications     Spoke with: patient- Miranda Sampson Contact Date: 11/08/22,   Contact time: [phone]   Call outcome: patient would like to return home with [facility]  Patient states their goals for this hospitalization and ongoing recovery are:: to get help CMS Medicare.gov Compare Post Acute Care list provided to:: Patient Choice offered to / list presented to : Patient  Admission diagnosis:  weakness Patient Active Problem List   Diagnosis Date Noted   Cocaine abuse (HCC) 05/12/2017   Chronic pain disorder 04/11/2017   SVT (supraventricular tachycardia) 05/27/2016   Peripheral edema 05/27/2016   Dry eye 09/15/2014   Nail abnormality 09/15/2014   Melena 03/18/2014   Heme positive stool 03/18/2014   Essential hypertension, benign 01/22/2014   Mood disorder (HCC) 09/16/2011   Diabetes mellitus (HCC) 08/16/2011   Hyperlipidemia 08/16/2011   Osteoarthrosis  involving lower leg 11/02/2009   PCP:  Miranda Spar, MD Pharmacy:   [facility] - [place], [place] - [address] [place] [address] Phone: [phone] Fax: [phone]

## 2022-11-08 NOTE — Progress Notes (Signed)
Assisted patient back to bed from chair and able to stand with walker and pivot to chair with just standby.

## 2022-11-08 NOTE — ED Notes (Signed)
No urine at this time 

## 2022-11-09 DIAGNOSIS — Z79899 Other long term (current) drug therapy: Secondary | ICD-10-CM | POA: Diagnosis not present

## 2022-11-09 DIAGNOSIS — Z1152 Encounter for screening for COVID-19: Secondary | ICD-10-CM | POA: Diagnosis not present

## 2022-11-09 DIAGNOSIS — E876 Hypokalemia: Secondary | ICD-10-CM | POA: Diagnosis present

## 2022-11-09 DIAGNOSIS — E860 Dehydration: Secondary | ICD-10-CM | POA: Diagnosis present

## 2022-11-09 DIAGNOSIS — N390 Urinary tract infection, site not specified: Secondary | ICD-10-CM | POA: Diagnosis present

## 2022-11-09 DIAGNOSIS — W19XXXA Unspecified fall, initial encounter: Secondary | ICD-10-CM | POA: Diagnosis present

## 2022-11-09 DIAGNOSIS — F32A Depression, unspecified: Secondary | ICD-10-CM | POA: Diagnosis present

## 2022-11-09 DIAGNOSIS — E11649 Type 2 diabetes mellitus with hypoglycemia without coma: Secondary | ICD-10-CM | POA: Diagnosis not present

## 2022-11-09 DIAGNOSIS — Z7984 Long term (current) use of oral hypoglycemic drugs: Secondary | ICD-10-CM | POA: Diagnosis not present

## 2022-11-09 DIAGNOSIS — K219 Gastro-esophageal reflux disease without esophagitis: Secondary | ICD-10-CM | POA: Diagnosis present

## 2022-11-09 DIAGNOSIS — I959 Hypotension, unspecified: Secondary | ICD-10-CM | POA: Diagnosis present

## 2022-11-09 DIAGNOSIS — E1165 Type 2 diabetes mellitus with hyperglycemia: Secondary | ICD-10-CM | POA: Diagnosis present

## 2022-11-09 DIAGNOSIS — Z833 Family history of diabetes mellitus: Secondary | ICD-10-CM | POA: Diagnosis not present

## 2022-11-09 DIAGNOSIS — E785 Hyperlipidemia, unspecified: Secondary | ICD-10-CM | POA: Diagnosis present

## 2022-11-09 DIAGNOSIS — M1990 Unspecified osteoarthritis, unspecified site: Secondary | ICD-10-CM | POA: Diagnosis present

## 2022-11-09 DIAGNOSIS — K529 Noninfective gastroenteritis and colitis, unspecified: Secondary | ICD-10-CM | POA: Diagnosis present

## 2022-11-09 DIAGNOSIS — Z7982 Long term (current) use of aspirin: Secondary | ICD-10-CM | POA: Diagnosis not present

## 2022-11-09 DIAGNOSIS — Z8249 Family history of ischemic heart disease and other diseases of the circulatory system: Secondary | ICD-10-CM | POA: Diagnosis not present

## 2022-11-09 DIAGNOSIS — I10 Essential (primary) hypertension: Secondary | ICD-10-CM | POA: Diagnosis present

## 2022-11-09 DIAGNOSIS — Z794 Long term (current) use of insulin: Secondary | ICD-10-CM | POA: Diagnosis not present

## 2022-11-09 DIAGNOSIS — R531 Weakness: Secondary | ICD-10-CM | POA: Diagnosis present

## 2022-11-09 DIAGNOSIS — J189 Pneumonia, unspecified organism: Secondary | ICD-10-CM | POA: Diagnosis present

## 2022-11-09 LAB — CBC
HCT: 45.2 % (ref 36.0–46.0)
Hemoglobin: 14.8 g/dL (ref 12.0–15.0)
MCH: 33 pg (ref 26.0–34.0)
MCHC: 32.7 g/dL (ref 30.0–36.0)
MCV: 100.7 fL — ABNORMAL HIGH (ref 80.0–100.0)
Platelets: 284 10*3/uL (ref 150–400)
RBC: 4.49 MIL/uL (ref 3.87–5.11)
RDW: 12.5 % (ref 11.5–15.5)
WBC: 11.2 10*3/uL — ABNORMAL HIGH (ref 4.0–10.5)
nRBC: 0 % (ref 0.0–0.2)

## 2022-11-09 LAB — GLUCOSE, CAPILLARY
Glucose-Capillary: 65 mg/dL — ABNORMAL LOW (ref 70–99)
Glucose-Capillary: 106 mg/dL — ABNORMAL HIGH (ref 70–99)
Glucose-Capillary: 100 mg/dL — ABNORMAL HIGH (ref 70–99)
Glucose-Capillary: 144 mg/dL — ABNORMAL HIGH (ref 70–99)
Glucose-Capillary: 104 mg/dL — ABNORMAL HIGH (ref 70–99)
Glucose-Capillary: 64 mg/dL — ABNORMAL LOW (ref 70–99)

## 2022-11-09 LAB — COMPREHENSIVE METABOLIC PANEL WITH GFR
ALT: 11 U/L (ref 0–44)
AST: 15 U/L (ref 15–41)
Albumin: 2.5 g/dL — ABNORMAL LOW (ref 3.5–5.0)
Alkaline Phosphatase: 54 U/L (ref 38–126)
Anion gap: 6 (ref 5–15)
BUN: 20 mg/dL (ref 8–23)
CO2: 25 mmol/L (ref 22–32)
Calcium: 8.3 mg/dL — ABNORMAL LOW (ref 8.9–10.3)
Chloride: 108 mmol/L (ref 98–111)
Creatinine, Ser: 0.45 mg/dL (ref 0.44–1.00)
GFR, Estimated: >60 mL/min
Glucose, Bld: 68 mg/dL — ABNORMAL LOW (ref 70–99)
Potassium: 3.1 mmol/L — ABNORMAL LOW (ref 3.5–5.1)
Sodium: 139 mmol/L (ref 135–145)
Total Bilirubin: 1 mg/dL (ref 0.3–1.2)
Total Protein: 6.5 g/dL (ref 6.5–8.1)

## 2022-11-09 LAB — STREP PNEUMONIAE URINARY ANTIGEN: Strep Pneumo Urinary Antigen: NEGATIVE

## 2022-11-09 MED ORDER — POTASSIUM CHLORIDE CRYS ER 20 MEQ PO TBCR
40.0000 meq | EXTENDED_RELEASE_TABLET | Freq: Two times a day (BID) | ORAL | Status: AC
  Administered 2022-11-09 (×2): 40 meq via ORAL
  Filled 2022-11-09 (×2): qty 2

## 2022-11-09 MED ORDER — GLUCOSE 40 % PO GEL
ORAL | Status: AC
  Filled 2022-11-09: qty 1.21

## 2022-11-09 MED ORDER — SODIUM CHLORIDE 0.9 % IV BOLUS
500.0000 mL | Freq: Once | INTRAVENOUS | Status: AC
  Administered 2022-11-09: 500 mL via INTRAVENOUS

## 2022-11-09 NOTE — Progress Notes (Signed)
   11/09/22 0357  Assess: MEWS Score  Temp 98.8 F (37.1 C)  BP 94/70  MAP (mmHg) 78  Pulse Rate (!) 164  ECG Heart Rate (!) 163  Resp 18  SpO2 98 %  O2 Device Room Air  Assess: MEWS Score  MEWS Temp 0  MEWS Systolic 1  MEWS Pulse 3  MEWS RR 0  MEWS LOC 0  MEWS Score 4  MEWS Score Color Red  Assess: if the MEWS score is Yellow or Red  Were vital signs taken at a resting state? Yes  Focused Assessment Change from prior assessment (see assessment flowsheet)  Does the patient meet 2 or more of the SIRS criteria? No  MEWS guidelines implemented *See Row Information* Yes  Treat  MEWS Interventions Other (Comment)  Pain Scale 0-10  Pain Score 0  Notify: Charge Nurse/RN  Name of Charge Nurse/RN Notified Edgar Frisk, RN  Date Charge Nurse/RN Notified 11/09/22  Time Charge Nurse/RN Notified 0407  Provider Notification  Provider Name/Title O. Thomes Dinning, DO  Date Provider Notified 11/09/22  Time Provider Notified 0400  Method of Notification Page  Notification Reason Change in status  Provider response See new orders  Date of Provider Response 11/09/22  Time of Provider Response 0408  Assess: SIRS CRITERIA  SIRS Temperature  0  SIRS Pulse 1  SIRS Respirations  0  SIRS WBC 1  SIRS Score Sum  2

## 2022-11-09 NOTE — Progress Notes (Signed)
PROGRESS NOTE    Miranda Sampson  ZHG:[id number] DOB: [birth year] DOA: 11/07/2022 PCP: Benetta Spar, MD   Brief Narrative:    Miranda Sampson is a 71 y.o. female with medical history significant of diabetes on insulin, GERD, who presented to the emergency room yesterday evening with a 3 to 4-day history of persistent nausea, vomiting, diarrhea, decreased p.o. intake.  She was generally weak, dizzy on standing and had a fall prior to admission.  She was admitted for symptoms of hypotension and hypoglycemia as well as possible pneumonia in the setting of acute gastroenteritis.  Assessment & Plan:   Principal Problem:   Hypotension Active Problems:   Diabetes mellitus (HCC)   Hyperlipidemia   Essential hypertension, benign   Uncontrolled type 2 diabetes mellitus with hypoglycemia, with long-term current use of insulin (HCC)   Gastroenteritis   Generalized weakness   CAP (community acquired pneumonia)  Assessment and Plan:   Hypotension -Likely related to volume depletion from GI losses/decreased p.o. intake -Blood pressure has responded to IV fluids -Continue to monitor   Hypoglycemia -Patient is chronically on basal insulin 40 units nightly as well as Tradjenta -She reports continue to take her medications, although her p.o. intake has been poor due to her GI illness -Will continue to hold her insulin for now.  She was placed on dextrose infusion in the emergency room.  She is eating at this time.  Will discontinue dextrose and continue to monitor on sliding scale -Advance to soft diet and continue to monitor   Possible pneumonia -She does endorse having a new cough -Chest x-ray indicates possible atypical pneumonia -Started on IV antibiotics -Checking urinary antigens  UTI -Urine analysis concerning for UTI with cultures pending  Hypokalemia -Replete and reevaluate in a.m.   Gastroenteritis -Reportedly began on Thanksgiving when she ate "cold soup" before going to  Thanksgiving dinner -She has been having diarrhea and vomiting -Diarrhea appears to have resolved.  She still has some nausea, but does not appear to have any vomiting -Continue antiemetics -Advance to soft diet   Diabetes -see above under hypoglycemia regarding medication management and hypoglycemia   Hyperlipidemia -Continue statin   Generalized weakness -Family has concerns about patient returning home -PT following, they are recommending home health -Hopefully they can continue to work with her tomorrow for further improvement prior to discharge    DVT prophylaxis:Lovenox Code Status: Full Family Communication: None at bedside Disposition Plan:  Status is: Observation The patient will require care spanning > 2 midnights and should be moved to inpatient because: Need for ongoing IV fluid.   Consultants:  None  Procedures:  None  Antimicrobials:  Anti-infectives (From admission, onward)    Start     Dose/Rate Route Frequency Ordered Stop   11/09/22 1100  cefTRIAXone (ROCEPHIN) 2 g in sodium chloride 0.9 % 100 mL IVPB        2 g 200 mL/hr over 30 Minutes Intravenous Every 24 hours 11/08/22 1854 11/14/22 1059   11/08/22 2000  cefTRIAXone (ROCEPHIN) 1 g in sodium chloride 0.9 % 100 mL IVPB        1 g 200 mL/hr over 30 Minutes Intravenous  Once 11/08/22 1904 11/08/22 2012   11/08/22 1945  azithromycin (ZITHROMAX) 500 mg in sodium chloride 0.9 % 250 mL IVPB        500 mg 250 mL/hr over 60 Minutes Intravenous Every 24 hours 11/08/22 1854 11/13/22 1944   11/08/22 1115  cefTRIAXone (ROCEPHIN) 1 g in sodium  chloride 0.9 % 100 mL IVPB        1 g 200 mL/hr over 30 Minutes Intravenous  Once 11/08/22 1105 11/08/22 1201   11/08/22 1115  azithromycin (ZITHROMAX) 500 mg in sodium chloride 0.9 % 250 mL IVPB        500 mg 250 mL/hr over 60 Minutes Intravenous  Once 11/08/22 1105 11/08/22 1300      Subjective: Patient seen and evaluated today with no new acute complaints or  concerns. No acute concerns or events noted overnight.  She is still not eating very well and continues to have feelings of weakness.  Objective: Vitals:   11/09/22 0357 11/09/22 0500 11/09/22 0600 11/09/22 0729  BP: 94/70 124/74  103/67  Pulse: (!) 164 (!) 135 79 84  Resp: 18 17    Temp: 98.8 F (37.1 C) 98.7 F (37.1 C)  98.1 F (36.7 C)  TempSrc:  Oral  Oral  SpO2: 98% 96%  98%  Height:        Intake/Output Summary (Last 24 hours) at 11/09/2022 1447 Last data filed at 11/09/2022 0509 Gross per 24 hour  Intake 1730.44 ml  Output --  Net 1730.44 ml   There were no vitals filed for this visit.  Examination:  General exam: Appears calm and comfortable  Respiratory system: Clear to auscultation. Respiratory effort normal. Cardiovascular system: S1 & S2 heard, RRR.  Gastrointestinal system: Abdomen is soft Central nervous system: Alert and awake Extremities: No edema Skin: No significant lesions noted Psychiatry: Flat affect.    Data Reviewed: I have personally reviewed following labs and imaging studies  CBC: Recent Labs  Lab 11/07/22 1955 11/08/22 0909 11/09/22 0543  WBC 13.0* 10.4 11.2*  NEUTROABS  --  7.8*  --   HGB 16.1* 14.9 14.8  HCT 46.8* 45.1 45.2  MCV 96.9 101.3* 100.7*  PLT 337 237 284   Basic Metabolic Panel: Recent Labs  Lab 11/07/22 1955 11/08/22 0909 11/09/22 0543  NA 131* 136 139  K 3.6 3.9 3.1*  CL 92* 101 108  CO2 28 29 25   GLUCOSE 387* 61* 68*  BUN 46* 39* 20  CREATININE 0.92 0.72 0.45  CALCIUM 10.1 8.9 8.3*   GFR: CrCl cannot be calculated (Unknown ideal weight.). Liver Function Tests: Recent Labs  Lab 11/07/22 1955 11/09/22 0543  AST 15 15  ALT 12 11  ALKPHOS 77 54  BILITOT 1.8* 1.0  PROT 7.5 6.5  ALBUMIN 3.2* 2.5*   Recent Labs  Lab 11/07/22 1955  LIPASE 26   No results for input(s): "AMMONIA" in the last 168 hours. Coagulation Profile: No results for input(s): "INR", "PROTIME" in the last 168 hours. Cardiac  Enzymes: No results for input(s): "CKTOTAL", "CKMB", "CKMBINDEX", "TROPONINI" in the last 168 hours. BNP (last 3 results) No results for input(s): "PROBNP" in the last 8760 hours. HbA1C: No results for input(s): "HGBA1C" in the last 72 hours. CBG: Recent Labs  Lab 11/08/22 0951 11/08/22 1937 11/09/22 0720 11/09/22 0825 11/09/22 1152  GLUCAP 173* 129* 65* 106* 100*   Lipid Profile: No results for input(s): "CHOL", "HDL", "LDLCALC", "TRIG", "CHOLHDL", "LDLDIRECT" in the last 72 hours. Thyroid Function Tests: No results for input(s): "TSH", "T4TOTAL", "FREET4", "T3FREE", "THYROIDAB" in the last 72 hours. Anemia Panel: No results for input(s): "VITAMINB12", "FOLATE", "FERRITIN", "TIBC", "IRON", "RETICCTPCT" in the last 72 hours. Sepsis Labs: Recent Labs  Lab 11/08/22 0909 11/08/22 1113  LATICACIDVEN 1.5 1.7    Recent Results (from the past 240 hour(s))  Resp  Panel by RT-PCR (Flu A&B, Covid) Anterior Nasal Swab     Status: None   Collection Time: 11/07/22  8:55 PM   Specimen: Anterior Nasal Swab  Result Value Ref Range Status   SARS Coronavirus 2 by RT PCR NEGATIVE NEGATIVE Final    Comment: (NOTE) SARS-CoV-2 target nucleic acids are NOT DETECTED.  The SARS-CoV-2 RNA is generally detectable in upper respiratory specimens during the acute phase of infection. The lowest concentration of SARS-CoV-2 viral copies this assay can detect is 138 copies/mL. A negative result does not preclude SARS-Cov-2 infection and should not be used as the sole basis for treatment or other patient management decisions. A negative result may occur with  improper specimen collection/handling, submission of specimen other than nasopharyngeal swab, presence of viral mutation(s) within the areas targeted by this assay, and inadequate number of viral copies(<138 copies/mL). A negative result must be combined with clinical observations, patient history, and epidemiological information. The expected  result is Negative.  Fact Sheet for Patients:  EntrepreneurPulse.com.au  Fact Sheet for Healthcare Providers:  IncredibleEmployment.be  This test is no t yet approved or cleared by the [place] FDA and  has been authorized for detection and/or diagnosis of SARS-CoV-2 by FDA under an Emergency Use Authorization (EUA). This EUA will remain  in effect (meaning this test can be used) for the duration of the COVID-19 declaration under Section 564(b)(1) of the Act, 21 U.S.C.section 360bbb-3(b)(1), unless the authorization is terminated  or revoked sooner.       Influenza A by PCR NEGATIVE NEGATIVE Final   Influenza B by PCR NEGATIVE NEGATIVE Final    Comment: (NOTE) The Xpert Xpress SARS-CoV-2/FLU/RSV plus assay is intended as an aid in the diagnosis of influenza from Nasopharyngeal swab specimens and should not be used as a sole basis for treatment. Nasal washings and aspirates are unacceptable for Xpert Xpress SARS-CoV-2/FLU/RSV testing.  Fact Sheet for Patients: EntrepreneurPulse.com.au  Fact Sheet for Healthcare Providers: IncredibleEmployment.be  This test is not yet approved or cleared by the [place] FDA and has been authorized for detection and/or diagnosis of SARS-CoV-2 by FDA under an Emergency Use Authorization (EUA). This EUA will remain in effect (meaning this test can be used) for the duration of the COVID-19 declaration under Section 564(b)(1) of the Act, 21 U.S.C. section 360bbb-3(b)(1), unless the authorization is terminated or revoked.  Performed at [facility], [address] [place], [place] [address]   Culture, blood (Routine X 2) w Reflex to ID Panel     Status: None (Preliminary result)   Collection Time: 11/08/22 10:05 AM   Specimen: BLOOD  Result Value Ref Range Status   Specimen Description BLOOD Lt Wrist  Final   Special Requests   Final    BOTTLES DRAWN AEROBIC AND  ANAEROBIC BLOOD LEFT WRIST Performed at [facility], [address], [place], [place] [address]    Culture PENDING  Incomplete   Report Status PENDING  Incomplete  Culture, blood (Routine X 2) w Reflex to ID Panel     Status: None (Preliminary result)   Collection Time: 11/08/22 10:05 AM   Specimen: BLOOD  Result Value Ref Range Status   Specimen Description BLOOD BLOOD RIGHT HAND AEROBIC BOTTLE ONLY  Final   Special Requests   Final    Blood Culture results may not be optimal due to an inadequate volume of blood received in culture bottles Performed at [facility], [address], [place], [place] [address]    Culture PENDING  Incomplete   Report Status PENDING  Incomplete         Radiology Studies: DG Chest Port 1 View  Result Date: 11/08/2022 CLINICAL DATA:  weakness EXAM: PORTABLE CHEST - 1 VIEW COMPARISON:  05/23/2016 FINDINGS: The heart size and mediastinal contours are within normal limits. There is diffuse pulmonary interstitial prominence which could be seen with atypical infection, edema or interstitial lung disease. There is no focal consolidation. Aorta is calcified. The visualized skeletal structures are unremarkable. IMPRESSION: Nonspecific interstitial prominence consistent with atypical infection, edema or interstitial lung disease. No focal consolidation. Electronically Signed   By: Sammie Bench M.D.   On: 11/08/2022 08:07   CT ABDOMEN PELVIS W CONTRAST  Result Date: 11/07/2022 CLINICAL DATA:  Generalized weakness and abdominal pain since Thursday EXAM: CT ABDOMEN AND PELVIS WITH CONTRAST TECHNIQUE: Multidetector CT imaging of the abdomen and pelvis was performed using the standard protocol following bolus administration of intravenous contrast. RADIATION DOSE REDUCTION: This exam was performed according to the departmental dose-optimization program which includes automated exposure control, adjustment of the mA and/or kV according to patient size and/or use of  iterative reconstruction technique. CONTRAST:  197mL OMNIPAQUE IOHEXOL 300 MG/ML  SOLN COMPARISON:  CT 03/23/2022 FINDINGS: Lower chest: Chronic interstitial lung disease in the lower lungs. Mitral annular calcification. Normal heart size. No pericardial effusion. Hepatobiliary: Periportal edema. No suspicious focal hepatic lesion. Biliary dilation. Pancreas: Unremarkable. No pancreatic ductal dilatation or surrounding inflammatory changes. Spleen: Normal in size without focal abnormality. Adrenals/Urinary Tract: Normal adrenal glands. Bilateral cortical renal scarring. Nonobstructing stone in the lower pole of the right kidney. No hydronephrosis. Stomach/Bowel: Stomach is unremarkable. Normal caliber large and small bowel. No bowel wall thickening. Bowel anastomosis near the hepatic flexure. Vascular/Lymphatic: Aortic atherosclerosis. No enlarged abdominal or pelvic lymph nodes. Reproductive: Multiple calcified and noncalcified heterogenous uterine fibroids. Other: Trace free fluid in the pelvis. Mild mesenteric edema. Body wall anasarca. Musculoskeletal: Advanced degenerative changes in the thoracolumbar spine. No acute abnormality. IMPRESSION: Mesenteric, periportal, and body wall edema likely related to fluid status. Otherwise no acute abnormality. Interstitial lung disease in the lung bases. Nonobstructing right nephrolithiasis. Fibroid uterus. Aortic Atherosclerosis (ICD10-I70.0). Electronically Signed   By: Placido Sou M.D.   On: 11/07/2022 22:22        Scheduled Meds:  aspirin EC  81 mg Oral Daily   atorvastatin  20 mg Oral Daily   enoxaparin (LOVENOX) injection  40 mg Subcutaneous Q24H   insulin aspart  0-5 Units Subcutaneous QHS   insulin aspart  0-9 Units Subcutaneous TID WC   pantoprazole  40 mg Oral Daily   potassium chloride  40 mEq Oral BID   Continuous Infusions:  sodium chloride 100 mL/hr at 11/09/22 1205   azithromycin Stopped (11/08/22 2145)   cefTRIAXone (ROCEPHIN)  IV 2 g  (11/09/22 1207)     LOS: 0 days    Time spent: 35 minutes    Anais Koenen Darleen Crocker, DO [facility]  If 7PM-7AM, please contact night-coverage [email or web address] 11/09/2022, 2:47 PM

## 2022-11-09 NOTE — Care Management Obs Status (Signed)
MEDICARE OBSERVATION STATUS NOTIFICATION   Patient Details  Name: Miranda Sampson MRN: [id number] Date of Birth: [birth year]   Medicare Observation Status Notification Given:  Yes    Corey Harold 11/09/2022, 4:33 PM

## 2022-11-09 NOTE — TOC Initial Note (Signed)
Transition of Care [facility]) - Initial/Assessment Note    Patient Details  Name: Miranda Sampson MRN: [id number] Date of Birth: 09-24-1951  Transition of Care [facility]) CM/SW Contact:    Shade Flood, LCSW Phone Number: 11/09/2022, 11:48 AM  Clinical Narrative:                  Pt admitted observation status. MD anticipating dc home tomorrow. ED CM has already arranged for [facility] with [facility] at dc. See ED TOC note for further assessment information.  TOC will follow.  Expected Discharge Plan: [place] Barriers to Discharge: Continued Medical Work up   Patient Goals and CMS Choice Patient states their goals for this hospitalization and ongoing recovery are:: to get help CMS Medicare.gov Compare Post Acute Care list provided to:: Patient Choice offered to / list presented to : Patient  Expected Discharge Plan and Services Expected Discharge Plan: [place] Arranged: PT   Date [place]: 11/08/22   Representative spoke with at [place]: Marjory Lies  Prior Living Arrangements/Services     Patient language and need for interpreter reviewed:: Yes Do you feel safe going back to the place where you live?: Yes      Need for Family Participation in Patient Care: No (Comment)     Criminal Activity/Legal Involvement Pertinent to Current Situation/Hospitalization: No - Comment as needed  Activities of Daily Living Home Assistive Devices/Equipment: Cane (specify quad or straight), CBG Meter ADL Screening (condition at time of admission) Patient's cognitive ability adequate to safely complete daily activities?: No Is the patient deaf or have difficulty hearing?: No Does the patient have difficulty seeing, even when wearing glasses/contacts?: No Does the patient have difficulty concentrating, remembering, or making decisions?: Yes Patient able to express need for assistance with ADLs?: Yes Does the  patient have difficulty dressing or bathing?: Yes Independently performs ADLs?: No Communication: Independent Is this a change from baseline?: Pre-admission baseline Dressing (OT): Needs assistance Is this a change from baseline?: Pre-admission baseline Grooming: Needs assistance Is this a change from baseline?: Pre-admission baseline Feeding: Independent Bathing: Needs assistance Is this a change from baseline?: Pre-admission baseline Toileting: Needs assistance Is this a change from baseline?: Pre-admission baseline In/Out Bed: Needs assistance Is this a change from baseline?: Pre-admission baseline Walks in Home: Needs assistance Is this a change from baseline?: Pre-admission baseline Does the patient have difficulty walking or climbing stairs?: No Weakness of Legs: Both Weakness of Arms/Hands: None  Permission Sought/Granted Permission sought to share information with : Chartered certified accountant granted to share information with : Yes, Verbal Permission Granted     Permission granted to share info w AGENCY: [place]        Emotional Assessment       Orientation: : Oriented to Self, Oriented to Place, Oriented to  Time, Oriented to Situation Alcohol / Substance Use: Not Applicable Psych Involvement: No (comment)  Admission diagnosis:  Weakness [R53.1] Hypoglycemia [E16.2] Hypotension [I95.9] Hypotension, unspecified hypotension type [I95.9] Patient Active Problem List   Diagnosis Date Noted   Hypotension 11/08/2022   Uncontrolled type 2 diabetes mellitus with hypoglycemia, with long-term current use of insulin ([place]) 11/08/2022   Gastroenteritis 11/08/2022   Generalized weakness 11/08/2022   CAP (community acquired pneumonia) 11/08/2022  Cocaine abuse (HCC) 05/12/2017   Chronic pain disorder 04/11/2017   SVT (supraventricular tachycardia) 05/27/2016   Peripheral edema 05/27/2016   Dry eye 09/15/2014   Nail abnormality 09/15/2014   Melena 03/18/2014    Heme positive stool 03/18/2014   Essential hypertension, benign 01/22/2014   Mood disorder (HCC) 09/16/2011   Diabetes mellitus (HCC) 08/16/2011   Hyperlipidemia 08/16/2011   Osteoarthrosis involving lower leg 11/02/2009   PCP:  Benetta Spar, MD Pharmacy:   [facility] - [place], [place] - [address] [place] [address] Phone: [phone] Fax: [phone]     Social Determinants of Health (SDOH) Interventions    Readmission Risk Interventions     No data to display

## 2022-11-09 NOTE — Progress Notes (Signed)
Patient currently a mews of 4 d/t HR of 164 sustaining and bp 94/70. EKG obtained and put in chart. N.O to give 500 ml bolus. Will reassess after bolus is finished.

## 2022-11-09 NOTE — Progress Notes (Signed)
Physical Therapy Treatment Patient Details Name: Miranda Sampson MRN: [id number] DOB: 1951-09-23 Today's Date: 11/09/2022   History of Present Illness Miranda Sampson is a 71 y.o. female presenting from home with generalized weakness.  She is here with her daughter.  The patient has a history of diabetes, takes 40 units of long-acting insulin at night, and has been compliant with it.  She began feeling nauseated, with vomiting and diarrhea on Thursday.  This was at Thanksgiving.  She has been weak since then.  Not been able to keep down food or fluids and having persistent diarrhea.  No one else has been sick according to her daughter.    PT Comments    Pt with noted improvement of sit to stand with increased practice.  Pt continues to have decreased activity tolerance and fatigues quickly.   Recommendations for follow up therapy are one component of a multi-disciplinary discharge planning process, led by the attending physician.  Recommendations may be updated based on patient status, additional functional criteria and insurance authorization.  Follow Up Recommendations  Home health PT     Assistance Recommended at Discharge Set up Supervision/Assistance  Patient can return home with the following A little help with bathing/dressing/bathroom;Assistance with cooking/housework;Help with stairs or ramp for entrance;A little help with walking and/or transfers   Equipment Recommendations  None recommended by PT    Recommendations for Other Services       Precautions / Restrictions Precautions Precautions: None Restrictions Weight Bearing Restrictions: No     Mobility  Bed Mobility Overal bed mobility: Modified Independent                  Transfers Overall transfer level: Modified independent Equipment used: Rolling walker (2 wheels) Transfers: Sit to/from Stand, Bed to chair/wheelchair/BSC Sit to Stand: Modified independent (Device/Increase time)                 Ambulation/Gait Ambulation/Gait assistance: Modified independent (Device/Increase time) Gait Distance (Feet): 40 Feet Assistive device: Rolling walker (2 wheels) Gait Pattern/deviations: Decreased step length - right, Decreased step length - left, Decreased stride length, Trunk flexed       General Gait Details: patient able to ambulate in hallway with RW and min guard/supervision with no loss of balance, limited mostly due to generalized weakness and fatigue   Stairs             Wheelchair Mobility    Modified Rankin (Stroke Patients Only)       Balance                                            Cognition Arousal/Alertness: Awake/alert Behavior During Therapy: WFL for tasks assessed/performed Overall Cognitive Status: Within Functional Limits for tasks assessed                                          Exercises General Exercises - Lower Extremity Ankle Circles/Pumps: 10 reps, Both Hip ABduction/ADduction: Standing (side step at bed x 3 RT) Hip Flexion/Marching:  (sit to stand x 10 reps) Mini-Sqauts: 10 reps    General Comments        Pertinent Vitals/Pain Pain Assessment Pain Assessment: 0-10 Pain Score: 3  Pain Location: stomach Pain Descriptors / Indicators: Aching Pain Intervention(s): Limited  activity within patient's tolerance    Home Living                          Prior Function     Ambulates with a cane.  Live alone        PT Goals (current goals can now be found in the care plan section) Acute Rehab PT Goals Patient Stated Goal: return home PT Goal Formulation: With patient Time For Goal Achievement: 11/15/22 Potential to Achieve Goals: Good    Frequency    Min 3X/week      PT Plan Current plan remains appropriate          End of Session Equipment Utilized During Treatment: Gait belt Activity Tolerance: Patient tolerated treatment well;Patient limited by fatigue Patient  left: in chair;with call bell/phone within reach Nurse Communication: Mobility status PT Visit Diagnosis: Unsteadiness on feet (R26.81);Other abnormalities of gait and mobility (R26.89);Muscle weakness (generalized) (M62.81)     Time: 1330-1401 PT Time Calculation (min) (ACUTE ONLY): 31 min  Charges:  $Gait Training: 8-22 mins $Therapeutic Exercise: 8-22 mins                       Virgina Organ, PT CLT [phone]  11/09/2022, 2:45 PM

## 2022-11-09 NOTE — Progress Notes (Signed)
Pts HR is still sustaining in the 130's-140's. Not symptomatic at this time.

## 2022-11-09 NOTE — Progress Notes (Signed)
   11/09/22 0500  Assess: MEWS Score  Temp 98.7 F (37.1 C)  BP 124/74  MAP (mmHg) 82  Pulse Rate (!) 135  ECG Heart Rate (!) 136  Resp 17  Level of Consciousness Alert  SpO2 96 %  O2 Device Room Air  Assess: MEWS Score  MEWS Temp 0  MEWS Systolic 0  MEWS Pulse 3  MEWS RR 0  MEWS LOC 0  MEWS Score 3  MEWS Score Color Yellow  Assess: if the MEWS score is Yellow or Red  Were vital signs taken at a resting state? Yes  Focused Assessment Change from prior assessment (see assessment flowsheet)  Does the patient meet 2 or more of the SIRS criteria? No  MEWS guidelines implemented *See Row Information* No, previously red, continue vital signs every 4 hours  Treat  MEWS Interventions Other (Comment)  Pain Scale 0-10  Pain Score 0  Notify: Charge Nurse/RN  Name of Charge Nurse/RN Notified Edgar Frisk, RN  Date Charge Nurse/RN Notified 11/09/22  Time Charge Nurse/RN Notified [phone]  Provider Notification  Provider Name/Title O.Thomes Dinning, DO  Date Provider Notified 11/09/22  Time Provider Notified [phone]  Method of Notification Page  Notification Reason Change in status  Provider response No new orders  Date of Provider Response 11/09/22  Time of Provider Response [phone]  Document  Patient Outcome Stabilized after interventions  Progress note created (see row info) Yes  Assess: SIRS CRITERIA  SIRS Temperature  0  SIRS Pulse 1  SIRS Respirations  0  SIRS WBC 1  SIRS Score Sum  2

## 2022-11-10 DIAGNOSIS — I959 Hypotension, unspecified: Secondary | ICD-10-CM | POA: Diagnosis not present

## 2022-11-10 LAB — BASIC METABOLIC PANEL
Anion gap: 5 (ref 5–15)
BUN: 11 mg/dL (ref 8–23)
CO2: 26 mmol/L (ref 22–32)
Calcium: 8.2 mg/dL — ABNORMAL LOW (ref 8.9–10.3)
Chloride: 106 mmol/L (ref 98–111)
Creatinine, Ser: 0.46 mg/dL (ref 0.44–1.00)
GFR, Estimated: >60 mL/min (ref >60)
Glucose, Bld: 102 mg/dL — ABNORMAL HIGH (ref 70–99)
Potassium: 3.7 mmol/L (ref 3.5–5.1)
Sodium: 137 mmol/L (ref 135–145)

## 2022-11-10 LAB — HEMOGLOBIN A1C
Hgb A1c MFr Bld: 8.5 % — ABNORMAL HIGH (ref 4.8–5.6)
Mean Plasma Glucose: 197 mg/dL

## 2022-11-10 LAB — LEGIONELLA PNEUMOPHILA SEROGP 1 UR AG: L. pneumophila Serogp 1 Ur Ag: NEGATIVE

## 2022-11-10 LAB — URINE CULTURE: Culture: <10000 — AB

## 2022-11-10 LAB — MAGNESIUM: Magnesium: 1.4 mg/dL — ABNORMAL LOW (ref 1.7–2.4)

## 2022-11-10 LAB — CBC
HCT: 40.7 % (ref 36.0–46.0)
Hemoglobin: 13.2 g/dL (ref 12.0–15.0)
MCH: 32.5 pg (ref 26.0–34.0)
MCHC: 32.4 g/dL (ref 30.0–36.0)
MCV: 100.2 fL — ABNORMAL HIGH (ref 80.0–100.0)
Platelets: 260 10*3/uL (ref 150–400)
RBC: 4.06 MIL/uL (ref 3.87–5.11)
RDW: 12.6 % (ref 11.5–15.5)
WBC: 7.7 10*3/uL (ref 4.0–10.5)
nRBC: 0 % (ref 0.0–0.2)

## 2022-11-10 LAB — GLUCOSE, CAPILLARY
Glucose-Capillary: 95 mg/dL (ref 70–99)
Glucose-Capillary: 149 mg/dL — ABNORMAL HIGH (ref 70–99)

## 2022-11-10 MED ORDER — MAGNESIUM SULFATE 2 GM/50ML IV SOLN
2.0000 g | Freq: Once | INTRAVENOUS | Status: AC
  Administered 2022-11-10: 2 g via INTRAVENOUS
  Filled 2022-11-10: qty 50

## 2022-11-10 MED ORDER — LOPERAMIDE HCL 2 MG PO TABS: 2.0000 mg | ORAL_TABLET | Freq: Four times a day (QID) | ORAL | 0 refills | Status: DC | PRN

## 2022-11-10 MED ORDER — CEPHALEXIN 500 MG PO CAPS: 500.0000 mg | ORAL_CAPSULE | Freq: Four times a day (QID) | ORAL | 0 refills | Status: AC

## 2022-11-10 MED ORDER — ONDANSETRON HCL 4 MG PO TABS: 4.0000 mg | ORAL_TABLET | Freq: Four times a day (QID) | ORAL | 0 refills | Status: DC | PRN

## 2022-11-10 NOTE — TOC Transition Note (Addendum)
Transition of Care [facility]) - CM/SW Discharge Note   Patient Details  Name: Miranda Sampson MRN: [id number] Date of Birth: 1951/04/02  Transition [facility] Care [facility]) CM/SW Contact:  Elliot Gault, LCSW Phone Number: 11/10/2022, 9:50 AM   Clinical Narrative:     Pt with orders for dc home with HHPT today. Updated Clifton Custard at [facility] and they will follow upon dc.  There are no other TOC needs for dc.  1522: RW arranged with [employer] and delivered to pt's room.  Final next level of care: Home w Home Health Services Barriers to Discharge: Barriers Resolved   Patient Goals and CMS Choice Patient states their goals for this hospitalization and ongoing recovery are:: to get help CMS Medicare.gov Compare Post Acute Care list provided to:: Patient Choice offered to / list presented to : Patient  Discharge Placement                       Discharge Plan and Services                          HH Arranged: PT   Date [facility] Agency Contacted: 11/08/22   Representative spoke with at [facility] Agency: Clifton Custard  Social Determinants of Health (SDOH) Interventions     Readmission Risk Interventions     No data to display

## 2022-11-10 NOTE — Progress Notes (Signed)
Patient discharged home with instructions given on medications and follow up visits,patient  verbalized understanding. Prescriptions sent to Pharmacy of choice documented on AVS. Accompanied by staff to an awaiting vehicle. 

## 2022-11-10 NOTE — Care Management Important Message (Signed)
Important Message  Patient Details  Name: LUVADA SALAMONE MRN: [id number] Date of Birth: [birth year]   Medicare Important Message Given:  N/A - LOS <3 / Initial given by admissions     Corey Harold 11/10/2022, 10:33 AM

## 2022-11-10 NOTE — Discharge Summary (Signed)
Physician Discharge Summary  Miranda Sampson:[id number] DOB: 30-Dec-1950 DOA: 11/07/2022  PCP: Benetta Spar, MD  Admit date: 11/07/2022  Discharge date: 11/10/2022  Admitted From:Home  Disposition:  Home  Recommendations for Outpatient Follow-up:  Follow up with PCP in 1-2 weeks Continue on Keflex as prescribed for treatment of pneumonia and possible UTI for 3 more days as prescribed Continue other home medications as prior  Home Health: Yes with PT  Equipment/Devices: None  Discharge Condition:Stable  CODE STATUS: Full  Diet recommendation: Heart Healthy/carb modified  Brief/Interim Summary:  Miranda Sampson is a 71 y.o. female with medical history significant of diabetes on insulin, GERD, who presented to the emergency room yesterday evening with a 3 to 4-day history of persistent nausea, vomiting, diarrhea, decreased p.o. intake.  She was generally weak, dizzy on standing and had a fall prior to admission.  She was admitted for symptoms of hypotension and hypoglycemia as well as possible pneumonia in the setting of acute gastroenteritis.  A urinalysis was also obtained on account of some complaints of dysuria and she was noted to have some signs of UTI noted.  Urine cultures were obtained and are currently pending, but she seems stable enough for discharge on Keflex as prescribed for 3 more days to complete a total 5-day course of treatment to address not only the pneumonia but also a UTI.  She is tolerating her diet now with no further GI symptoms and has been seen by PT with recommendations for home health.  Discharge Diagnoses:  Principal Problem:   Hypotension Active Problems:   Diabetes mellitus (HCC)   Hyperlipidemia   Essential hypertension, benign   Uncontrolled type 2 diabetes mellitus with hypoglycemia, with long-term current use of insulin (HCC)   Gastroenteritis   Generalized weakness   CAP (community acquired pneumonia)   UTI (urinary tract  infection)  Principal discharge diagnosis: Orthostasis with hyperglycemia in the setting of dehydration with recent acute gastroenteritis.  Noted pneumonia and UTI.  Discharge Instructions  Discharge Instructions     Diet - low sodium heart healthy   Complete by: As directed    Increase activity slowly   Complete by: As directed       Allergies as of 11/10/2022   Not on File      Medication List     TAKE these medications    aspirin EC 81 MG tablet Take 81 mg by mouth daily.   atorvastatin 20 MG tablet Commonly known as: LIPITOR Take 20 mg by mouth daily.   cephALEXin 500 MG capsule Commonly known as: KEFLEX Take 1 capsule (500 mg total) by mouth 4 (four) times daily for 3 days.   gabapentin 100 MG capsule Commonly known as: NEURONTIN Take 100 mg by mouth at bedtime.   glucose blood test strip Commonly known as: Accu-Chek Aviva Use as instructed to monitor FSBS 2x daily. Dx: E11.9   ibuprofen 200 MG tablet Commonly known as: ADVIL Take 1 tablet (200 mg total) by mouth 2 (two) times daily.   Lantus SoloStar 100 UNIT/ML Solostar Pen Generic drug: insulin glargine Inject 40 Units into the skin at bedtime.   loperamide 2 MG tablet Commonly known as: Imodium A-D Take 1 tablet (2 mg total) by mouth 4 (four) times daily as needed for diarrhea or loose stools.   meclizine 25 MG tablet Commonly known as: ANTIVERT Take 25 mg by mouth 3 (three) times daily as needed.   metoprolol tartrate 25 MG tablet Commonly known as:  LOPRESSOR Take 0.5 tablets (12.5 mg total) by mouth 2 (two) times daily.   omeprazole 20 MG capsule Commonly known as: PRILOSEC Take 20 mg by mouth daily.   ondansetron 4 MG tablet Commonly known as: ZOFRAN Take 1 tablet (4 mg total) by mouth every 6 (six) hours as needed for nausea.   pantoprazole 40 MG tablet Commonly known as: PROTONIX TAKE 1 TABLET TWICE DAILY (BEFORE BREAKFAST AND EVENING MEAL)   Tradjenta 5 MG Tabs tablet Generic  drug: linagliptin Take 5 mg by mouth daily.        Follow-up Information     [facility].   Specialty: Home Health Services Why: agency will provide home health physical and occupational therapy Contact information: [address] [place] [address] [phone]         Benetta Spar, MD. Schedule an appointment as soon as possible for a visit in 1 week(s).   Specialty: Internal Medicine Contact information: [address] [place] [address] [phone]                Not on File  Consultations: None   Procedures/Studies: DG Chest Port 1 View  Result Date: 11/08/2022 CLINICAL DATA:  weakness EXAM: PORTABLE CHEST - 1 VIEW COMPARISON:  05/23/2016 FINDINGS: The heart size and mediastinal contours are within normal limits. There is diffuse pulmonary interstitial prominence which could be seen with atypical infection, edema or interstitial lung disease. There is no focal consolidation. Aorta is calcified. The visualized skeletal structures are unremarkable. IMPRESSION: Nonspecific interstitial prominence consistent with atypical infection, edema or interstitial lung disease. No focal consolidation. Electronically Signed   By: Layla Maw M.D.   On: 11/08/2022 08:07   CT ABDOMEN PELVIS W CONTRAST  Result Date: 11/07/2022 CLINICAL DATA:  Generalized weakness and abdominal pain since Thursday EXAM: CT ABDOMEN AND PELVIS WITH CONTRAST TECHNIQUE: Multidetector CT imaging of the abdomen and pelvis was performed using the standard protocol following bolus administration of intravenous contrast. RADIATION DOSE REDUCTION: This exam was performed according to the departmental dose-optimization program which includes automated exposure control, adjustment of the mA and/or kV according to patient size and/or use of iterative reconstruction technique. CONTRAST:  OMNIPAQUE IOHEXOL 300 MG/ML  SOLN COMPARISON:  CT  03/23/2022 FINDINGS: Lower chest: Chronic interstitial lung disease in the lower lungs. Mitral annular calcification. Normal heart size. No pericardial effusion. Hepatobiliary: Periportal edema. No suspicious focal hepatic lesion. Biliary dilation. Pancreas: Unremarkable. No pancreatic ductal dilatation or surrounding inflammatory changes. Spleen: Normal in size without focal abnormality. Adrenals/Urinary Tract: Normal adrenal glands. Bilateral cortical renal scarring. Nonobstructing stone in the lower pole of the right kidney. No hydronephrosis. Stomach/Bowel: Stomach is unremarkable. Normal caliber large and small bowel. No bowel wall thickening. Bowel anastomosis near the hepatic flexure. Vascular/Lymphatic: Aortic atherosclerosis. No enlarged abdominal or pelvic lymph nodes. Reproductive: Multiple calcified and noncalcified heterogenous uterine fibroids. Other: Trace free fluid in the pelvis. Mild mesenteric edema. Body wall anasarca. Musculoskeletal: Advanced degenerative changes in the thoracolumbar spine. No acute abnormality. IMPRESSION: Mesenteric, periportal, and body wall edema likely related to fluid status. Otherwise no acute abnormality. Interstitial lung disease in the lung bases. Nonobstructing right nephrolithiasis. Fibroid uterus. Aortic Atherosclerosis (ICD10-I70.0). Electronically Signed   By: Minerva Fester M.D.   On: 11/07/2022 22:22     Discharge Exam: Vitals:   11/10/22 0443 11/10/22 0832  BP: 135/84 (!) 137/94  Pulse: 70 75  Resp: 19   Temp: 99 F (37.2  C) 97.8 F (36.6 C)  SpO2: 96% 100%   Vitals:   11/09/22 1508 11/09/22 2038 11/10/22 0443 11/10/22 0832  BP: 134/84 99/77 135/84 (!) 137/94  Pulse: 83 75 70 75  Resp: Temp: 98.8 F (37.1 C) 98.7 F (37.1 C) 99 F (37.2 C) 97.8 F (36.6 C)  TempSrc: Oral Oral  Oral  SpO2: 95% 98% 96% 100%  Height:        General: Pt is alert, awake, not in acute distress Cardiovascular: RRR, S1/S2 +, no rubs, no  gallops Respiratory: CTA bilaterally, no wheezing, no rhonchi Abdominal: Soft, NT, ND, bowel sounds + Extremities: no edema, no cyanosis    The results of significant diagnostics from this hospitalization (including imaging, microbiology, ancillary and laboratory) are listed below for reference.     Microbiology: Recent Results (from the past 240 hour(s))  Resp Panel by RT-PCR (Flu A&B, Covid) Anterior Nasal Swab     Status: None   Collection Time: 11/07/22  8:55 PM   Specimen: Anterior Nasal Swab  Result Value Ref Range Status   SARS Coronavirus 2 by RT PCR NEGATIVE NEGATIVE Final    Comment: (NOTE) SARS-CoV-2 target nucleic acids are NOT DETECTED.  The SARS-CoV-2 RNA is generally detectable in upper respiratory specimens during the acute phase of infection. The lowest concentration of SARS-CoV-2 viral copies this assay can detect is 138 copies/mL. A negative result does not preclude SARS-Cov-2 infection and should not be used as the sole basis for treatment or other patient management decisions. A negative result may occur with  improper specimen collection/handling, submission of specimen other than nasopharyngeal swab, presence of viral mutation(s) within the areas targeted by this assay, and inadequate number of viral copies(<138 copies/mL). A negative result must be combined with clinical observations, patient history, and epidemiological information. The expected result is Negative.  Fact Sheet for Patients:  BloggerCourse.com  Fact Sheet for Healthcare Providers:  SeriousBroker.it  This test is no t yet approved or cleared by the [place] FDA and  has been authorized for detection and/or diagnosis of SARS-CoV-2 by FDA under an Emergency Use Authorization (EUA). This EUA will remain  in effect (meaning this test can be used) for the duration of the COVID-19 declaration under Section 564(b)(1) of the Act,  21 U.S.C.section 360bbb-3(b)(1), unless the authorization is terminated  or revoked sooner.       Influenza A by PCR NEGATIVE NEGATIVE Final   Influenza B by PCR NEGATIVE NEGATIVE Final    Comment: (NOTE) The Xpert Xpress SARS-CoV-2/FLU/RSV plus assay is intended as an aid in the diagnosis of influenza from Nasopharyngeal swab specimens and should not be used as a sole basis for treatment. Nasal washings and aspirates are unacceptable for Xpert Xpress SARS-CoV-2/FLU/RSV testing.  Fact Sheet for Patients: BloggerCourse.com  Fact Sheet for Healthcare Providers: SeriousBroker.it  This test is not yet approved or cleared by the [place] FDA and has been authorized for detection and/or diagnosis of SARS-CoV-2 by FDA under an Emergency Use Authorization (EUA). This EUA will remain in effect (meaning this test can be used) for the duration of the COVID-19 declaration under Section 564(b)(1) of the Act, 21 U.S.C. section 360bbb-3(b)(1), unless the authorization is terminated or revoked.  Performed at [facility], [address], [place], [place] [address]   Culture, blood (Routine X 2) w Reflex to ID Panel     Status: None (Preliminary result)   Collection Time: 11/08/22 10:05 AM   Specimen:  BLOOD  Result Value Ref Range Status   Specimen Description BLOOD Lt Wrist  Final   Special Requests   Final    BOTTLES DRAWN AEROBIC AND ANAEROBIC BLOOD LEFT WRIST   Culture   Final    NO GROWTH 1 DAY Performed at [facility], [address] [place], [place] [address]    Report Status PENDING  Incomplete  Culture, blood (Routine X 2) w Reflex to ID Panel     Status: None (Preliminary result)   Collection Time: 11/08/22 10:05 AM   Specimen: BLOOD  Result Value Ref Range Status   Specimen Description BLOOD BLOOD RIGHT HAND AEROBIC BOTTLE ONLY  Final   Special Requests   Final    Blood Culture results may not be optimal due to an  inadequate volume of blood received in culture bottles   Culture   Final    NO GROWTH 1 DAY Performed at [facility], [address] [place], [place] [address]    Report Status PENDING  Incomplete     Labs: BNP (last 3 results) No results for input(s): "BNP" in the last 8760 hours. Basic Metabolic Panel: Recent Labs  Lab 11/07/22 1955 11/08/22 0909 11/09/22 0543 11/10/22 0503  NA 131* 136 139 137  K 3.6 3.9 3.1* 3.7  CL 92* 101 108 106  CO2 28 29 25 26   GLUCOSE 387* 61* 68* 102*  BUN 46* 39* 20 11  CREATININE 0.92 0.72 0.45 0.46  CALCIUM 10.1 8.9 8.3* 8.2*  MG  --   --   --  1.4*   Liver Function Tests: Recent Labs  Lab 11/07/22 1955 11/09/22 0543  AST 15 15  ALT 12 11  ALKPHOS 77 54  BILITOT 1.8* 1.0  PROT 7.5 6.5  ALBUMIN 3.2* 2.5*   Recent Labs  Lab 11/07/22 1955  LIPASE 26   No results for input(s): "AMMONIA" in the last 168 hours. CBC: Recent Labs  Lab 11/07/22 1955 11/08/22 0909 11/09/22 0543 11/10/22 0503  WBC 13.0* 10.4 11.2* 7.7  NEUTROABS  --  7.8*  --   --   HGB 16.1* 14.9 14.8 13.2  HCT 46.8* 45.1 45.2 40.7  MCV 96.9 101.3* 100.7* 100.2*  PLT 337 237 284 260   Cardiac Enzymes: No results for input(s): "CKTOTAL", "CKMB", "CKMBINDEX", "TROPONINI" in the last 168 hours. BNP: Invalid input(s): "POCBNP" CBG: Recent Labs  Lab 11/09/22 1152 11/09/22 1618 11/09/22 2040 11/09/22 2110 11/10/22 0734  GLUCAP 100* 144* 64* 104* 95   D-Dimer No results for input(s): "DDIMER" in the last 72 hours. Hgb A1c Recent Labs    11/09/22 0543  HGBA1C 8.5*   Lipid Profile No results for input(s): "CHOL", "HDL", "LDLCALC", "TRIG", "CHOLHDL", "LDLDIRECT" in the last 72 hours. Thyroid function studies No results for input(s): "TSH", "T4TOTAL", "T3FREE", "THYROIDAB" in the last 72 hours.  Invalid input(s): "FREET3" Anemia work up No results for input(s): "VITAMINB12", "FOLATE", "FERRITIN", "TIBC", "IRON", "RETICCTPCT" in the last 72  hours. Urinalysis    Component Value Date/Time   COLORURINE YELLOW 11/08/2022 1120   APPEARANCEUR HAZY (A) 11/08/2022 1120   LABSPEC >1.046 (H) 11/08/2022 1120   PHURINE 5.0 11/08/2022 1120   GLUCOSEU 150 (A) 11/08/2022 1120   HGBUR MODERATE (A) 11/08/2022 1120   BILIRUBINUR NEGATIVE 11/08/2022 1120   KETONESUR NEGATIVE 11/08/2022 1120   PROTEINUR 30 (A) 11/08/2022 1120   UROBILINOGEN 1 01/21/2015 1511   NITRITE NEGATIVE 11/08/2022 1120   LEUKOCYTESUR SMALL (A) 11/08/2022 1120   Sepsis Labs  Recent Labs  Lab 11/07/22 1955 11/08/22 0909 11/09/22 0543 11/10/22 0503  WBC 13.0* 10.4 11.2* 7.7   Microbiology Recent Results (from the past 240 hour(s))  Resp Panel by RT-PCR (Flu A&B, Covid) Anterior Nasal Swab     Status: None   Collection Time: 11/07/22  8:55 PM   Specimen: Anterior Nasal Swab  Result Value Ref Range Status   SARS Coronavirus 2 by RT PCR NEGATIVE NEGATIVE Final    Comment: (NOTE) SARS-CoV-2 target nucleic acids are NOT DETECTED.  The SARS-CoV-2 RNA is generally detectable in upper respiratory specimens during the acute phase of infection. The lowest concentration of SARS-CoV-2 viral copies this assay can detect is 138 copies/mL. A negative result does not preclude SARS-Cov-2 infection and should not be used as the sole basis for treatment or other patient management decisions. A negative result may occur with  improper specimen collection/handling, submission of specimen other than nasopharyngeal swab, presence of viral mutation(s) within the areas targeted by this assay, and inadequate number of viral copies(<138 copies/mL). A negative result must be combined with clinical observations, patient history, and epidemiological information. The expected result is Negative.  Fact Sheet for Patients:  BloggerCourse.com  Fact Sheet for Healthcare Providers:  SeriousBroker.it  This test is no t yet approved or  cleared by the [place] FDA and  has been authorized for detection and/or diagnosis of SARS-CoV-2 by FDA under an Emergency Use Authorization (EUA). This EUA will remain  in effect (meaning this test can be used) for the duration of the COVID-19 declaration under Section 564(b)(1) of the Act, 21 U.S.C.section 360bbb-3(b)(1), unless the authorization is terminated  or revoked sooner.       Influenza A by PCR NEGATIVE NEGATIVE Final   Influenza B by PCR NEGATIVE NEGATIVE Final    Comment: (NOTE) The Xpert Xpress SARS-CoV-2/FLU/RSV plus assay is intended as an aid in the diagnosis of influenza from Nasopharyngeal swab specimens and should not be used as a sole basis for treatment. Nasal washings and aspirates are unacceptable for Xpert Xpress SARS-CoV-2/FLU/RSV testing.  Fact Sheet for Patients: BloggerCourse.com  Fact Sheet for Healthcare Providers: SeriousBroker.it  This test is not yet approved or cleared by the [place] FDA and has been authorized for detection and/or diagnosis of SARS-CoV-2 by FDA under an Emergency Use Authorization (EUA). This EUA will remain in effect (meaning this test can be used) for the duration of the COVID-19 declaration under Section 564(b)(1) of the Act, 21 U.S.C. section 360bbb-3(b)(1), unless the authorization is terminated or revoked.  Performed at [facility], [address] [place], [place] [address]   Culture, blood (Routine X 2) w Reflex to ID Panel     Status: None (Preliminary result)   Collection Time: 11/08/22 10:05 AM   Specimen: BLOOD  Result Value Ref Range Status   Specimen Description BLOOD Lt Wrist  Final   Special Requests   Final    BOTTLES DRAWN AEROBIC AND ANAEROBIC BLOOD LEFT WRIST   Culture   Final    NO GROWTH 1 DAY Performed at [facility], [address], [place], [place] [address]    Report Status PENDING  Incomplete  Culture, blood (Routine X 2) w Reflex  to ID Panel     Status: None (Preliminary result)   Collection Time: 11/08/22 10:05 AM   Specimen: BLOOD  Result Value Ref Range Status   Specimen Description BLOOD BLOOD RIGHT HAND AEROBIC BOTTLE ONLY  Final   Special Requests   Final  Blood Culture results may not be optimal due to an inadequate volume of blood received in culture bottles   Culture   Final    NO GROWTH 1 DAY Performed at [facility], [address] [place], [place] [id number]    Report Status PENDING  Incomplete     Time coordinating discharge: 35 minutes  SIGNED:   Erick BlinksPratik D Grove Defina, DO [facility] 11/10/2022, 9:41 AM  If 7PM-7AM, please contact night-coverage [email or web address]

## 2022-11-13 LAB — CULTURE, BLOOD (ROUTINE X 2)
Culture: NO GROWTH
Culture: NO GROWTH

## 2022-11-16 ENCOUNTER — Other Ambulatory Visit (HOSPITAL_COMMUNITY): Payer: Self-pay | Admitting: Gerontology

## 2022-11-16 ENCOUNTER — Ambulatory Visit (HOSPITAL_COMMUNITY)
Admission: RE | Admit: 2022-11-16 | Discharge: 2022-11-16 | Disposition: A | Discharge status: Home / Self Care | Payer: Medicare Other | Source: Ambulatory Visit | Attending: Gerontology | Admitting: Gerontology

## 2022-11-16 DIAGNOSIS — R062 Wheezing: Secondary | ICD-10-CM | POA: Diagnosis present

## 2022-12-30 ENCOUNTER — Ambulatory Visit (INDEPENDENT_AMBULATORY_CARE_PROVIDER_SITE_OTHER): Payer: 59 | Admitting: Internal Medicine

## 2022-12-30 ENCOUNTER — Encounter: Payer: Self-pay | Admitting: Internal Medicine

## 2022-12-30 VITALS — BP 138/74 | HR 83 | Temp 98.6°F | Ht 71.0 in | Wt 146.0 lb

## 2022-12-30 DIAGNOSIS — J849 Interstitial pulmonary disease, unspecified: Secondary | ICD-10-CM | POA: Diagnosis not present

## 2022-12-30 DIAGNOSIS — R683 Clubbing of fingers: Secondary | ICD-10-CM

## 2022-12-30 DIAGNOSIS — R0989 Other specified symptoms and signs involving the circulatory and respiratory systems: Secondary | ICD-10-CM | POA: Diagnosis not present

## 2022-12-30 DIAGNOSIS — R0609 Other forms of dyspnea: Secondary | ICD-10-CM | POA: Diagnosis not present

## 2022-12-30 NOTE — Addendum Note (Signed)
Addended by: Loma Sousa on: 12/30/2022 04:55 PM   Modules accepted: Orders

## 2022-12-30 NOTE — Progress Notes (Signed)
OV 12/30/2022  Subjective:  Patient ID: Miranda Sampson, female , DOB: 01/15/1951 , age 72 y.o. , MRN: [id number] , ADDRESS: [address] [place] [address] PCP Benetta Spar, MD Patient Care Team: Benetta Spar, MD as PCP - General (Internal Medicine)  This Provider for this visit: Treatment Team:  Attending Provider: Kalman Shan, MD    12/30/2022 -   Chief Complaint  Patient presents with   Follow-up   Consult    Ild review      HPI Miranda Sampson 72 y.o. -     [facility] Comprehensive ILD Questionnaire  Symptoms:  -72 year old female presents with her daughter.  This daughter lives in [place].  The daughter lives down the street and is the usual caretaker.  According to the patient and the daughter around Thanksgiving 2023 was admitted for 3 days [confirmed by external chart review] to [facility].  With hypotension and hypoglycemia and gastroenteritis possible UTI.  Possible pneumonia.  At this time they were told that she might have pulmonary fibrosis [2009 abdominal CT scan personally visualized lung cut did not show any ILD, April 2023 neck CT scan abdomen did show pulmonary fibrosis not otherwise specified] therefore she is being referred here.  She admits has been dyspneic for a year.  Is slowly getting worse.  Present with exertion relieved by rest.  She think she can walk half a mile with a cane [been using a cane after knee issues for the last 2 years] but the daughter says she can only walk from the room to the desk.  Current symptom severity is as below.  SYMPTOM SCALE - ILD 12/30/2022  Current weight   O2 use ra  Shortness of Breath 0 -> 5 scale with 5 being worst (score 6 If unable to do)  At rest 4  Simple tasks - showers, clothes change, eating, shaving 4  Household (dishes, doing bed, laundry) 5  Shopping 0  Walking level at own pace 4  Walking up Stairs 4  Total (30-36) Dyspnea Score 21       Non-dyspnea symptoms (0-> 5 scale) 12/30/2022  How bad is your cough? 3  How bad is your fatigue 5  How bad is nausea 0  How bad is vomiting?  0  How bad is diarrhea? 0  How bad is anxiety? 0  How bad is depression 00  Any chronic pain - if so where and how bad 0     Past Medical History :  She did not fill the past medical history section of the ILD questionnaire but she denied any connective tissue disease   ROS:  Did not fill up this part of the ILD questionnaire  FAMILY HISTORY of LUNG DISEASE:  Did not fill up this part of the ILD questionnaire  PERSONAL EXPOSURE HISTORY:  on-smoker no marijuana no cocaine no vaping  HOME  EXPOSURE and HOBBY DETAILS :  Just prior to the pandemic she lived in a house for 8 years.  It had a lot of mold in it.  Then she moved to the current house which does not have any mold.    She is denying any down pillow or soa  No pet birds  OCCUPATIONAL HISTORY (122 questions) : She worked in the [employer] in the mid 90s for a few years and retired.  She got exposed a lot of Jan dust but no nylon dust.  PULMONARY TOXICITY  HISTORY (27 items):  Review of the records and her history does not seem like she has been on nitrofurantoin or amiodarone  INVESTIGATIONS: Creatinine 0.46 mg percent November 2023 Hemoglobin 13.2 g% November 2023.  CT Chest data - April 2023 personally visualized.  And agree with the findings   IMPRESSION: 1. Moderate severity pulmonary fibrosis 2. 5 mm nonobstructing right renal calculus. 3. Multiple calcified and noncalcified heterogeneous uterine fibroids. 4. Marked severity degenerative changes at the levels of L2-L3 and L4-L5. 5. Aortic atherosclerosis.   Aortic Atherosclerosis (ICD10-I70.0).     Electronically Signed   By: Aram Candela M.D.   On: 03/23/2022 01:42         Latest Reference Range & Units 11/10/08 11:50 01/22/14 09:09 05/07/14 09:03 01/21/15 15:11 04/24/15 14:57  09/28/15 11:28 01/06/16 16:37 05/23/16 15:00 06/07/16 10:51 09/07/16 11:47 01/09/17 12:01 04/11/17 14:27 03/23/22 01:39 11/07/22 19:55 11/08/22 09:09 11/09/22 05:43 11/10/22 05:03  Creatinine 0.44 - 1.00 mg/dL 3.01 6.01 0.93 2.35 5.73 0.62 0.56 0.69 0.66 0.79 0.74 0.74 0.73 0.92 0.72 0.45 0.46    No results found.    PFT      No data to display             has a past medical history of Allergy, Arthritis, Depression, Diabetes mellitus, GERD (gastroesophageal reflux disease), Hyperlipidemia, Hypertension, and Substance abuse (HCC).   reports that she has never smoked. She has never used smokeless tobacco.  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  1996   ESOPHAGOGASTRODUODENOSCOPY N/A 03/20/2014   Procedure: ESOPHAGOGASTRODUODENOSCOPY (EGD);  Surgeon: Malissa Hippo, MD;  Location: [facility];  Service: Endoscopy;  Laterality: N/A;  140   TONSILLECTOMY      Not on File  Immunization History  Administered Date(s) Administered   Influenza,inj,Quad PF,6+ Mos 01/22/2014, 09/15/2014, 09/28/2015, 09/07/2016   Pneumococcal Conjugate-13 04/24/2015   Pneumococcal Polysaccharide-23 01/22/2014    Family History  Problem Relation Age of Onset   Heart disease Mother    Diabetes Mother    Diabetes Father    Cancer Sister        leukemia    Colon cancer Neg Hx    Stomach cancer Neg Hx      Current Outpatient Medications:    aspirin EC 81 MG tablet, Take 81 mg by mouth daily., Disp: , Rfl:    atorvastatin (LIPITOR) 20 MG tablet, Take 20 mg by mouth daily., Disp: , Rfl:    gabapentin (NEURONTIN) 100 MG capsule, Take 100 mg by mouth at bedtime., Disp: , Rfl:    glucose blood (ACCU-CHEK AVIVA) test strip, Use as instructed to monitor FSBS 2x daily. Dx: E11.9, Disp: 100 each, Rfl: 12   ibuprofen (ADVIL,MOTRIN) 200 MG tablet, Take 1 tablet (200 mg total) by mouth 2 (two) times daily., Disp: 30 tablet, Rfl: 3   LANTUS SOLOSTAR 100 UNIT/ML  Solostar Pen, Inject 40 Units into the skin at bedtime., Disp: , Rfl:    loperamide (IMODIUM A-D) 2 MG tablet, Take 1 tablet (2 mg total) by mouth 4 (four) times daily as needed for diarrhea or loose stools., Disp: 30 tablet, Rfl: 0   meclizine (ANTIVERT) 25 MG tablet, Take 25 mg by mouth 3 (three) times daily as needed., Disp: , Rfl:    metoprolol tartrate (LOPRESSOR) 25 MG tablet, Take 0.5 tablets (12.5 mg total) by mouth 2 (two) times daily. (Patient not taking: Reported on 11/07/2022), Disp: 90 tablet, Rfl: 3  omeprazole (PRILOSEC) 20 MG capsule, Take 20 mg by mouth daily. (Patient not taking: Reported on 11/08/2022), Disp: , Rfl:    ondansetron (ZOFRAN) 4 MG tablet, Take 1 tablet (4 mg total) by mouth every 6 (six) hours as needed for nausea. (Patient not taking: Reported on 12/30/2022), Disp: 20 tablet, Rfl: 0   pantoprazole (PROTONIX) 40 MG tablet, TAKE 1 TABLET TWICE DAILY (BEFORE BREAKFAST AND EVENING MEAL) (Patient not taking: Reported on 11/08/2022), Disp: 60 tablet, Rfl: 0   TRADJENTA 5 MG TABS tablet, Take 5 mg by mouth daily., Disp: , Rfl:       Objective:   Vitals:   12/30/22 1604  BP: 138/74  Pulse: 83  Temp: 98.6 F (37 C)  TempSrc: Oral  SpO2: 96%  Weight: 146 lb (66.2 kg)  Height: 5\' 11"  (1.803 m)    Estimated body mass index is 20.36 kg/m as calculated from the following:   Height as of this encounter: 5\' 11"  (1.803 m).   Weight as of this encounter: 146 lb (66.2 kg).  @WEIGHTCHANGE @  [employer]   12/30/22 1604  Weight: 146 lb (66.2 kg)     Physical Exam  General: No distress.  Thin frail lady but pleasant historian has a cane. Neuro: Alert and Oriented x 3. GCS 15. Speech normal Psych: Pleasant Resp:  Barrel Chest - noo.  Wheeze - no, Crackles -bilateral bibasal Velcro crackles present, No overt respiratory distress CVS: Normal heart sounds. Murmurs - no Ext: Stigmata of Connective Tissue Disease - no HEENT: Normal upper airway. PEERL +. No post  nasal drip        Assessment:       ICD-10-CM   1. ILD (interstitial lung disease) (HCC)  J84.9 CT Chest High Resolution    Sed Rate (ESR)    ANA+ENA+DNA/DS+Scl 70+SjoSSA/B    CK Total (and CKMB)    Drug Screen 10 W/Conf, Serum    Hypersensitivity Pneumonitis    Pulmonary function test    Pulse oximetry, overnight    QuantiFERON-TB Gold Plus    Aldolase    CANCELED: Pulmonary function test    CANCELED: Pulse oximetry, overnight    CANCELED: Drug Screen 10 W/Conf, Serum    CANCELED: ANA+ENA+DNA/DS+Scl 70+SjoSSA/B    CANCELED: CK Total (and CKMB)    CANCELED: Aldolase    CANCELED: Hypersensitivity Pneumonitis    CANCELED: QuantiFERON-TB Gold Plus    CANCELED: Aldolase    2. DOE (dyspnea on exertion)  R06.09 ECHOCARDIOGRAM COMPLETE    CANCELED: ECHOCARDIOGRAM COMPLETE    3. Bibasilar crackles  R09.89     4. Clubbing of fingers  R68.3      With age greater than 17, lower lobe crackles at the Velcro in nature, progressive dyspnea, clubbing IPF being the most common variety of ILD.  In the differential diagnosis.  However being a woman NSIP, connective tissue disease ILD are also in the differential.  Having had a bird exposure hypersensitive pneumonitis also in the differential.    Plan:     Patient Instructions     ICD-10-CM   1. ILD (interstitial lung disease) (HCC)  J84.9 CT Chest High Resolution    Pulmonary function test    Pulse oximetry, overnight    Sed Rate (ESR)    Drug Screen 10 W/Conf, Serum    ANA+ENA+DNA/DS+Scl 70+SjoSSA/B    CK Total (and CKMB)    Aldolase    Hypersensitivity Pneumonitis    QuantiFERON-TB Gold Plus    2. DOE (dyspnea  on exertion)  R06.09 ECHOCARDIOGRAM COMPLETE    3. Bibasilar crackles  R09.89     4. Clubbing of fingers  R68.3         - I am concerned you  have Interstitial Lung Disease (ILD) aka Pulmonary Fibrosis  -  There are MANY varieties of this - To narrow down possibilities and assess severity please do the following  tests  - do full PFT  - do overnight oxygen test on room air  - do echo test  - do High Resolution CT chest wo contrast - supine and prone, inspiratory and expiratory images (only Dr Rosario Jacks or Dr Weber Cooks or Dr Polly Cobia or Dr Laqueta Carina to read)  - do autoimmune panel: Serum: ESR, ANA, DS-DNA, RF, anti-CCP, ssA, ssB, scl-70,  Total CK,  Aldolase,  Hypersensitivity Pneumonitis Panel, Quantiferon Gold  Followup  =- 6-8 weeks from now - 30 min visit with Dr Chase Caller to reiew results  - can be video visit   ( Level 05 visit: New 60-74 min   in  visit type: on-site physical face to visit  in total care time and counseling or/and coordination of care by this undersigned MD - Dr Brand Males. This includes one or more of the following on this same day 12/30/2022: pre-charting, chart review, note writing, documentation discussion of test results, diagnostic or treatment recommendations, prognosis, risks and benefits of management options, instructions, education, compliance or risk-factor reduction. It excludes time spent by the [place] or office staff in the care of the patient. Actual time 79 min)   SIGNATURE    Dr. Brand Males, M.D., F.C.C.P,  Pulmonary and Critical Care Medicine Staff Physician, [facility] Director - Interstitial Lung Disease  Program  Pulmonary [place] [facility] [place], [place], [address]  Pager: [phone], If no answer or between  15:00h - 7:00h: call [phone] Telephone: [phone]  4:46 PM 12/30/2022

## 2022-12-30 NOTE — Addendum Note (Signed)
Addended by: Loma Sousa on: 12/30/2022 05:01 PM   Modules accepted: Orders

## 2022-12-30 NOTE — Patient Instructions (Addendum)
ICD-10-CM   1. ILD (interstitial lung disease) (HCC)  J84.9 CT Chest High Resolution    Pulmonary function test    Pulse oximetry, overnight    Sed Rate (ESR)    Drug Screen 10 W/Conf, Serum    ANA+ENA+DNA/DS+Scl 70+SjoSSA/B    CK Total (and CKMB)    Aldolase    Hypersensitivity Pneumonitis    QuantiFERON-TB Gold Plus    2. DOE (dyspnea on exertion)  R06.09 ECHOCARDIOGRAM COMPLETE    3. Bibasilar crackles  R09.89     4. Clubbing of fingers  R68.3         - I am concerned you  have Interstitial Lung Disease (ILD) aka Pulmonary Fibrosis  -  There are MANY varieties of this - To narrow down possibilities and assess severity please do the following tests  - do full PFT  - do overnight oxygen test on room air  - do echo test  - do High Resolution CT chest wo contrast - supine and prone, inspiratory and expiratory images (only Dr Rosario Jacks or Dr Weber Cooks or Dr Polly Cobia or Dr Laqueta Carina to read)  - do autoimmune panel: Serum: ESR, ANA, DS-DNA, RF, anti-CCP, ssA, ssB, scl-70,  Total CK,  Aldolase,  Hypersensitivity Pneumonitis Panel, Quantiferon Gold  Followup  =- 6-8 weeks from now - 30 min visit with Dr Chase Caller to reiew results  - can be video visit

## 2023-01-03 ENCOUNTER — Telehealth: Payer: Self-pay | Admitting: Internal Medicine

## 2023-01-03 NOTE — Telephone Encounter (Signed)
Message from adapt:  Please have doctor specify on order how ONO is to be performed. On air, On CPAP, on O2, etc.  Thanks!

## 2023-01-03 NOTE — Telephone Encounter (Signed)
The overnight oxygen study is to be performed on room air at rest.  However if the patient is already on CPAP then just do it on CPAP on room air

## 2023-01-04 NOTE — Telephone Encounter (Signed)
ATC X1 unable to LVM will call back later. Need to verify if patient currently uses a cpap machine

## 2023-01-10 ENCOUNTER — Other Ambulatory Visit (HOSPITAL_COMMUNITY)
Admission: RE | Admit: 2023-01-10 | Discharge: 2023-01-10 | Disposition: A | Discharge status: Home / Self Care | Payer: 59 | Source: Ambulatory Visit | Attending: Internal Medicine | Admitting: Internal Medicine

## 2023-01-10 DIAGNOSIS — J849 Interstitial pulmonary disease, unspecified: Secondary | ICD-10-CM | POA: Insufficient documentation

## 2023-01-11 LAB — MISC LABCORP TEST (SEND OUT): Labcorp test code: 700841

## 2023-01-12 LAB — MISC LABCORP TEST (SEND OUT): Labcorp test code: 335931

## 2023-01-12 LAB — ALDOLASE: Aldolase: 4.6 U/L (ref 3.3–10.3)

## 2023-01-13 ENCOUNTER — Encounter (HOSPITAL_COMMUNITY): Payer: Self-pay

## 2023-01-13 ENCOUNTER — Other Ambulatory Visit: Payer: Self-pay

## 2023-01-13 ENCOUNTER — Emergency Department (HOSPITAL_COMMUNITY): Payer: 59

## 2023-01-13 ENCOUNTER — Emergency Department (HOSPITAL_COMMUNITY)
Admission: EM | Admit: 2023-01-13 | Discharge: 2023-01-13 | Disposition: A | Discharge status: Home / Self Care | Payer: 59 | Attending: Emergency Medicine | Admitting: Emergency Medicine

## 2023-01-13 DIAGNOSIS — Z794 Long term (current) use of insulin: Secondary | ICD-10-CM | POA: Insufficient documentation

## 2023-01-13 DIAGNOSIS — W19XXXA Unspecified fall, initial encounter: Secondary | ICD-10-CM | POA: Diagnosis not present

## 2023-01-13 DIAGNOSIS — S0990XA Unspecified injury of head, initial encounter: Secondary | ICD-10-CM | POA: Diagnosis present

## 2023-01-13 DIAGNOSIS — R55 Syncope and collapse: Secondary | ICD-10-CM

## 2023-01-13 DIAGNOSIS — I10 Essential (primary) hypertension: Secondary | ICD-10-CM | POA: Diagnosis not present

## 2023-01-13 DIAGNOSIS — E119 Type 2 diabetes mellitus without complications: Secondary | ICD-10-CM | POA: Insufficient documentation

## 2023-01-13 LAB — CBC WITH DIFFERENTIAL/PLATELET
Abs Immature Granulocytes: 0.03 10*3/uL (ref 0.00–0.07)
Basophils Absolute: 0.1 10*3/uL (ref 0.0–0.1)
Basophils Relative: 1 %
Eosinophils Absolute: 0 10*3/uL (ref 0.0–0.5)
Eosinophils Relative: 0 %
HCT: 48.1 % — ABNORMAL HIGH (ref 36.0–46.0)
Hemoglobin: 15.6 g/dL — ABNORMAL HIGH (ref 12.0–15.0)
Immature Granulocytes: 0 %
Lymphocytes Relative: 12 %
Lymphs Abs: 1.1 10*3/uL (ref 0.7–4.0)
MCH: 33.3 pg (ref 26.0–34.0)
MCHC: 32.4 g/dL (ref 30.0–36.0)
MCV: 102.8 fL — ABNORMAL HIGH (ref 80.0–100.0)
Monocytes Absolute: 0.7 10*3/uL (ref 0.1–1.0)
Monocytes Relative: 7 %
Neutro Abs: 7.6 10*3/uL (ref 1.7–7.7)
Neutrophils Relative %: 80 %
Platelets: 253 10*3/uL (ref 150–400)
RBC: 4.68 MIL/uL (ref 3.87–5.11)
RDW: 12.8 % (ref 11.5–15.5)
WBC: 9.5 10*3/uL (ref 4.0–10.5)
nRBC: 0 % (ref 0.0–0.2)

## 2023-01-13 LAB — CBG MONITORING, ED: Glucose-Capillary: 212 mg/dL — ABNORMAL HIGH (ref 70–99)

## 2023-01-13 LAB — BASIC METABOLIC PANEL
Anion gap: 12 (ref 5–15)
BUN: 37 mg/dL — ABNORMAL HIGH (ref 8–23)
CO2: 28 mmol/L (ref 22–32)
Calcium: 8.8 mg/dL — ABNORMAL LOW (ref 8.9–10.3)
Chloride: 97 mmol/L — ABNORMAL LOW (ref 98–111)
Creatinine, Ser: 0.72 mg/dL (ref 0.44–1.00)
GFR, Estimated: >60 mL/min (ref >60)
Glucose, Bld: 229 mg/dL — ABNORMAL HIGH (ref 70–99)
Potassium: 3.4 mmol/L — ABNORMAL LOW (ref 3.5–5.1)
Sodium: 137 mmol/L (ref 135–145)

## 2023-01-13 LAB — TROPONIN I (HIGH SENSITIVITY)
Troponin I (High Sensitivity): 8 ng/L (ref <18)
Troponin I (High Sensitivity): 8 ng/L (ref <18)

## 2023-01-13 NOTE — Discharge Instructions (Signed)
Return for any recurrent passing out.  Make an appointment follow-up with your primary care doctor.  Make sure you hydrate yourself well.  Today's workup without any acute findings.

## 2023-01-13 NOTE — ED Provider Notes (Signed)
Did initial evaluation of the patient.  72 year old female presented after a syncopal episode while she was walking to her front door.  Does report that she fully lost consciousness and had a head strike.  This was preceded by dizziness but not chest pain or shortness of breath.  Says that she is hurting in the back of her head but denies additional pain.  Blood pressure for EMS was low but here has been normotensive.  Mentating well.  No obvious additional injuries.  Preliminary orders have been placed.  Please see note by ED provider for additional details and full evaluation.   Fransico Meadow, MD 01/13/23 [phone]

## 2023-01-13 NOTE — ED Provider Notes (Signed)
[facility] Provider Note   CSN: [id number] Arrival date & time: 01/13/23  1353     History  Chief Complaint  Patient presents with   Loss of Consciousness    Miranda Sampson is a 72 y.o. female.  Patient brought in by EMS.  Patient was going to the front door to answer the doorbell when a neighbor was at the front door.  She lives alone.  She fell may have had a syncopal episode.  Patient remembers feeling as if her legs were very weak.  Patient hit her head on the concrete.  Patient not on blood thinners.  EMS said that there was orthostatic hypotension her blood pressures were 84/50 blood sugar was 235.  Here blood pressures have been good and patient is been able to ambulate to the bathroom.  Patient denies any chest pain any shortness of breath any abdominal pain any hip pain or any upper or lower extremity pain no back pain.  Patient without any complaints currently.  She did have a c-collar placed by EMS.  Past medical history significant for diabetes history of substance abuse hyperlipidemia and hypertension.  Patient states she never used tobacco products.       Home Medications Prior to Admission medications   Medication Sig Start Date End Date Taking? Authorizing Provider  albuterol (VENTOLIN HFA) 108 (90 Base) MCG/ACT inhaler Inhale 2 puffs into the lungs every 6 (six) hours as needed for wheezing or shortness of breath.   Yes [provider]  atorvastatin (LIPITOR) 20 MG tablet Take 20 mg by mouth daily. 10/05/22  Yes [provider]  gabapentin (NEURONTIN) 100 MG capsule Take 100 mg by mouth at bedtime. 10/05/22  Yes [provider]  ibuprofen (ADVIL,MOTRIN) 200 MG tablet Take 1 tablet (200 mg total) by mouth 2 (two) times daily. 06/09/16  Yes , Modena Nunnery, MD  LANTUS SOLOSTAR 100 UNIT/ML Solostar Pen Inject 40 Units into the skin at bedtime. 10/24/22  Yes [provider]  TRADJENTA 5 MG  TABS tablet Take 5 mg by mouth daily. 10/05/22  Yes [provider]  glucose blood (ACCU-CHEK AVIVA) test strip Use as instructed to monitor FSBS 2x daily. Dx: E11.9 01/09/17   Alycia Rossetti, MD      Allergies    Patient has no allergy information on record.    Review of Systems   Review of Systems  Constitutional:  Positive for fatigue. Negative for chills and fever.  HENT:  Negative for ear pain and sore throat.   Eyes:  Negative for pain and visual disturbance.  Respiratory:  Negative for cough and shortness of breath.   Cardiovascular:  Negative for chest pain and palpitations.  Gastrointestinal:  Negative for abdominal pain and vomiting.  Genitourinary:  Negative for dysuria and hematuria.  Musculoskeletal:  Negative for arthralgias and back pain.  Skin:  Negative for color change and rash.  Neurological:  Negative for seizures and syncope.  All other systems reviewed and are negative.   Physical Exam Updated Vital Signs BP 137/75   Pulse 87   Temp 98.2 F (36.8 C) (Oral)   Resp 20   SpO2 95%  Physical Exam Vitals and nursing note reviewed.  Constitutional:      General: She is not in acute distress.    Appearance: Normal appearance. She is well-developed.  HENT:     Head: Normocephalic and atraumatic.  Eyes:     Extraocular Movements: Extraocular  movements intact.     Conjunctiva/sclera: Conjunctivae normal.     Pupils: Pupils are equal, round, and reactive to light.  Cardiovascular:     Rate and Rhythm: Normal rate and regular rhythm.     Heart sounds: No murmur heard. Pulmonary:     Effort: Pulmonary effort is normal. No respiratory distress.     Breath sounds: Normal breath sounds.  Abdominal:     Palpations: Abdomen is soft.     Tenderness: There is no abdominal tenderness.  Musculoskeletal:        General: No swelling.     Cervical back: Normal range of motion and neck supple. No rigidity.  Skin:    General: Skin is warm and dry.      Capillary Refill: Capillary refill takes less than 2 seconds.  Neurological:     General: No focal deficit present.     Mental Status: She is alert and oriented to person, place, and time.     Cranial Nerves: No cranial nerve deficit.     Sensory: No sensory deficit.     Motor: No weakness.  Psychiatric:        Mood and Affect: Mood normal.     ED Results / Procedures / Treatments   Labs (all labs ordered are listed, but only abnormal results are displayed) Labs Reviewed  BASIC METABOLIC PANEL - Abnormal; Notable for the following components:      Result Value   Potassium 3.4 (*)    Chloride 97 (*)    Glucose, Bld 229 (*)    BUN 37 (*)    Calcium 8.8 (*)    All other components within normal limits  CBC WITH DIFFERENTIAL/PLATELET - Abnormal; Notable for the following components:   Hemoglobin 15.6 (*)    HCT 48.1 (*)    MCV 102.8 (*)    All other components within normal limits  CBG MONITORING, ED - Abnormal; Notable for the following components:   Glucose-Capillary 212 (*)    All other components within normal limits  CBG MONITORING, ED  TROPONIN I (HIGH SENSITIVITY)  TROPONIN I (HIGH SENSITIVITY)    EKG None  Radiology CT Head Wo Contrast  Result Date: 01/13/2023 CLINICAL DATA:  Syncopal episode EXAM: CT HEAD WITHOUT CONTRAST CT CERVICAL SPINE WITHOUT CONTRAST TECHNIQUE: Multidetector CT imaging of the head and cervical spine was performed following the standard protocol without intravenous contrast. Multiplanar CT image reconstructions of the cervical spine were also generated. RADIATION DOSE REDUCTION: This exam was performed according to the departmental dose-optimization program which includes automated exposure control, adjustment of the mA and/or kV according to patient size and/or use of iterative reconstruction technique. COMPARISON:  03/23/2022 CT head and cervical spine FINDINGS: CT HEAD FINDINGS Brain: No evidence of acute infarct, hemorrhage, mass, mass effect,  or midline shift. No hydrocephalus or extra-axial fluid collection. Periventricular white matter changes, likely the sequela of chronic small vessel ischemic disease. Mildly advanced cerebral volume loss for age. Vascular: No hyperdense vessel. Atherosclerotic calcifications in the intracranial carotid and vertebral arteries. Skull: Negative for fracture or focal lesion. Right frontotemporal scalp hematoma. Sinuses/Orbits: No acute finding. Other: The mastoid air cells are well aerated. CT CERVICAL SPINE FINDINGS Alignment: 3 mm retrolisthesis of C3 on C4, unchanged. No traumatic listhesis. Skull base and vertebrae: No acute fracture. No primary bone lesion or focal pathologic process. Soft tissues and spinal canal: No prevertebral fluid or swelling. No visible canal hematoma. Disc levels: Multilevel degenerative changes, with disc height loss  greatest at C3-C4, where there is moderate spinal canal stenosis. Multilevel facet and uncovertebral hypertrophy results in severe neural foraminal narrowing bilaterally at C3-C4 and on the right at C6-C7. Upper chest: Emphysema. No focal pulmonary opacity or pleural effusion. Other: None. IMPRESSION: 1. No acute intracranial process. Right frontotemporal scalp hematoma. 2. No acute fracture or traumatic listhesis in the cervical spine. Electronically Signed   By: Merilyn Baba M.D.   On: 01/13/2023 16:06   CT Cervical Spine Wo Contrast  Result Date: 01/13/2023 CLINICAL DATA:  Syncopal episode EXAM: CT HEAD WITHOUT CONTRAST CT CERVICAL SPINE WITHOUT CONTRAST TECHNIQUE: Multidetector CT imaging of the head and cervical spine was performed following the standard protocol without intravenous contrast. Multiplanar CT image reconstructions of the cervical spine were also generated. RADIATION DOSE REDUCTION: This exam was performed according to the departmental dose-optimization program which includes automated exposure control, adjustment of the mA and/or kV according to patient  size and/or use of iterative reconstruction technique. COMPARISON:  03/23/2022 CT head and cervical spine FINDINGS: CT HEAD FINDINGS Brain: No evidence of acute infarct, hemorrhage, mass, mass effect, or midline shift. No hydrocephalus or extra-axial fluid collection. Periventricular white matter changes, likely the sequela of chronic small vessel ischemic disease. Mildly advanced cerebral volume loss for age. Vascular: No hyperdense vessel. Atherosclerotic calcifications in the intracranial carotid and vertebral arteries. Skull: Negative for fracture or focal lesion. Right frontotemporal scalp hematoma. Sinuses/Orbits: No acute finding. Other: The mastoid air cells are well aerated. CT CERVICAL SPINE FINDINGS Alignment: 3 mm retrolisthesis of C3 on C4, unchanged. No traumatic listhesis. Skull base and vertebrae: No acute fracture. No primary bone lesion or focal pathologic process. Soft tissues and spinal canal: No prevertebral fluid or swelling. No visible canal hematoma. Disc levels: Multilevel degenerative changes, with disc height loss greatest at C3-C4, where there is moderate spinal canal stenosis. Multilevel facet and uncovertebral hypertrophy results in severe neural foraminal narrowing bilaterally at C3-C4 and on the right at C6-C7. Upper chest: Emphysema. No focal pulmonary opacity or pleural effusion. Other: None. IMPRESSION: 1. No acute intracranial process. Right frontotemporal scalp hematoma. 2. No acute fracture or traumatic listhesis in the cervical spine. Electronically Signed   By: Merilyn Baba M.D.   On: 01/13/2023 16:06   DG Chest Portable 1 View  Result Date: 01/13/2023 CLINICAL DATA:  Syncope EXAM: PORTABLE CHEST 1 VIEW COMPARISON:  11/16/2022 chest radiograph. FINDINGS: Stable cardiomediastinal silhouette with top-normal heart size. No pneumothorax. No pleural effusion. Emphysema. Coarse reticular opacities throughout both lungs, similar. No superimposed acute consolidative airspace  disease. IMPRESSION: Emphysema. Chronic coarse reticular opacities throughout both lungs, similar, favor chronic interstitial lung disease. No superimposed acute consolidative airspace disease to suggest a pneumonia. Electronically Signed   By: Ilona Sorrel M.D.   On: 01/13/2023 14:56    Procedures Procedures    Medications Ordered in ED Medications - No data to display  ED Course/ Medical Decision Making/ A&P                             Medical Decision Making  Patient able to ambulate here without any difficulties.  Extensive cardiac monitoring without any arrhythmias.  Patient's basic metabolic panel potassium 3.4 glucose 229 renal function normal CBC no leukocytosis hemoglobin a little hemoconcentrated at 15.6.  Platelets are normal.  Troponins x 2 were 8.  Head CT without any acute findings.  CT cervical spine without any acute findings.  Chest x-ray without  any acute findings.  Other than the emphysema.  No evidence of any airspace disease to suggest pneumonia.  Patient not in any respiratory distress.  I think the fact the patient is ambulating here fine wanting to eat and drink that she is stable for discharge home and follow-up with her primary care doctor.  There was no evidence of any arrhythmias here.  And blood pressures have been fine. Final Clinical Impression(s) / ED Diagnoses Final diagnoses:  Fall, initial encounter  Syncope, unspecified syncope type    Rx / DC Orders ED Discharge Orders     None         Fredia Sorrow, MD 01/13/23 2024

## 2023-01-13 NOTE — ED Notes (Signed)
Patient denies having a pacemaker. 

## 2023-01-13 NOTE — ED Triage Notes (Signed)
Pt BIB [facility] from home, lives alone, pt had syncopal episode while with neighbor. Pt hit head on concrete, is not on blood thinners.  EMS endorses orthostatic hypotension. 84/50 on scene. CBG 235.   20 g L forearm. 500 ml NS bolus.

## 2023-01-13 NOTE — ED Notes (Signed)
Patient calling stating that she needs to eat that she has been here all day. Explained to patient that per Dr Order that she is not able to have anything to eat until he sees her.  Patient states that she wants to leave at this time.

## 2023-01-14 LAB — QUANTIFERON-TB GOLD PLUS (RQFGPL)
QuantiFERON Mitogen Value: 0.63 IU/mL
QuantiFERON Nil Value: 0 IU/mL
QuantiFERON TB1 Ag Value: 0 IU/mL
QuantiFERON TB2 Ag Value: 0 IU/mL

## 2023-01-14 LAB — QUANTIFERON-TB GOLD PLUS: QuantiFERON-TB Gold Plus: NEGATIVE

## 2023-01-17 ENCOUNTER — Ambulatory Visit (HOSPITAL_COMMUNITY)
Admission: RE | Admit: 2023-01-17 | Discharge: 2023-01-17 | Disposition: A | Discharge status: Home / Self Care | Payer: 59 | Source: Ambulatory Visit | Attending: Internal Medicine | Admitting: Internal Medicine

## 2023-01-17 DIAGNOSIS — R0609 Other forms of dyspnea: Secondary | ICD-10-CM | POA: Insufficient documentation

## 2023-01-17 LAB — ECHOCARDIOGRAM COMPLETE
AR max vel: 1.86 cm2
AV Area VTI: 1.56 cm2
AV Area mean vel: 1.65 cm2
AV Mean grad: 4 mmHg
AV Peak grad: 6.6 mmHg
Ao pk vel: 1.28 m/s
Area-P 1/2: 2.24 cm2
S' Lateral: 2 cm

## 2023-01-17 NOTE — Progress Notes (Signed)
*  PRELIMINARY RESULTS* Echocardiogram 2D Echocardiogram has been performed.  Miranda Sampson 01/17/2023, 2:53 PM

## 2023-01-18 LAB — COCAINE,MS,WB/SP RFX
Benzoylecgonine: 278 ng/mL
Cocaine Confirmation: POSITIVE
Cocaine: NEGATIVE ng/mL

## 2023-01-18 LAB — DRUG SCREEN 10 W/CONF, SERUM
Amphetamines, IA: NEGATIVE ng/mL
Barbiturates, IA: NEGATIVE ug/mL
Benzodiazepines, IA: NEGATIVE ng/mL
Cocaine & Metabolite, IA: POSITIVE ng/mL — AB
Methadone, IA: NEGATIVE ng/mL
Opiates, IA: NEGATIVE ng/mL
Oxycodones, IA: NEGATIVE ng/mL
Phencyclidine, IA: NEGATIVE ng/mL
Propoxyphene, IA: NEGATIVE ng/mL
THC(Marijuana) Metabolite, IA: NEGATIVE ng/mL

## 2023-01-31 ENCOUNTER — Other Ambulatory Visit: Payer: Medicare Other

## 2023-02-15 ENCOUNTER — Inpatient Hospital Stay: Admission: RE | Admit: 2023-02-15 | Payer: 59 | Source: Ambulatory Visit

## 2023-02-28 ENCOUNTER — Ambulatory Visit (HOSPITAL_COMMUNITY)
Admission: RE | Admit: 2023-02-28 | Discharge: 2023-02-28 | Disposition: A | Discharge status: Home / Self Care | Payer: 59 | Source: Ambulatory Visit | Attending: Internal Medicine | Admitting: Internal Medicine

## 2023-02-28 DIAGNOSIS — J849 Interstitial pulmonary disease, unspecified: Secondary | ICD-10-CM | POA: Insufficient documentation

## 2023-02-28 LAB — PULMONARY FUNCTION TEST
DL/VA % pred: 66 %
DL/VA: 2.62 ml/min/mmHg/L
DLCO unc % pred: 32 %
DLCO unc: 7.87 ml/min/mmHg
FEF 25-75 Post: 1.22 L/sec
FEF 25-75 Pre: 1.22 L/sec
FEF2575-%Change-Post: 0 %
FEF2575-%Pred-Post: 55 %
FEF2575-%Pred-Pre: 55 %
FEV1-%Change-Post: -2 %
FEV1-%Pred-Post: 53 %
FEV1-%Pred-Pre: 54 %
FEV1-Post: 1.54 L
FEV1-Pre: 1.58 L
FEV1FVC-%Change-Post: -4 %
FEV1FVC-%Pred-Pre: 99 %
FEV6-%Change-Post: 1 %
FEV6-%Pred-Post: 58 %
FEV6-%Pred-Pre: 57 %
FEV6-Post: 2.12 L
FEV6-Pre: 2.1 L
FEV6FVC-%Change-Post: -1 %
FEV6FVC-%Pred-Post: 103 %
FEV6FVC-%Pred-Pre: 104 %
FVC-%Change-Post: 2 %
FVC-%Pred-Post: 56 %
FVC-%Pred-Pre: 55 %
FVC-Post: 2.15 L
FVC-Pre: 2.1 L
Post FEV1/FVC ratio: 72 %
Post FEV6/FVC ratio: 99 %
Pre FEV1/FVC ratio: 75 %
Pre FEV6/FVC Ratio: 100 %
RV % pred: 52 %
RV: 1.37 L
TLC % pred: 56 %
TLC: 3.46 L

## 2023-02-28 MED ORDER — ALBUTEROL SULFATE (2.5 MG/3ML) 0.083% IN NEBU
2.5000 mg | INHALATION_SOLUTION | Freq: Once | RESPIRATORY_TRACT | Status: AC
  Administered 2023-02-28: 2.5 mg via RESPIRATORY_TRACT

## 2023-03-13 ENCOUNTER — Ambulatory Visit
Admission: RE | Admit: 2023-03-13 | Discharge: 2023-03-13 | Disposition: A | Discharge status: Home / Self Care | Payer: 59 | Source: Ambulatory Visit | Attending: Internal Medicine | Admitting: Internal Medicine

## 2023-03-13 DIAGNOSIS — J849 Interstitial pulmonary disease, unspecified: Secondary | ICD-10-CM

## 2023-03-22 ENCOUNTER — Telehealth: Payer: Self-pay | Admitting: Internal Medicine

## 2023-03-22 DIAGNOSIS — R931 Abnormal findings on diagnostic imaging of heart and coronary circulation: Secondary | ICD-10-CM

## 2023-03-22 NOTE — Telephone Encounter (Signed)
Spoke to pt and informed her of Dr Jane Canary finding on CT scan. Pt verbalized understanding. Nothing further needed.

## 2023-03-22 NOTE — Telephone Encounter (Signed)
    CT chest shows ILD and  there PFT suggests moderate severity   Plan  - pls ensure she does her seroplogies atleast 5 days before her ov with me on 04/03/23      Latest Ref Rng & Units 02/28/2023    1:47 PM  PFT Results  FVC-Pre L 2.10   FVC-Predicted Pre % 55   FVC-Post L 2.15   FVC-Predicted Post % 56   Pre FEV1/FVC % % 75   Post FEV1/FCV % % 72   FEV1-Pre L 1.58   FEV1-Predicted Pre % 54   FEV1-Post L 1.54   DLCO uncorrected ml/min/mmHg 7.87   DLCO UNC% % 32   DLVA Predicted % 66   TLC L 3.46   TLC % Predicted % 56   RV % Predicted % 52

## 2023-03-22 NOTE — Telephone Encounter (Signed)
Miranda Sampson  Forgot this - echo is abnormal  Plan  - refer cardiology - [facility] or [facility] - whoever can see sooner - I can explain tp her more when I see her      IMPRESSIONS     1. Severely thickened speckled like appearing myocardium, thickened heart  valves, diastolic dysfunction. Consider possible cardiac amyloidosis,  consider cMRI for better evaluation. . Left ventricular ejection fraction,  by estimation, is 60 to 65%. The  left ventricle has normal function. The left ventricle has no regional  wall motion abnormalities. There is moderate left ventricular hypertrophy.  Left ventricular diastolic parameters are consistent with Grade II  diastolic dysfunction  (pseudonormalization). Elevated left atrial pressure.   2. Right ventricular systolic function is normal. The right ventricular  size is normal. There is mildly elevated pulmonary artery systolic  pressure.   3. Left atrial size was mildly dilated.   4. Right atrial size was mildly dilated.   5. The mitral valve is abnormal. No evidence of mitral valve  regurgitation. Mild mitral stenosis. Moderate mitral annular  calcification.   6. The tricuspid valve is abnormal. Tricuspid valve regurgitation is mild  to moderate.   7. The aortic valve is tricuspid. There is mild calcification of the  aortic valve. There is mild thickening of the aortic valve. Aortic valve  regurgitation is not visualized. No aortic stenosis is present.   8. The inferior vena cava is dilated in size with >50% respiratory  variability, suggesting right atrial pressure of 8 mmHg.

## 2023-03-29 ENCOUNTER — Inpatient Hospital Stay (HOSPITAL_COMMUNITY)
Admission: EM | Admit: 2023-03-29 | Discharge: 2023-04-05 | DRG: 481 | Disposition: A | Discharge status: Skilled Nursing Facility | Payer: 59 | Attending: Family Medicine | Admitting: Family Medicine

## 2023-03-29 ENCOUNTER — Other Ambulatory Visit: Payer: Self-pay

## 2023-03-29 ENCOUNTER — Emergency Department (HOSPITAL_COMMUNITY): Payer: 59

## 2023-03-29 ENCOUNTER — Encounter (HOSPITAL_COMMUNITY): Payer: Self-pay | Admitting: Family Medicine

## 2023-03-29 DIAGNOSIS — D72829 Elevated white blood cell count, unspecified: Secondary | ICD-10-CM | POA: Diagnosis present

## 2023-03-29 DIAGNOSIS — R9431 Abnormal electrocardiogram [ECG] [EKG]: Secondary | ICD-10-CM | POA: Diagnosis not present

## 2023-03-29 DIAGNOSIS — Z681 Body mass index (BMI) 19 or less, adult: Secondary | ICD-10-CM | POA: Diagnosis not present

## 2023-03-29 DIAGNOSIS — E1165 Type 2 diabetes mellitus with hyperglycemia: Secondary | ICD-10-CM | POA: Diagnosis present

## 2023-03-29 DIAGNOSIS — I9589 Other hypotension: Secondary | ICD-10-CM | POA: Diagnosis present

## 2023-03-29 DIAGNOSIS — F32A Depression, unspecified: Secondary | ICD-10-CM | POA: Diagnosis present

## 2023-03-29 DIAGNOSIS — F1910 Other psychoactive substance abuse, uncomplicated: Secondary | ICD-10-CM | POA: Diagnosis not present

## 2023-03-29 DIAGNOSIS — Z794 Long term (current) use of insulin: Secondary | ICD-10-CM | POA: Diagnosis not present

## 2023-03-29 DIAGNOSIS — Z79899 Other long term (current) drug therapy: Secondary | ICD-10-CM

## 2023-03-29 DIAGNOSIS — Z28310 Unvaccinated for covid-19: Secondary | ICD-10-CM | POA: Diagnosis not present

## 2023-03-29 DIAGNOSIS — F1410 Cocaine abuse, uncomplicated: Secondary | ICD-10-CM | POA: Diagnosis present

## 2023-03-29 DIAGNOSIS — I4719 Other supraventricular tachycardia: Secondary | ICD-10-CM

## 2023-03-29 DIAGNOSIS — I959 Hypotension, unspecified: Secondary | ICD-10-CM | POA: Diagnosis present

## 2023-03-29 DIAGNOSIS — S72401A Unspecified fracture of lower end of right femur, initial encounter for closed fracture: Secondary | ICD-10-CM | POA: Diagnosis not present

## 2023-03-29 DIAGNOSIS — Z7984 Long term (current) use of oral hypoglycemic drugs: Secondary | ICD-10-CM

## 2023-03-29 DIAGNOSIS — Y92019 Unspecified place in single-family (private) house as the place of occurrence of the external cause: Secondary | ICD-10-CM | POA: Diagnosis not present

## 2023-03-29 DIAGNOSIS — E871 Hypo-osmolality and hyponatremia: Secondary | ICD-10-CM | POA: Diagnosis present

## 2023-03-29 DIAGNOSIS — E785 Hyperlipidemia, unspecified: Secondary | ICD-10-CM | POA: Diagnosis present

## 2023-03-29 DIAGNOSIS — F1490 Cocaine use, unspecified, uncomplicated: Secondary | ICD-10-CM

## 2023-03-29 DIAGNOSIS — I10 Essential (primary) hypertension: Secondary | ICD-10-CM | POA: Diagnosis present

## 2023-03-29 DIAGNOSIS — R931 Abnormal findings on diagnostic imaging of heart and coronary circulation: Secondary | ICD-10-CM | POA: Diagnosis present

## 2023-03-29 DIAGNOSIS — Z833 Family history of diabetes mellitus: Secondary | ICD-10-CM | POA: Diagnosis not present

## 2023-03-29 DIAGNOSIS — R410 Disorientation, unspecified: Secondary | ICD-10-CM | POA: Diagnosis not present

## 2023-03-29 DIAGNOSIS — I480 Paroxysmal atrial fibrillation: Secondary | ICD-10-CM | POA: Diagnosis present

## 2023-03-29 DIAGNOSIS — I471 Supraventricular tachycardia, unspecified: Secondary | ICD-10-CM | POA: Diagnosis not present

## 2023-03-29 DIAGNOSIS — Z8249 Family history of ischemic heart disease and other diseases of the circulatory system: Secondary | ICD-10-CM | POA: Diagnosis not present

## 2023-03-29 DIAGNOSIS — S72351A Displaced comminuted fracture of shaft of right femur, initial encounter for closed fracture: Principal | ICD-10-CM | POA: Diagnosis present

## 2023-03-29 DIAGNOSIS — R64 Cachexia: Secondary | ICD-10-CM | POA: Diagnosis present

## 2023-03-29 DIAGNOSIS — Y9301 Activity, walking, marching and hiking: Secondary | ICD-10-CM | POA: Diagnosis present

## 2023-03-29 DIAGNOSIS — K219 Gastro-esophageal reflux disease without esophagitis: Secondary | ICD-10-CM | POA: Diagnosis present

## 2023-03-29 DIAGNOSIS — E861 Hypovolemia: Secondary | ICD-10-CM | POA: Diagnosis present

## 2023-03-29 DIAGNOSIS — Z806 Family history of leukemia: Secondary | ICD-10-CM | POA: Diagnosis not present

## 2023-03-29 DIAGNOSIS — E1065 Type 1 diabetes mellitus with hyperglycemia: Secondary | ICD-10-CM | POA: Diagnosis not present

## 2023-03-29 DIAGNOSIS — E119 Type 2 diabetes mellitus without complications: Secondary | ICD-10-CM | POA: Diagnosis not present

## 2023-03-29 DIAGNOSIS — W109XXA Fall (on) (from) unspecified stairs and steps, initial encounter: Secondary | ICD-10-CM | POA: Diagnosis present

## 2023-03-29 DIAGNOSIS — J849 Interstitial pulmonary disease, unspecified: Secondary | ICD-10-CM | POA: Diagnosis present

## 2023-03-29 DIAGNOSIS — Z01810 Encounter for preprocedural cardiovascular examination: Secondary | ICD-10-CM | POA: Diagnosis not present

## 2023-03-29 LAB — COMPREHENSIVE METABOLIC PANEL
ALT: 13 U/L (ref 0–44)
AST: 16 U/L (ref 15–41)
Albumin: 3.2 g/dL — ABNORMAL LOW (ref 3.5–5.0)
Alkaline Phosphatase: 76 U/L (ref 38–126)
Anion gap: 10 (ref 5–15)
BUN: 23 mg/dL (ref 8–23)
CO2: 25 mmol/L (ref 22–32)
Calcium: 9.1 mg/dL (ref 8.9–10.3)
Chloride: 97 mmol/L — ABNORMAL LOW (ref 98–111)
Creatinine, Ser: 0.84 mg/dL (ref 0.44–1.00)
GFR, Estimated: >60 mL/min (ref >60)
Glucose, Bld: 386 mg/dL — ABNORMAL HIGH (ref 70–99)
Potassium: 4.3 mmol/L (ref 3.5–5.1)
Sodium: 132 mmol/L — ABNORMAL LOW (ref 135–145)
Total Bilirubin: 1.2 mg/dL (ref 0.3–1.2)
Total Protein: 7.7 g/dL (ref 6.5–8.1)

## 2023-03-29 LAB — CBC WITH DIFFERENTIAL/PLATELET
Abs Immature Granulocytes: 0.11 10*3/uL — ABNORMAL HIGH (ref 0.00–0.07)
Abs Immature Granulocytes: 0.07 10*3/uL (ref 0.00–0.07)
Basophils Absolute: 0.1 10*3/uL (ref 0.0–0.1)
Basophils Absolute: 0 10*3/uL (ref 0.0–0.1)
Basophils Relative: 0 %
Basophils Relative: 0 %
Eosinophils Absolute: 0.1 10*3/uL (ref 0.0–0.5)
Eosinophils Absolute: 0 10*3/uL (ref 0.0–0.5)
Eosinophils Relative: 0 %
Eosinophils Relative: 0 %
HCT: 40.5 % (ref 36.0–46.0)
HCT: 39.4 % (ref 36.0–46.0)
Hemoglobin: 13.9 g/dL (ref 12.0–15.0)
Hemoglobin: 13.2 g/dL (ref 12.0–15.0)
Immature Granulocytes: 1 %
Immature Granulocytes: 1 %
Lymphocytes Relative: 6 %
Lymphocytes Relative: 7 %
Lymphs Abs: 1.3 10*3/uL (ref 0.7–4.0)
Lymphs Abs: 1 10*3/uL (ref 0.7–4.0)
MCH: 33.7 pg (ref 26.0–34.0)
MCH: 33.2 pg (ref 26.0–34.0)
MCHC: 34.3 g/dL (ref 30.0–36.0)
MCHC: 33.5 g/dL (ref 30.0–36.0)
MCV: 98.1 fL (ref 80.0–100.0)
MCV: 99 fL (ref 80.0–100.0)
Monocytes Absolute: 1.1 10*3/uL — ABNORMAL HIGH (ref 0.1–1.0)
Monocytes Absolute: 1.2 10*3/uL — ABNORMAL HIGH (ref 0.1–1.0)
Monocytes Relative: 5 %
Monocytes Relative: 9 %
Neutro Abs: 17.5 10*3/uL — ABNORMAL HIGH (ref 1.7–7.7)
Neutro Abs: 11.6 10*3/uL — ABNORMAL HIGH (ref 1.7–7.7)
Neutrophils Relative %: 88 %
Neutrophils Relative %: 83 %
Platelets: 321 10*3/uL (ref 150–400)
Platelets: 258 10*3/uL (ref 150–400)
RBC: 4.13 MIL/uL (ref 3.87–5.11)
RBC: 3.98 MIL/uL (ref 3.87–5.11)
RDW: 13.1 % (ref 11.5–15.5)
RDW: 13.2 % (ref 11.5–15.5)
WBC: 20.1 10*3/uL — ABNORMAL HIGH (ref 4.0–10.5)
WBC: 13.8 10*3/uL — ABNORMAL HIGH (ref 4.0–10.5)
nRBC: 0 % (ref 0.0–0.2)
nRBC: 0 % (ref 0.0–0.2)

## 2023-03-29 LAB — GLUCOSE, CAPILLARY
Glucose-Capillary: 370 mg/dL — ABNORMAL HIGH (ref 70–99)
Glucose-Capillary: 360 mg/dL — ABNORMAL HIGH (ref 70–99)
Glucose-Capillary: 397 mg/dL — ABNORMAL HIGH (ref 70–99)
Glucose-Capillary: 427 mg/dL — ABNORMAL HIGH (ref 70–99)
Glucose-Capillary: 202 mg/dL — ABNORMAL HIGH (ref 70–99)
Glucose-Capillary: 85 mg/dL (ref 70–99)

## 2023-03-29 LAB — BASIC METABOLIC PANEL
Anion gap: 11 (ref 5–15)
BUN: 22 mg/dL (ref 8–23)
CO2: 25 mmol/L (ref 22–32)
Calcium: 9.2 mg/dL (ref 8.9–10.3)
Chloride: 95 mmol/L — ABNORMAL LOW (ref 98–111)
Creatinine, Ser: 0.99 mg/dL (ref 0.44–1.00)
GFR, Estimated: >60 mL/min (ref >60)
Glucose, Bld: 412 mg/dL — ABNORMAL HIGH (ref 70–99)
Potassium: 4.1 mmol/L (ref 3.5–5.1)
Sodium: 131 mmol/L — ABNORMAL LOW (ref 135–145)

## 2023-03-29 LAB — SURGICAL PCR SCREEN
MRSA, PCR: NEGATIVE
Staphylococcus aureus: NEGATIVE

## 2023-03-29 LAB — PROTIME-INR
INR: 1.1 (ref 0.8–1.2)
Prothrombin Time: 14.3 seconds (ref 11.4–15.2)

## 2023-03-29 LAB — TYPE AND SCREEN
ABO/RH(D): O POS
Antibody Screen: NEGATIVE

## 2023-03-29 LAB — TROPONIN I (HIGH SENSITIVITY)
Troponin I (High Sensitivity): 6 ng/L (ref <18)
Troponin I (High Sensitivity): 6 ng/L (ref <18)

## 2023-03-29 LAB — MAGNESIUM: Magnesium: 1.4 mg/dL — ABNORMAL LOW (ref 1.7–2.4)

## 2023-03-29 MED ORDER — INSULIN ASPART 100 UNIT/ML IJ SOLN
0.0000 [IU] | Freq: Every day | INTRAMUSCULAR | Status: DC
  Administered 2023-03-30: 2 [IU] via SUBCUTANEOUS
  Administered 2023-03-31 – 2023-04-01 (×2): 5 [IU] via SUBCUTANEOUS
  Administered 2023-04-04: 2 [IU] via SUBCUTANEOUS

## 2023-03-29 MED ORDER — ADENOSINE 6 MG/2ML IV SOLN
INTRAVENOUS | Status: AC
  Administered 2023-03-29: 6 mg via INTRAVENOUS
  Filled 2023-03-29: qty 2

## 2023-03-29 MED ORDER — OXYCODONE HCL 5 MG PO TABS
5.0000 mg | ORAL_TABLET | ORAL | Status: DC | PRN
  Administered 2023-03-29 – 2023-04-02 (×16): 5 mg via ORAL
  Filled 2023-03-29 (×16): qty 1

## 2023-03-29 MED ORDER — ATORVASTATIN CALCIUM 10 MG PO TABS
20.0000 mg | ORAL_TABLET | Freq: Every day | ORAL | Status: DC
  Administered 2023-03-29 – 2023-04-05 (×6): 20 mg via ORAL
  Filled 2023-03-29 (×7): qty 2

## 2023-03-29 MED ORDER — TRANEXAMIC ACID-NACL 1000-0.7 MG/100ML-% IV SOLN
1000.0000 mg | INTRAVENOUS | Status: AC
  Filled 2023-03-29: qty 100

## 2023-03-29 MED ORDER — MORPHINE SULFATE (PF) 2 MG/ML IV SOLN
2.0000 mg | INTRAVENOUS | Status: DC | PRN
  Administered 2023-03-29 – 2023-04-05 (×15): 2 mg via INTRAVENOUS
  Filled 2023-03-29 (×17): qty 1

## 2023-03-29 MED ORDER — INSULIN GLARGINE-YFGN 100 UNIT/ML ~~LOC~~ SOLN
30.0000 [IU] | Freq: Every day | SUBCUTANEOUS | Status: DC
  Administered 2023-03-29: 30 [IU] via SUBCUTANEOUS
  Filled 2023-03-29 (×2): qty 0.3

## 2023-03-29 MED ORDER — LINAGLIPTIN 5 MG PO TABS
5.0000 mg | ORAL_TABLET | Freq: Every day | ORAL | Status: DC
  Administered 2023-03-29: 5 mg via ORAL
  Filled 2023-03-29: qty 1

## 2023-03-29 MED ORDER — CEFAZOLIN SODIUM-DEXTROSE 2-4 GM/100ML-% IV SOLN: 2.0000 g | INTRAVENOUS | Status: AC

## 2023-03-29 MED ORDER — INSULIN DETEMIR 100 UNIT/ML ~~LOC~~ SOLN
20.0000 [IU] | Freq: Every day | SUBCUTANEOUS | Status: DC
  Administered 2023-03-29: 20 [IU] via SUBCUTANEOUS
  Filled 2023-03-29: qty 0.2

## 2023-03-29 MED ORDER — ADENOSINE 6 MG/2ML IV SOLN: 6.0000 mg | Freq: Once | INTRAVENOUS | Status: AC

## 2023-03-29 MED ORDER — GABAPENTIN 100 MG PO CAPS
100.0000 mg | ORAL_CAPSULE | Freq: Every day | ORAL | Status: DC
  Administered 2023-03-29 – 2023-04-04 (×7): 100 mg via ORAL
  Filled 2023-03-29 (×7): qty 1

## 2023-03-29 MED ORDER — ACETAMINOPHEN 325 MG PO TABS
650.0000 mg | ORAL_TABLET | Freq: Four times a day (QID) | ORAL | Status: DC | PRN
  Administered 2023-04-03 – 2023-04-05 (×4): 650 mg via ORAL
  Filled 2023-03-29 (×4): qty 2

## 2023-03-29 MED ORDER — ONDANSETRON HCL 4 MG PO TABS: 4.0000 mg | ORAL_TABLET | Freq: Four times a day (QID) | ORAL | Status: DC | PRN

## 2023-03-29 MED ORDER — ONDANSETRON HCL 4 MG/2ML IJ SOLN: 4.0000 mg | Freq: Four times a day (QID) | INTRAMUSCULAR | Status: DC | PRN

## 2023-03-29 MED ORDER — SODIUM CHLORIDE 0.9 % IV BOLUS
1000.0000 mL | Freq: Once | INTRAVENOUS | Status: AC
  Administered 2023-03-29: 1000 mL via INTRAVENOUS

## 2023-03-29 MED ORDER — MAGNESIUM SULFATE 2 GM/50ML IV SOLN
2.0000 g | Freq: Once | INTRAVENOUS | Status: AC
  Administered 2023-03-29: 2 g via INTRAVENOUS
  Filled 2023-03-29: qty 50

## 2023-03-29 MED ORDER — ALBUTEROL SULFATE HFA 108 (90 BASE) MCG/ACT IN AERS: 2.0000 | INHALATION_SPRAY | Freq: Four times a day (QID) | RESPIRATORY_TRACT | Status: DC | PRN

## 2023-03-29 MED ORDER — ACETAMINOPHEN 650 MG RE SUPP: 650.0000 mg | Freq: Four times a day (QID) | RECTAL | Status: DC | PRN

## 2023-03-29 MED ORDER — FENTANYL CITRATE PF 50 MCG/ML IJ SOSY
50.0000 ug | PREFILLED_SYRINGE | Freq: Once | INTRAMUSCULAR | Status: AC
  Administered 2023-03-29: 50 ug via INTRAVENOUS
  Filled 2023-03-29: qty 1

## 2023-03-29 MED ORDER — INSULIN ASPART 100 UNIT/ML IJ SOLN
0.0000 [IU] | Freq: Three times a day (TID) | INTRAMUSCULAR | Status: DC
  Administered 2023-03-29: 5 [IU] via SUBCUTANEOUS
  Administered 2023-03-29: 15 [IU] via SUBCUTANEOUS
  Administered 2023-03-30 – 2023-03-31 (×2): 3 [IU] via SUBCUTANEOUS
  Administered 2023-04-01: 15 [IU] via SUBCUTANEOUS
  Administered 2023-04-01: 8 [IU] via SUBCUTANEOUS
  Administered 2023-04-01: 5 [IU] via SUBCUTANEOUS
  Administered 2023-04-02: 8 [IU] via SUBCUTANEOUS
  Administered 2023-04-02: 5 [IU] via SUBCUTANEOUS
  Administered 2023-04-03: 3 [IU] via SUBCUTANEOUS
  Administered 2023-04-04: 2 [IU] via SUBCUTANEOUS
  Administered 2023-04-05: 3 [IU] via SUBCUTANEOUS

## 2023-03-29 MED ORDER — INSULIN ASPART 100 UNIT/ML IJ SOLN
20.0000 [IU] | Freq: Once | INTRAMUSCULAR | Status: AC
  Administered 2023-03-29: 20 [IU] via SUBCUTANEOUS

## 2023-03-29 MED ORDER — ALBUTEROL SULFATE (2.5 MG/3ML) 0.083% IN NEBU: 2.5000 mg | INHALATION_SOLUTION | Freq: Four times a day (QID) | RESPIRATORY_TRACT | Status: DC | PRN

## 2023-03-29 NOTE — Assessment & Plan Note (Signed)
-   Initially hypotensive - Vasovagal versus bleeding of the fracture - Hemoglobin stable at 13.9 - Trend in the a.m. - 1 L fluid given and blood pressure normalized - Need to monitor

## 2023-03-29 NOTE — TOC Progression Note (Signed)
  Transition of Care [facility]) Screening Note   Patient Details  Name: Miranda Sampson Date of Birth: 03/28/1951   Transition of Care [facility]) CM/SW Contact:    Leitha Bleak, RN Phone Number: 03/29/2023, 3:00 PM  Patient will go to OR tomorrow. TOC following.  Transition of Care Department [facility]) has reviewed patient and no TOC needs have been identified at this time. We will continue to monitor patient advancement through interdisciplinary progression rounds. If new patient transition needs arise, please place a TOC consult.      Barriers to Discharge: Continued Medical Work up  Expected Discharge Plan and Services       Social Determinants of Health (SDOH) Interventions SDOH Screenings   Food Insecurity: No Food Insecurity (03/29/2023)  Housing: Low Risk  (03/29/2023)  Transportation Needs: No Transportation Needs (03/29/2023)  Utilities: Not At Risk (03/29/2023)  Tobacco Use: Low Risk  (03/29/2023)    Readmission Risk Interventions     No data to display

## 2023-03-29 NOTE — Progress Notes (Signed)
PROGRESS NOTE    [name]  WUJ:[id number] DOB: 1951/05/16 DOA: 03/29/2023 PCP: Benetta Spar, MD   Brief Narrative:    Miranda Sampson is a 72 y.o. female with medical history significant of allergies, depression, diabetes mellitus, GERD, hyperlipidemia, substance abuse, and more presents the ED with a chief complaint of fall.  She was admitted for displaced comminuted fracture of right femoral shaft.  Orthopedics to evaluate with plans for operation on Thursday.  Assessment & Plan:   Principal Problem:   Displaced comminuted fracture of shaft of right femur, initial encounter for closed fracture Active Problems:   Diabetes mellitus   Hyperlipidemia   Cocaine abuse   Hypotension   Leukocytosis  Assessment and Plan:  Displaced comminuted fracture of shaft of right femur, initial encounter for closed fracture - Mechanical trip and fall - X-ray shows comminuted displaced distal femoral shaft fracture - Ortho plans to operate on Thursday - Pain control - Continue to monitor   Leukocytosis-downtrending - White blood cell count 20.1 - No infectious symptoms - Possibly stress related?? - Procalcitonin pending - UA pending - Continue to monitor  Hypomagnesemia -Replete and reevaluate in a.m.   Hypotension-stabilized - Initially hypotensive - Vasovagal versus bleeding of the fracture - Hemoglobin stable at 13.9 - Trend in the a.m. - 1 L fluid given and blood pressure normalized - Need to monitor   Cocaine abuse - Counseled on importance of cessation - UDS ordered - TOC consult   Hyperlipidemia - Continue statin   Diabetes mellitus - Takes 40 units of basal insulin at baseline - Reduce to 20 units of basal insulin while in the hospital - Hyperglycemia at 412 - Sliding scale coverage - Continue to monitor   DVT prophylaxis: SCDs Code Status: Full Family Communication: None at bedside Disposition Plan:  Status is: Inpatient Remains inpatient  appropriate because: Need for IV medications and inpatient procedure.   Consultants:  Orthopedics  Procedures:  None  Antimicrobials:  Anti-infectives (From admission, onward)    Start     Dose/Rate Route Frequency Ordered Stop   03/30/23 1200  ceFAZolin (ANCEF) IVPB 2g/100 mL premix        2 g 200 mL/hr over 30 Minutes Intravenous On call to O.R. 03/29/23 0350 03/31/23 0559        Subjective: Patient seen and evaluated today with no new acute complaints or concerns. No acute concerns or events noted overnight.  She continues to have pain to her right knee.  Objective: Vitals:   03/29/23 0318 03/29/23 0400 03/29/23 0512 03/29/23 0915  BP: (!) 109/59 109/73 112/69 124/69  Pulse: 79  84 84  Resp: (!) Temp:   97.6 F (36.4 C)   TempSrc:   Oral Oral  SpO2: 98%  100% 95%  Weight:      Height:        Intake/Output Summary (Last 24 hours) at 03/29/2023 1108 Last data filed at 03/29/2023 0900 Gross per 24 hour  Intake 1240 ml  Output --  Net 1240 ml   Filed Weights   03/29/23 0219  Weight: 63.5 kg    Examination:  General exam: Appears calm and comfortable  Respiratory system: Clear to auscultation. Respiratory effort normal. Cardiovascular system: S1 & S2 heard, RRR.  Gastrointestinal system: Abdomen is soft Central nervous system: Alert and awake Extremities: Right knee in brace Skin: No significant lesions noted Psychiatry: Flat affect.    Data Reviewed: I have personally reviewed following  labs and imaging studies  CBC: Recent Labs  Lab 03/29/23 0230 03/29/23 0651  WBC 20.1* 13.8*  NEUTROABS 17.5* 11.6*  HGB 13.9 13.2  HCT 40.5 39.4  MCV 98.1 99.0  PLT 321 258   Basic Metabolic Panel: Recent Labs  Lab 03/29/23 0230 03/29/23 0651  NA 131* 132*  K 4.1 4.3  CL 95* 97*  CO2 25 25  GLUCOSE 412* 386*  BUN 22 23  CREATININE 0.99 0.84  CALCIUM 9.2 9.1  MG  --  1.4*   GFR: Estimated Creatinine Clearance: 59.8 mL/min (by C-G  formula based on SCr of 0.84 mg/dL). Liver Function Tests: Recent Labs  Lab 03/29/23 0651  AST 16  ALT 13  ALKPHOS 76  BILITOT 1.2  PROT 7.7  ALBUMIN 3.2*   No results for input(s): "LIPASE", "AMYLASE" in the last 168 hours. No results for input(s): "AMMONIA" in the last 168 hours. Coagulation Profile: Recent Labs  Lab 03/29/23 0230  INR 1.1   Cardiac Enzymes: No results for input(s): "CKTOTAL", "CKMB", "CKMBINDEX", "TROPONINI" in the last 168 hours. BNP (last 3 results) No results for input(s): "PROBNP" in the last 8760 hours. HbA1C: No results for input(s): "HGBA1C" in the last 72 hours. CBG: Recent Labs  Lab 03/29/23 0535 03/29/23 0733 03/29/23 1056  GLUCAP 370* 360* 397*   Lipid Profile: No results for input(s): "CHOL", "HDL", "LDLCALC", "TRIG", "CHOLHDL", "LDLDIRECT" in the last 72 hours. Thyroid Function Tests: No results for input(s): "TSH", "T4TOTAL", "FREET4", "T3FREE", "THYROIDAB" in the last 72 hours. Anemia Panel: No results for input(s): "VITAMINB12", "FOLATE", "FERRITIN", "TIBC", "IRON", "RETICCTPCT" in the last 72 hours. Sepsis Labs: No results for input(s): "PROCALCITON", "LATICACIDVEN" in the last 168 hours.  Recent Results (from the past 240 hour(s))  Surgical PCR screen     Status: None   Collection Time: 03/29/23  9:19 AM   Specimen: Nasal Mucosa; Nasal Swab  Result Value Ref Range Status   MRSA, PCR NEGATIVE NEGATIVE Final   Staphylococcus aureus NEGATIVE NEGATIVE Final    Comment: (NOTE) The Xpert SA Assay (FDA approved for NASAL specimens in patients 74 years of age and older), is one component of a comprehensive surveillance program. It is not intended to diagnose infection nor to guide or monitor treatment. Performed at [facility], [address], [place], [place] [address]          Radiology Studies: [facility] 1 View  Result Date: 03/29/2023 CLINICAL DATA:  Preop EXAM: PORTABLE CHEST 1 VIEW COMPARISON:  CT chest dated  03/13/2023 FINDINGS: Increased interstitial markings in the lungs bilaterally, lower lobe predominant, reflecting subpleural reticulation/fibrosis when correlating with prior CT. No superimposed opacity suspicious for pneumonia. No pleural effusion or pneumothorax. The heart is normal in size.  Thoracic aortic atherosclerosis. IMPRESSION: Chronic interstitial lung disease/fibrosis. No acute cardiopulmonary disease. Electronically Signed   By: Charline Bills M.D.   On: 03/29/2023 03:02   DG Femur Portable Min 2 Views Right  Result Date: 03/29/2023 CLINICAL DATA:  Fall, right femur deformity EXAM: RIGHT FEMUR PORTABLE 2 VIEW COMPARISON:  None Available. FINDINGS: Right hip and visualized bony pelvis are intact. Comminuted distal femoral shaft fracture. Knee is displaced 1 full shaft width medially relative to the distal femoral shaft. Mild apex posterior angulation. Associated soft tissue swelling with obvious deformity. IMPRESSION: Comminuted, displaced distal femoral shaft fracture, as described above. Electronically Signed   By: Charline Bills M.D.   On: 03/29/2023 03:01        Scheduled Meds:  atorvastatin  20 mg Oral Daily   gabapentin  100 mg Oral QHS   insulin aspart  0-15 Units Subcutaneous TID WC   insulin aspart  0-5 Units Subcutaneous QHS   insulin detemir  20 Units Subcutaneous QHS   Continuous Infusions:  [START ON 03/30/2023]  ceFAZolin (ANCEF) IV     magnesium sulfate bolus IVPB     [START ON 03/30/2023] tranexamic acid       LOS: 0 days    Time spent: 35 minutes    Tobias Avitabile Hoover Brunette, DO [facility]  If 7PM-7AM, please contact night-coverage [email or web address] 03/29/2023, 11:08 AM

## 2023-03-29 NOTE — Progress Notes (Signed)
   03/29/23 1737  Vitals  BP 93/60  MAP (mmHg) 72  BP Location Right Arm  BP Method Automatic  Patient Position (if appropriate) Sitting  Pulse Rate (!) 154  Resp (!) 21  Level of Consciousness  Level of Consciousness Alert  MEWS COLOR  MEWS Score Color Red  Oxygen Therapy  SpO2 98 %  O2 Device Room Air  Pain Assessment  Pain Scale 0-10  Pain Score 4  MEWS Score  MEWS Temp 0  MEWS Systolic 1  MEWS Pulse 3  MEWS RR 1  MEWS LOC 0  MEWS Score 5   Received call from central tele that pt has sustained HR 150-160. In to evaluate pt, pt sitting quietly in bed, A&O. Pt denies any complaints of SOB, chest pain or chest discomfort. Apical pulse verified > 140. Skin warm and dry, color appropriate. VS as above. Valsalva maneuver attempted with no change in rate or rhythm. EKG obtained, shows SVT 147-155. Pt now c/o nausea. MD Sherryll Burger notified by [facility] page. MD returned call and ordered Adenosine  IV now. Rapid response called and additional staff to bedside for support, including ICU nurse and [facility] Mike Gip, RN.  Adenosine  IV given push with immediate 20ml NS flush. Pt on continuous EKG tracing. Pause noted and pt heart rate converted to NSR in 80['s. Repeat b/p 128/75. Pt with no complaint of pain, SOB and now states that nausea has resolved. MD Sherryll Burger aware of response to med and current assessment.

## 2023-03-29 NOTE — Progress Notes (Signed)
Blood glucose per lab draw was 412, Dr. Carren Rang notified, no new orders at this time.

## 2023-03-29 NOTE — ED Notes (Signed)
ED Provider at bedside. 

## 2023-03-29 NOTE — Consult Note (Signed)
ORTHOPAEDIC CONSULTATION  REQUESTING PHYSICIAN: Erick Blinks, DO  ASSESSMENT AND PLAN: 72 y.o. female with the following: Comminuted right distal femur fracture   Orthopedics recommends admission to a medical service and we will provide consultation and follow along.  Based on the severity of the injury, I am recommending operative fixation in order to restore form and function.  This was discussed with the patient.  Will plan to proceed with surgery when she is medically optimized.  - Weight Bearing Status/Activity: Nonweightbearing  - Additional recommended labs/tests: None  -VTE Prophylaxis: As needed  - Pain control: As needed; judicious use of narcotics  - Follow-up plan: Approximately 2 weeks following surgery  -Procedures: Plan for retrograde femoral nail  DOS: 03/30/2023  Chief Complaint: Right leg pain  HPI: Miranda Sampson is a 72 y.o. female with past medical history as listed below, who presents to the emergency department with right leg pain.  She states that she was returning to her home, when she lost her balance on the stairs and fell backwards.  She twisted her right leg.  She had immediate pain.  She was able to drag herself inside.  She was on the ground for several hours, before family came to help her.  No pain elsewhere.  She did not hit her head.  She is currently in a knee immobilizer, and is comfortable when she does not move.  She denies numbness and tingling.  Past Medical History:  Diagnosis Date   Allergy    Arthritis    Depression    Diabetes mellitus    GERD (gastroesophageal reflux disease)    Hyperlipidemia    Hypertension    Substance abuse    Cocaine quit 2009   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  1996   ESOPHAGOGASTRODUODENOSCOPY N/A 03/20/2014   Procedure: ESOPHAGOGASTRODUODENOSCOPY (EGD);  Surgeon: Malissa Hippo, MD;  Location: [facility];  Service: Endoscopy;  Laterality: N/A;  140    TONSILLECTOMY     Social History   Socioeconomic History   Marital status: Widowed    Spouse name: Not on file   Number of children: Not on file   Years of education: Not on file   Highest education level: Not on file  Occupational History   Not on file  Tobacco Use   Smoking status: Never   Smokeless tobacco: Never  Substance and Sexual Activity   Alcohol use: Yes    Comment: socially   Drug use: No    Comment: former cocaine user (three years ago)   Sexual activity: Yes  Other Topics Concern   Not on file  Social History Narrative   Not on file   Social Determinants of Health   Financial Resource Strain: Not on file  Food Insecurity: No Food Insecurity (03/29/2023)   Hunger Vital Sign    Worried About Running Out of Food in the Last Year: Never true    Ran Out of Food in the Last Year: Never true  Transportation Needs: No Transportation Needs (03/29/2023)   PRAPARE - Administrator, Civil Service (Medical): No    Lack of Transportation (Non-Medical): No  Physical Activity: Not on file  Stress: Not on file  Social Connections: Not on file   Family History  Problem Relation Age of Onset   Heart disease Mother    Diabetes Mother    Diabetes Father    Cancer Sister  leukemia    Colon cancer Neg Hx    Stomach cancer Neg Hx    Not on File Prior to Admission medications   Medication Sig Start Date End Date Taking? Authorizing Provider  albuterol (VENTOLIN HFA) 108 (90 Base) MCG/ACT inhaler Inhale 2 puffs into the lungs every 6 (six) hours as needed for wheezing or shortness of breath.    [provider]  atorvastatin (LIPITOR) 20 MG tablet Take 20 mg by mouth daily. 10/05/22   [provider]  gabapentin (NEURONTIN) 100 MG capsule Take 100 mg by mouth at bedtime. 10/05/22   [provider]  glucose blood (ACCU-CHEK AVIVA) test strip Use as instructed to monitor FSBS 2x daily. Dx: E11.9 01/09/17   Salley Scarlet, MD   ibuprofen (ADVIL,MOTRIN) 200 MG tablet Take 1 tablet (200 mg total) by mouth 2 (two) times daily. 06/09/16   [place], Velna Hatchet, MD  LANTUS SOLOSTAR 100 UNIT/ML Solostar Pen Inject 40 Units into the skin at bedtime. 10/24/22   [provider]  TRADJENTA 5 MG TABS tablet Take 5 mg by mouth daily. 10/05/22   [provider]   DG Chest Port 1 View  Result Date: 03/29/2023 CLINICAL DATA:  Preop EXAM: PORTABLE CHEST 1 VIEW COMPARISON:  CT chest dated 03/13/2023 FINDINGS: Increased interstitial markings in the lungs bilaterally, lower lobe predominant, reflecting subpleural reticulation/fibrosis when correlating with prior CT. No superimposed opacity suspicious for pneumonia. No pleural effusion or pneumothorax. The heart is normal in size.  Thoracic aortic atherosclerosis. IMPRESSION: Chronic interstitial lung disease/fibrosis. No acute cardiopulmonary disease. Electronically Signed   By: Charline Bills M.D.   On: 03/29/2023 03:02   DG Femur Portable Min 2 Views Right  Result Date: 03/29/2023 CLINICAL DATA:  Fall, right femur deformity EXAM: RIGHT FEMUR PORTABLE 2 VIEW COMPARISON:  None Available. FINDINGS: Right hip and visualized bony pelvis are intact. Comminuted distal femoral shaft fracture. Knee is displaced 1 full shaft width medially relative to the distal femoral shaft. Mild apex posterior angulation. Associated soft tissue swelling with obvious deformity. IMPRESSION: Comminuted, displaced distal femoral shaft fracture, as described above. Electronically Signed   By: Charline Bills M.D.   On: 03/29/2023 03:01    Family History Reviewed and non-contributory, no pertinent history of problems with bleeding or anesthesia    Review of Systems No fevers or chills No numbness or tingling No chest pain No shortness of breath No bowel or bladder dysfunction No GI distress No headaches    OBJECTIVE  Vitals:Patient Vitals for the past 8 hrs:  BP Temp Temp src Pulse Resp  SpO2 Height Weight  03/29/23 0512 112/69 97.6 F (36.4 C) Oral 84 20 100 % -- --  03/29/23 0400 109/73 -- -- -- -- -- -- --  03/29/23 0318 (!) 109/59 -- -- 79 (!) 28 98 % -- --  03/29/23 0315 92/64 -- -- -- 18 -- -- --  03/29/23 0255 105/63 -- -- 81 -- 98 % -- --  03/29/23 0244 -- -- -- 86 -- 97 % -- --  03/29/23 0222 (!) 80/44 98.1 F (36.7 C) -- 88 (!) 21 94 % -- --  03/29/23 0219 -- -- -- -- -- --  (1.803 m) 63.5 kg   General: Alert, no acute distress Cardiovascular: Warm extremities noted Respiratory: No cyanosis, no use of accessory musculature GI: No organomegaly, abdomen is soft and non-tender Skin: No lesions in the area of chief complaint other than those listed below in MSK exam.  Neurologic: Sensation intact distally save for the below mentioned MSK exam Psychiatric: Patient is competent for consent with normal mood and affect Lymphatic: No swelling obvious and reported other than the area involved in the exam below Extremities   RLE: Right leg is in a knee immobilizer.  She has bruising and swelling about the knee.  She tolerates very little range of motion.  Active motion intact of the EHL/TA.  Her feet are cold.  1+ DP pulse.  Toes are warm and well-perfused.     Test Results Imaging  X-rays of the right femur demonstrates a comminuted, distal femoral shaft fracture, without obvious intra-articular extension.  Labs cbc Recent Labs    03/29/23 0230 03/29/23 0651  WBC 20.1* 13.8*  HGB 13.9 13.2  HCT 40.5 39.4  PLT 321 258     Labs coag Recent Labs    03/29/23 0230  INR 1.1    Recent Labs    03/29/23 0230 03/29/23 0651  NA 131* 132*  K 4.1 4.3  CL 95* 97*  CO2 25 25  GLUCOSE 412* 386*  BUN 22 23  CREATININE 0.99 0.84  CALCIUM 9.2 9.1

## 2023-03-29 NOTE — H&P (Signed)
History and Physical    Patient: Miranda Sampson:[id number] DOB: 11-14-1951 DOA: 03/29/2023 DOS: the patient was seen and examined on 03/29/2023 PCP: Benetta Spar, MD  Patient coming from: Home  Chief Complaint:  Chief Complaint  Patient presents with   Fall   HPI: Miranda Sampson is a 72 y.o. female with medical history significant of allergies, depression, diabetes mellitus, GERD, hyperlipidemia, substance abuse, and more presents the ED with a chief complaint of fall.  Patient reports that she was walking up the stairs when she missed 1 and fell backwards.  She did not hit her head.  She is not on any blood thinners.  She did not lose consciousness.  She reports that she did not had sudden pain, but she could not get up.  When the pain started it was 9 out of 10, and sharp.  Patient reports numbness in her right leg after the fall, but she can feel light touch on exam.  Patient has no other complaints about this fall.  She does report she has had dyspnea for the last month.  It has been getting better since she has been using her inhaler.  Patient has no other complaints.  Patient denies chest pain, palpitations, dysuria, hematuria, abdominal pain, diarrhea/constipation. Patient does not smoke.  She drinks 1 wine cooler twice per week per her report.  She uses cocaine and her last use was earlier on the same day of presentation.  She is not vaccinated for COVID or flu.  Patient is full code. Review of Systems: As mentioned in the history of present illness. All other systems reviewed and are negative. Past Medical History:  Diagnosis Date   Allergy    Arthritis    Depression    Diabetes mellitus    GERD (gastroesophageal reflux disease)    Hyperlipidemia    Hypertension    Substance abuse (HCC)    Cocaine quit 2009   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  1996   ESOPHAGOGASTRODUODENOSCOPY N/A 03/20/2014   Procedure:  ESOPHAGOGASTRODUODENOSCOPY (EGD);  Surgeon: Malissa Hippo, MD;  Location: [facility];  Service: Endoscopy;  Laterality: N/A;  140   TONSILLECTOMY     Social History:  reports that she has never smoked. She has never used smokeless tobacco. She reports current alcohol use. She reports that she does not use drugs.  Not on File  Family History  Problem Relation Age of Onset   Heart disease Mother    Diabetes Mother    Diabetes Father    Cancer Sister        leukemia    Colon cancer Neg Hx    Stomach cancer Neg Hx     Prior to Admission medications   Medication Sig Start Date End Date Taking? Authorizing Provider  albuterol (VENTOLIN HFA) 108 (90 Base) MCG/ACT inhaler Inhale 2 puffs into the lungs every 6 (six) hours as needed for wheezing or shortness of breath.    [provider]  atorvastatin (LIPITOR) 20 MG tablet Take 20 mg by mouth daily. 10/05/22   [provider]  gabapentin (NEURONTIN) 100 MG capsule Take 100 mg by mouth at bedtime. 10/05/22   [provider]  glucose blood (ACCU-CHEK AVIVA) test strip Use as instructed to monitor FSBS 2x daily. Dx: E11.9 01/09/17   Salley Scarlet, MD  ibuprofen (ADVIL,MOTRIN) 200 MG tablet Take 1 tablet (200 mg total) by mouth 2 (two) times  daily. 06/09/16   Salley Scarlet, MD  LANTUS SOLOSTAR 100 UNIT/ML Solostar Pen Inject 40 Units into the skin at bedtime. 10/24/22   [provider]  TRADJENTA 5 MG TABS tablet Take 5 mg by mouth daily. 10/05/22   [provider]    Physical Exam: Vitals:   03/29/23 0255 03/29/23 0315 03/29/23 0318 03/29/23 0400  BP: 105/63 92/64 (!) 109/59 109/73  Pulse: 81  79   Resp:  18 (!) 28   Temp:      SpO2: 98%  98%   Weight:      Height:       1.  General: Patient lying supine in bed,  no acute distress   2. Psychiatric: Alert and oriented x 3, mood and behavior normal for situation, pleasant and cooperative with exam   3. Neurologic: Speech and  language are normal, face is symmetric, moves all 4 extremities voluntarily, at baseline without acute deficits on limited exam   4. HEENMT:  Head is atraumatic, normocephalic, pupils reactive to light, neck is supple, trachea is midline, mucous membranes are moist, face is cachectic   5. Respiratory : Lungs are clear to auscultation bilaterally without wheezing, rhonchi, rales, no cyanosis, no increase in work of breathing or accessory muscle use   6. Cardiovascular : Heart rate normal, rhythm is regular, no murmurs, rubs or gallops, no peripheral edema, peripheral pulses palpated   7. Gastrointestinal:  Abdomen is soft, nondistended, nontender to palpation bowel sounds active, no masses or organomegaly palpated   8. Skin:  Skin is warm, dry and intact without rashes, acute lesions, or ulcers on limited exam   9.Musculoskeletal:  Right leg externally rotated and shortened  Data Reviewed: In the ED Temp 98.1, heart rate 79-88, respiratory rate 21-28, blood pressure 80/44-109/63, satting 94-98% Leukocytosis at 20.1, hemoglobin 13.9 Chemistry reveals a hyperglycemia at 412 Chest x-ray shows chronic interstitial lung disease with no acute cardiopulmonary disease EKG shows a heart rate of 79, sinus rhythm, QTc 483 X-ray of the right femur shows a comminuted displaced distal femoral shaft fracture Ortho was consulted and reports that they will see patient in the a.m. and likely do surgery on Thursday Admission requested for femur fracture  Assessment and Plan: * Displaced comminuted fracture of shaft of right femur, initial encounter for closed fracture - Mechanical trip and fall - X-ray shows comminuted displaced distal femoral shaft fracture - Ortho plans to operate on Thursday - Pain control - Continue to monitor  Leukocytosis - White blood cell count 20.1 - No infectious symptoms - Possibly stress related?? - Procalcitonin pending - UA pending - Continue to  monitor  Hypotension - Initially hypotensive - Vasovagal versus bleeding of the fracture - Hemoglobin stable at 13.9 - Trend in the a.m. - 1 L fluid given and blood pressure normalized - Need to monitor  Cocaine abuse - Counseled on importance of cessation - UDS ordered - TOC consult  Hyperlipidemia - Continue statin  Diabetes mellitus - Takes 40 units of basal insulin at baseline - Reduce to 20 units of basal insulin while in the hospital - Hyperglycemia at 412 - Sliding scale coverage - Continue to monitor      Advance Care Planning:   Code Status: Full Code  Consults: Ortho  Family Communication: No family at bedside  Severity of Illness: The appropriate patient status for this patient is INPATIENT. Inpatient status is judged to be reasonable and necessary in order to provide the required intensity of  service to ensure the patient's safety. The patient's presenting symptoms, physical exam findings, and initial radiographic and laboratory data in the context of their chronic comorbidities is felt to place them at high risk for further clinical deterioration. Furthermore, it is not anticipated that the patient will be medically stable for discharge from the hospital within 2 midnights of admission.   * I certify that at the point of admission it is my clinical judgment that the patient will require inpatient hospital care spanning beyond 2 midnights from the point of admission due to high intensity of service, high risk for further deterioration and high frequency of surveillance required.*  Author: Lilyan Gilford, DO 03/29/2023 4:49 AM  For on call review [email or web address].

## 2023-03-29 NOTE — Assessment & Plan Note (Signed)
-   Mechanical trip and fall - X-ray shows comminuted displaced distal femoral shaft fracture - Ortho plans to operate on Thursday - Pain control - Continue to monitor

## 2023-03-29 NOTE — ED Notes (Signed)
Patient transported to X-ray 

## 2023-03-29 NOTE — ED Provider Notes (Signed)
[place] EMERGENCY DEPARTMENT AT [facility] Provider Note   CSN: [id number] Arrival date & time: 03/29/23  5621     History  Chief Complaint  Patient presents with   Marletta Lor    Miranda Sampson is a 72 y.o. female.  The history is provided by the patient.  Fall  She has history of hypertension, diabetes, hyperlipidemia, depression, GERD and states that she tripped and fell at home injuring her right leg.  This occurred at about 10 PM and she had to wait until someone found her to bring her to the hospital.  She denies other injury.   Home Medications Prior to Admission medications   Medication Sig Start Date End Date Taking? Authorizing Provider  albuterol (VENTOLIN HFA) 108 (90 Base) MCG/ACT inhaler Inhale 2 puffs into the lungs every 6 (six) hours as needed for wheezing or shortness of breath.    [provider]  atorvastatin (LIPITOR) 20 MG tablet Take 20 mg by mouth daily. 10/05/22   [provider]  gabapentin (NEURONTIN) 100 MG capsule Take 100 mg by mouth at bedtime. 10/05/22   [provider]  glucose blood (ACCU-CHEK AVIVA) test strip Use as instructed to monitor FSBS 2x daily. Dx: E11.9 01/09/17   Salley Scarlet, MD  ibuprofen (ADVIL,MOTRIN) 200 MG tablet Take 1 tablet (200 mg total) by mouth 2 (two) times daily. 06/09/16   Pacheco, Velna Hatchet, MD  LANTUS SOLOSTAR 100 UNIT/ML Solostar Pen Inject 40 Units into the skin at bedtime. 10/24/22   [provider]  TRADJENTA 5 MG TABS tablet Take 5 mg by mouth daily. 10/05/22   [provider]      Allergies    Patient has no allergy information on record.    Review of Systems   Review of Systems  All other systems reviewed and are negative.   Physical Exam Updated Vital Signs BP (!) 80/44 (BP Location: Left Arm)   Pulse 88   Temp 98.1 F (36.7 C)   Resp (!) 21   Ht  (1.803 m)   Wt 63.5 kg   SpO2 94%   BMI 19.53 kg/m  Physical Exam Vitals and nursing note  reviewed.   72 year old female, resting comfortably and in no acute distress. Vital signs are significant for low blood pressure and slightly elevated respiratory rate. Oxygen saturation is 94%, which is normal. Head is normocephalic and atraumatic. PERRLA, EOMI. Oropharynx is clear. Neck is nontender and supple without adenopathy or JVD. Back is nontender and there is no CVA tenderness. Lungs are clear without rales, wheezes, or rhonchi. Chest is nontender. Heart has regular rate and rhythm without murmur. Abdomen is soft, flat, nontender. Extremities: Right leg is shortened and there is deformity noted of the distal right femur with soft tissue swelling in the same area.  There is also mild to moderate swelling around the right knee.  Distal neurovascular exam is intact with strong dorsalis pedis pulse, prompt capillary refill, normal sensation, normal strength of distal musculature.  Remainder of extremity exam is unremarkable, full range of motion all other joints without pain. Skin is warm and dry without rash. Neurologic: Awake and alert, no focal deficits.  ED Results / Procedures / Treatments   Labs (all labs ordered are listed, but only abnormal results are displayed) Labs Reviewed  BASIC METABOLIC PANEL - Abnormal; Notable for the following components:      Result Value   Sodium 131 (*)    Chloride  95 (*)    Glucose, Bld 412 (*)    All other components within normal limits  CBC WITH DIFFERENTIAL/PLATELET - Abnormal; Notable for the following components:   WBC 20.1 (*)    Neutro Abs 17.5 (*)    Monocytes Absolute 1.1 (*)    Abs Immature Granulocytes 0.11 (*)    All other components within normal limits  PROTIME-INR  URINALYSIS, ROUTINE W REFLEX MICROSCOPIC  TYPE AND SCREEN    EKG EKG Interpretation  Date/Time:  Wednesday March 29 2023 03:00:10 EDT Ventricular Rate:  79 PR Interval:  134 QRS Duration: 86 QT Interval:  421 QTC Calculation: 483 R Axis:   61 Text  Interpretation: Sinus rhythm Nonspecific T abnormalities, lateral leads ST elevation, consider inferior injury When compared with ECG of 11/09/2022, HEART RATE has decreased ST depression has improved Confirmed by Dione Booze ([id number]) on 03/29/2023 3:11:46 AM  Radiology DG Chest Port 1 View  Result Date: 03/29/2023 CLINICAL DATA:  Preop EXAM: PORTABLE CHEST 1 VIEW COMPARISON:  CT chest dated 03/13/2023 FINDINGS: Increased interstitial markings in the lungs bilaterally, lower lobe predominant, reflecting subpleural reticulation/fibrosis when correlating with prior CT. No superimposed opacity suspicious for pneumonia. No pleural effusion or pneumothorax. The heart is normal in size.  Thoracic aortic atherosclerosis. IMPRESSION: Chronic interstitial lung disease/fibrosis. No acute cardiopulmonary disease. Electronically Signed   By: Charline Bills M.D.   On: 03/29/2023 03:02   DG Femur Portable Min 2 Views Right  Result Date: 03/29/2023 CLINICAL DATA:  Fall, right femur deformity EXAM: RIGHT FEMUR PORTABLE 2 VIEW COMPARISON:  None Available. FINDINGS: Right hip and visualized bony pelvis are intact. Comminuted distal femoral shaft fracture. Knee is displaced 1 full shaft width medially relative to the distal femoral shaft. Mild apex posterior angulation. Associated soft tissue swelling with obvious deformity. IMPRESSION: Comminuted, displaced distal femoral shaft fracture, as described above. Electronically Signed   By: Charline Bills M.D.   On: 03/29/2023 03:01  `  Procedures Procedures  Cardiac monitor shows normal sinus rhythm, per my interpretation.  Medications Ordered in ED Medications  ceFAZolin (ANCEF) IVPB 2g/100 mL premix (has no administration in time range)  tranexamic acid (CYKLOKAPRON) IVPB 1,000 mg (has no administration in time range)  sodium chloride 0.9 % bolus 1,000 mL (1,000 mLs Intravenous New Bag/Given 03/29/23 0240)  fentaNYL (SUBLIMAZE) injection 50 mcg (50 mcg  Intravenous Given 03/29/23 0324)    ED Course/ Medical Decision Making/ A&P                             Medical Decision Making Amount and/or Complexity of Data Reviewed Labs: ordered. Radiology: ordered.  Risk Prescription drug management. Decision regarding hospitalization.   Fall with injury to right leg which appears to be a distal femur fracture.  I have initiated the ED hip/femur fracture protocol.  This includes x-rays of the right femur, chest x-ray, ECG, basic metabolic panel, CBC, INR, type and screen.  Because of hypotension, I have ordered IV fluids.  I have also ordered a dose of fentanyl for pain since that will not lower her blood pressure.  I have reviewed her past records, and I note ED visit on 01/13/2023 for fall, hospital admission on 11/07/2022 for weakness and hypotension.  Blood pressure has come up to normal with IV hydration.  Femur x-ray shows comminuted, displaced distal femoral shaft fracture, chest x-ray shows no acute cardiopulmonary disease.  I have independently viewed the images, and  agree with the radiologist's interpretation.  I have reviewed and interpreted her laboratory test, and my interpretation is hyperglycemia, mild hyponatremia consistent with degree of hyperglycemia.  I have reviewed and interpreted her electrocardiogram and my interpretation is nonspecific ST and T changes.  I have discussed the case with Dr. Dallas Schimke of orthopedic surgery who has reviewed her x-ray and requests she be placed in a knee immobilizer.  I have discussed case with Dr. Carren Rang of [facility], who agrees to admit the patient.  CRITICAL CARE Performed by: Dione Booze Total critical care time: 50 minutes Critical care time was exclusive of separately billable procedures and treating other patients. Critical care was necessary to treat or prevent imminent or life-threatening deterioration. Critical care was time spent personally by me on the following activities:  development of treatment plan with patient and/or surrogate as well as nursing, discussions with consultants, evaluation of patient's response to treatment, examination of patient, obtaining history from patient or surrogate, ordering and performing treatments and interventions, ordering and review of laboratory studies, ordering and review of radiographic studies, pulse oximetry and re-evaluation of patient's condition.  Final Clinical Impression(s) / ED Diagnoses Final diagnoses:  Closed fracture of distal end of right femur, initial encounter  Hypotension due to hypovolemia  Hyponatremia    Rx / DC Orders ED Discharge Orders     None         Dione Booze, MD 03/29/23 [phone]

## 2023-03-29 NOTE — Assessment & Plan Note (Signed)
Continue statin. 

## 2023-03-29 NOTE — Assessment & Plan Note (Signed)
-   Counseled on importance of cessation - UDS ordered - TOC consult

## 2023-03-29 NOTE — ED Notes (Addendum)
ED TO INPATIENT HANDOFF REPORT  ED Nurse Name and Phone #:   Elmer Sow- [phone]  S Name/Age/Gender Miranda Sampson 72 y.o. female Room/Bed: APA08/APA08  Code Status   Code Status: Prior  Home/SNF/Other Home Patient oriented to: self, place, time, and situation Is this baseline? Yes   Triage Complete: Triage complete  Chief Complaint Displaced comminuted fracture of shaft of right femur, initial encounter for closed fracture [S72.351A]  Triage Note Pt states she had a mechanical fall around 2200 last night, falling with her right leg "bent up under her". Swelling noted to right knee and up thigh, states she is unable to bend right leg. Denies hitting head or LOC. Report most of pain is mid thigh.    Allergies Not on File  Level of Care/Admitting Diagnosis ED Disposition     ED Disposition  Admit   Condition  --   Comment  Hospital Area: [facility] [[id number]]  Level of Care: Telemetry [5]  Covid Evaluation: Asymptomatic - no recent exposure (last 10 days) testing not required  Diagnosis: Displaced comminuted fracture of shaft of right femur, initial encounter for closed fracture [[id number]]  Admitting Physician: Lilyan Gilford [[id number]]  Attending Physician: Lilyan Gilford [[id number]]  Certification:: I certify this patient will need inpatient services for at least 2 midnights  Estimated Length of Stay: 2          B Medical/Surgery History Past Medical History:  Diagnosis Date   Allergy    Arthritis    Depression    Diabetes mellitus    GERD (gastroesophageal reflux disease)    Hyperlipidemia    Hypertension    Substance abuse (HCC)    Cocaine quit 2009   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  1996   ESOPHAGOGASTRODUODENOSCOPY N/A 03/20/2014   Procedure: ESOPHAGOGASTRODUODENOSCOPY (EGD);  Surgeon: Malissa Hippo, MD;  Location: [facility];  Service: Endoscopy;  Laterality: N/A;  140    TONSILLECTOMY       A IV Location/Drains/Wounds Patient Lines/Drains/Airways Status     Active Line/Drains/Airways     Name Placement date Placement time Site Days   Peripheral IV 03/29/23 20 G Right Forearm 03/29/23  0238  Forearm  less than 1   External Urinary Catheter 11/07/22  2213  --  142            Intake/Output Last 24 hours No intake or output data in the 24 hours ending 03/29/23 0403  Labs/Imaging Results for orders placed or performed during the hospital encounter of 03/29/23 (from the past 48 hour(s))  Basic metabolic panel     Status: Abnormal   Collection Time: 03/29/23  2:30 AM  Result Value Ref Range   Sodium 131 (L) 135 - 145 mmol/L   Potassium 4.1 3.5 - 5.1 mmol/L   Chloride 95 (L) 98 - 111 mmol/L   CO2 25 22 - 32 mmol/L   Glucose, Bld 412 (H) 70 - 99 mg/dL    Comment: Glucose reference range applies only to samples taken after fasting for at least 8 hours.   BUN 22 8 - 23 mg/dL   Creatinine, Ser 8.65 0.44 - 1.00 mg/dL   Calcium 9.2 8.9 - 78.4 mg/dL   GFR, Estimated >69 >62 mL/min    Comment: (NOTE) Calculated using the CKD-EPI Creatinine Equation (2021)    Anion gap 11 5 - 15    Comment: Performed at [facility], [address] [place], [place] [address]  CBC with Differential     Status: Abnormal   Collection Time: 03/29/23  2:30 AM  Result Value Ref Range   WBC 20.1 (H) 4.0 - 10.5 K/uL   RBC 4.13 3.87 - 5.11 MIL/uL   Hemoglobin 13.9 12.0 - 15.0 g/dL   HCT 60.4 54.0 - 98.1 %   MCV 98.1 80.0 - 100.0 fL   MCH 33.7 26.0 - 34.0 pg   MCHC 34.3 30.0 - 36.0 g/dL   RDW 19.1 47.8 - 29.5 %   Platelets 321 150 - 400 K/uL   nRBC 0.0 0.0 - 0.2 %   Neutrophils Relative % 88 %   Neutro Abs 17.5 (H) 1.7 - 7.7 K/uL   Lymphocytes Relative 6 %   Lymphs Abs 1.3 0.7 - 4.0 K/uL   Monocytes Relative 5 %   Monocytes Absolute 1.1 (H) 0.1 - 1.0 K/uL   Eosinophils Relative 0 %   Eosinophils Absolute 0.1 0.0 - 0.5 K/uL   Basophils Relative 0 %   Basophils  Absolute 0.1 0.0 - 0.1 K/uL   Immature Granulocytes 1 %   Abs Immature Granulocytes 0.11 (H) 0.00 - 0.07 K/uL    Comment: Performed at [facility], [address] [place], [place] [address]  Protime-INR     Status: None   Collection Time: 03/29/23  2:30 AM  Result Value Ref Range   Prothrombin Time 14.3 11.4 - 15.2 seconds   INR 1.1 0.8 - 1.2    Comment: (NOTE) INR goal varies based on device and disease states. Performed at [facility], [address], [place], [place] [address]   Type and screen [facility]     Status: None (Preliminary result)   Collection Time: 03/29/23  3:35 AM  Result Value Ref Range   ABO/RH(D) O POS    Antibody Screen PENDING    Sample Expiration      04/01/2023,2359 Performed at [facility], [address], [place], [place] [address]    DG Chest Port 1 View  Result Date: 03/29/2023 CLINICAL DATA:  Preop EXAM: PORTABLE CHEST 1 VIEW COMPARISON:  CT chest dated 03/13/2023 FINDINGS: Increased interstitial markings in the lungs bilaterally, lower lobe predominant, reflecting subpleural reticulation/fibrosis when correlating with prior CT. No superimposed opacity suspicious for pneumonia. No pleural effusion or pneumothorax. The heart is normal in size.  Thoracic aortic atherosclerosis. IMPRESSION: Chronic interstitial lung disease/fibrosis. No acute cardiopulmonary disease. Electronically Signed   By: Charline Bills M.D.   On: 03/29/2023 03:02   DG Femur Portable Min 2 Views Right  Result Date: 03/29/2023 CLINICAL DATA:  Fall, right femur deformity EXAM: RIGHT FEMUR PORTABLE 2 VIEW COMPARISON:  None Available. FINDINGS: Right hip and visualized bony pelvis are intact. Comminuted distal femoral shaft fracture. Knee is displaced 1 full shaft width medially relative to the distal femoral shaft. Mild apex posterior angulation. Associated soft tissue swelling with obvious deformity. IMPRESSION: Comminuted, displaced distal femoral shaft fracture, as described  above. Electronically Signed   By: Charline Bills M.D.   On: 03/29/2023 03:01    Pending Labs Unresulted Labs (From admission, onward)     Start     Ordered   03/29/23 0343  Urinalysis, Routine w reflex microscopic -Urine, Clean Catch  Once,   R       Question:  Specimen Source  Answer:  Urine, Clean Catch   03/29/23 0342            Vitals/Pain Today's Vitals  03/29/23 0222 03/29/23 0244 03/29/23 0255 03/29/23 0318  BP: (!) 80/44  105/63 (!) 109/59  Pulse: 88 86 81 79  Resp: (!) 21   (!) 28  Temp: 98.1 F (36.7 C)     SpO2: 94% 97% 98% 98%  Weight:      Height:      PainSc:        Isolation Precautions No active isolations  Medications Medications  ceFAZolin (ANCEF) IVPB 2g/100 mL premix (has no administration in time range)  tranexamic acid (CYKLOKAPRON) IVPB 1,000 mg (has no administration in time range)  sodium chloride 0.9 % bolus 1,000 mL (1,000 mLs Intravenous New Bag/Given 03/29/23 0240)  fentaNYL (SUBLIMAZE) injection 50 mcg (50 mcg Intravenous Given 03/29/23 0324)    Mobility non-ambulatory     Focused Assessments     R Recommendations: See Admitting Provider Note  Report given to:   Additional Notes:

## 2023-03-29 NOTE — Assessment & Plan Note (Addendum)
-   Takes 40 units of basal insulin at baseline - Reduce to 20 units of basal insulin while in the hospital - Hyperglycemia at 412 - Sliding scale coverage - Continue to monitor

## 2023-03-29 NOTE — ED Triage Notes (Addendum)
Pt states she had a mechanical fall around 2200 last night, falling with her right leg "bent up under her". Swelling noted to right knee and up thigh, states she is unable to bend right leg. Denies hitting head or LOC. Report most of pain is mid thigh.

## 2023-03-29 NOTE — Assessment & Plan Note (Signed)
-   White blood cell count 20.1 - No infectious symptoms - Possibly stress related?? - Procalcitonin pending - UA pending - Continue to monitor

## 2023-03-30 DIAGNOSIS — F1910 Other psychoactive substance abuse, uncomplicated: Secondary | ICD-10-CM

## 2023-03-30 DIAGNOSIS — Z01810 Encounter for preprocedural cardiovascular examination: Secondary | ICD-10-CM

## 2023-03-30 DIAGNOSIS — R9431 Abnormal electrocardiogram [ECG] [EKG]: Secondary | ICD-10-CM

## 2023-03-30 DIAGNOSIS — S72351A Displaced comminuted fracture of shaft of right femur, initial encounter for closed fracture: Secondary | ICD-10-CM | POA: Diagnosis not present

## 2023-03-30 LAB — CBC
HCT: 33.5 % — ABNORMAL LOW (ref 36.0–46.0)
Hemoglobin: 11 g/dL — ABNORMAL LOW (ref 12.0–15.0)
MCH: 33.1 pg (ref 26.0–34.0)
MCHC: 32.8 g/dL (ref 30.0–36.0)
MCV: 100.9 fL — ABNORMAL HIGH (ref 80.0–100.0)
Platelets: 245 10*3/uL (ref 150–400)
RBC: 3.32 MIL/uL — ABNORMAL LOW (ref 3.87–5.11)
RDW: 13.3 % (ref 11.5–15.5)
WBC: 7.9 10*3/uL (ref 4.0–10.5)
nRBC: 0 % (ref 0.0–0.2)

## 2023-03-30 LAB — BASIC METABOLIC PANEL
Anion gap: 8 (ref 5–15)
BUN: 26 mg/dL — ABNORMAL HIGH (ref 8–23)
CO2: 27 mmol/L (ref 22–32)
Calcium: 8.6 mg/dL — ABNORMAL LOW (ref 8.9–10.3)
Chloride: 99 mmol/L (ref 98–111)
Creatinine, Ser: 0.62 mg/dL (ref 0.44–1.00)
GFR, Estimated: >60 mL/min (ref >60)
Glucose, Bld: 51 mg/dL — ABNORMAL LOW (ref 70–99)
Potassium: 3.7 mmol/L (ref 3.5–5.1)
Sodium: 134 mmol/L — ABNORMAL LOW (ref 135–145)

## 2023-03-30 LAB — URINALYSIS, ROUTINE W REFLEX MICROSCOPIC
Bilirubin Urine: NEGATIVE
Glucose, UA: NEGATIVE mg/dL
Hgb urine dipstick: NEGATIVE
Ketones, ur: NEGATIVE mg/dL
Leukocytes,Ua: NEGATIVE
Nitrite: NEGATIVE
Protein, ur: NEGATIVE mg/dL
Specific Gravity, Urine: 1.02 (ref 1.005–1.030)
pH: 5 (ref 5.0–8.0)

## 2023-03-30 LAB — RAPID URINE DRUG SCREEN, HOSP PERFORMED
Amphetamines: NOT DETECTED
Barbiturates: NOT DETECTED
Benzodiazepines: NOT DETECTED
Cocaine: POSITIVE — AB
Opiates: POSITIVE — AB
Tetrahydrocannabinol: NOT DETECTED

## 2023-03-30 LAB — GLUCOSE, CAPILLARY
Glucose-Capillary: 51 mg/dL — ABNORMAL LOW (ref 70–99)
Glucose-Capillary: 129 mg/dL — ABNORMAL HIGH (ref 70–99)
Glucose-Capillary: 144 mg/dL — ABNORMAL HIGH (ref 70–99)
Glucose-Capillary: 83 mg/dL (ref 70–99)
Glucose-Capillary: 191 mg/dL — ABNORMAL HIGH (ref 70–99)
Glucose-Capillary: 231 mg/dL — ABNORMAL HIGH (ref 70–99)

## 2023-03-30 LAB — MAGNESIUM: Magnesium: 1.7 mg/dL (ref 1.7–2.4)

## 2023-03-30 MED ORDER — POTASSIUM CHLORIDE CRYS ER 20 MEQ PO TBCR
20.0000 meq | EXTENDED_RELEASE_TABLET | Freq: Once | ORAL | Status: AC
  Administered 2023-03-30: 20 meq via ORAL
  Filled 2023-03-30: qty 1

## 2023-03-30 MED ORDER — DEXTROSE 50 % IV SOLN
25.0000 g | INTRAVENOUS | Status: AC
  Administered 2023-03-30: 25 g via INTRAVENOUS
  Filled 2023-03-30: qty 50

## 2023-03-30 MED ORDER — INSULIN GLARGINE-YFGN 100 UNIT/ML ~~LOC~~ SOLN
30.0000 [IU] | Freq: Every day | SUBCUTANEOUS | Status: DC
  Administered 2023-03-30 – 2023-03-31 (×2): 30 [IU] via SUBCUTANEOUS
  Filled 2023-03-30 (×3): qty 0.3

## 2023-03-30 MED ORDER — SODIUM CHLORIDE 0.9 % IV SOLN: INTRAVENOUS | Status: DC

## 2023-03-30 MED ORDER — MAGNESIUM SULFATE 2 GM/50ML IV SOLN
2.0000 g | Freq: Once | INTRAVENOUS | Status: AC
  Administered 2023-03-30: 2 g via INTRAVENOUS
  Filled 2023-03-30: qty 50

## 2023-03-30 NOTE — Progress Notes (Signed)
PROGRESS NOTE    Miranda Sampson  ZOX:[id number] DOB: [birth year] DOA: 03/29/2023 PCP: Benetta Spar, MD   Brief Narrative:    Miranda Sampson is a 72 y.o. female with medical history significant of allergies, depression, diabetes mellitus, GERD, hyperlipidemia, substance abuse, and more presents the ED with a chief complaint of fall.  She was admitted for displaced comminuted fracture of right femoral shaft.  Orthopedics to evaluate with plans for operation on Friday with delayed due to need for cardiology clearance and noted SVT.  Assessment & Plan:   Principal Problem:   Displaced comminuted fracture of shaft of right femur, initial encounter for closed fracture Active Problems:   Diabetes mellitus   Hyperlipidemia   Cocaine abuse   Hypotension   Leukocytosis  Assessment and Plan:  Displaced comminuted fracture of shaft of right femur, initial encounter for closed fracture - Mechanical trip and fall - X-ray shows comminuted displaced distal femoral shaft fracture - Ortho plans to operate on Friday 4/19 -Appreciate cardiology consultation for clearance - Pain control - Continue to monitor   SVT -Known history of SVT and required adenosine 4/17, likely triggered by cocaine use  -Cardiology replacing electrolytes and will plan to check TSH  Hypotension-stabilized - Initially hypotensive - Vasovagal versus bleeding of the fracture - Hemoglobin stable at 13.9 - Trend in the a.m. - 1 L fluid given and blood pressure normalized - Need to monitor   Cocaine abuse - Counseled on importance of cessation - UDS ordered and pending - TOC consult   Hyperlipidemia - Continue statin   Diabetes mellitus - Continue SSI -Semglee 30 units at bedtime -Hemoglobin A1c 8.5% 10/2022   DVT prophylaxis: SCDs Code Status: Full Family Communication: None at bedside Disposition Plan:  Status is: Inpatient Remains inpatient appropriate because: Need for IV medications and inpatient  procedure.   Consultants:  Orthopedics Cardiology  Procedures:  None  Antimicrobials:  Anti-infectives (From admission, onward)    Start     Dose/Rate Route Frequency Ordered Stop   03/30/23 1200  ceFAZolin (ANCEF) IVPB 2g/100 mL premix        2 g 200 mL/hr over 30 Minutes Intravenous On call to O.R. 03/29/23 0350 03/31/23 0559        Subjective: Patient seen and evaluated today with no new acute complaints or concerns.  She was noted to have SVT yesterday afternoon that required administration of adenosine with no further arrhythmias noted.  She continues to have pain to her right knee.  Objective: Vitals:   03/29/23 1817 03/29/23 2005 03/29/23 2108 03/30/23 0537  BP: 106/67 105/64 112/68 100/68  Pulse: 86 81 76 75  Resp: Temp:  98.8 F (37.1 C) 98.7 F (37.1 C) 97.8 F (36.6 C)  TempSrc:  Oral Oral   SpO2: 98% 100% 100% 100%  Weight:      Height:        Intake/Output Summary (Last 24 hours) at 03/30/2023 1128 Last data filed at 03/30/2023 0645 Gross per 24 hour  Intake --  Output 400 ml  Net -400 ml   Filed Weights   03/29/23 0219  Weight: 63.5 kg    Examination:  General exam: Appears calm and comfortable  Respiratory system: Clear to auscultation. Respiratory effort normal. Cardiovascular system: S1 & S2 heard, RRR.  Gastrointestinal system: Abdomen is soft Central nervous system: Alert and awake Extremities: Right knee in brace Skin: No significant lesions noted Psychiatry: Flat affect.    Data  Reviewed: I have personally reviewed following labs and imaging studies  CBC: Recent Labs  Lab 03/29/23 0230 03/29/23 0651 03/30/23 0409  WBC 20.1* 13.8* 7.9  NEUTROABS 17.5* 11.6*  --   HGB 13.9 13.2 11.0*  HCT 40.5 39.4 33.5*  MCV 98.1 99.0 100.9*  PLT 321 258 245   Basic Metabolic Panel: Recent Labs  Lab 03/29/23 0230 03/29/23 0651 03/30/23 0409  NA 131* 132* 134*  K 4.1 4.3 3.7  CL 95* 97* 99  CO2 GLUCOSE  412* 386* 51*  BUN 22 23 26*  CREATININE 0.99 0.84 0.62  CALCIUM 9.2 9.1 8.6*  MG  --  1.4* 1.7   GFR: Estimated Creatinine Clearance: 62.8 mL/min (by C-G formula based on SCr of 0.62 mg/dL). Liver Function Tests: Recent Labs  Lab 03/29/23 0651  AST 16  ALT 13  ALKPHOS 76  BILITOT 1.2  PROT 7.7  ALBUMIN 3.2*   No results for input(s): "LIPASE", "AMYLASE" in the last 168 hours. No results for input(s): "AMMONIA" in the last 168 hours. Coagulation Profile: Recent Labs  Lab 03/29/23 0230  INR 1.1   Cardiac Enzymes: No results for input(s): "CKTOTAL", "CKMB", "CKMBINDEX", "TROPONINI" in the last 168 hours. BNP (last 3 results) No results for input(s): "PROBNP" in the last 8760 hours. HbA1C: No results for input(s): "HGBA1C" in the last 72 hours. CBG: Recent Labs  Lab 03/29/23 2118 03/30/23 0648 03/30/23 0755 03/30/23 0803 03/30/23 1118  GLUCAP 85 51* 144* 129* 83   Lipid Profile: No results for input(s): "CHOL", "HDL", "LDLCALC", "TRIG", "CHOLHDL", "LDLDIRECT" in the last 72 hours. Thyroid Function Tests: No results for input(s): "TSH", "T4TOTAL", "FREET4", "T3FREE", "THYROIDAB" in the last 72 hours. Anemia Panel: No results for input(s): "VITAMINB12", "FOLATE", "FERRITIN", "TIBC", "IRON", "RETICCTPCT" in the last 72 hours. Sepsis Labs: No results for input(s): "PROCALCITON", "LATICACIDVEN" in the last 168 hours.  Recent Results (from the past 240 hour(s))  Surgical PCR screen     Status: None   Collection Time: 03/29/23  9:19 AM   Specimen: Nasal Mucosa; Nasal Swab  Result Value Ref Range Status   MRSA, PCR NEGATIVE NEGATIVE Final   Staphylococcus aureus NEGATIVE NEGATIVE Final    Comment: (NOTE) The Xpert SA Assay (FDA approved for NASAL specimens in patients 98 years of age and older), is one component of a comprehensive surveillance program. It is not intended to diagnose infection nor to guide or monitor treatment. Performed at [facility], [address], [place], [place] [address]          Radiology Studies: [facility] 1 View  Result Date: 03/29/2023 CLINICAL DATA:  Preop EXAM: PORTABLE CHEST 1 VIEW COMPARISON:  CT chest dated 03/13/2023 FINDINGS: Increased interstitial markings in the lungs bilaterally, lower lobe predominant, reflecting subpleural reticulation/fibrosis when correlating with prior CT. No superimposed opacity suspicious for pneumonia. No pleural effusion or pneumothorax. The heart is normal in size.  Thoracic aortic atherosclerosis. IMPRESSION: Chronic interstitial lung disease/fibrosis. No acute cardiopulmonary disease. Electronically Signed   By: Charline Bills M.D.   On: 03/29/2023 03:02   DG Femur Portable Min 2 Views Right  Result Date: 03/29/2023 CLINICAL DATA:  Fall, right femur deformity EXAM: RIGHT FEMUR PORTABLE 2 VIEW COMPARISON:  None Available. FINDINGS: Right hip and visualized bony pelvis are intact. Comminuted distal femoral shaft fracture. Knee is displaced 1 full shaft width medially relative to the distal femoral shaft. Mild apex posterior angulation. Associated soft tissue swelling with obvious deformity.  IMPRESSION: Comminuted, displaced distal femoral shaft fracture, as described above. Electronically Signed   By: Charline Bills M.D.   On: 03/29/2023 03:01        Scheduled Meds:  atorvastatin  20 mg Oral Daily   gabapentin  100 mg Oral QHS   insulin aspart  0-15 Units Subcutaneous TID WC   insulin aspart  0-5 Units Subcutaneous QHS   insulin glargine-yfgn  30 Units Subcutaneous QHS   Continuous Infusions:  sodium chloride      ceFAZolin (ANCEF) IV     tranexamic acid       LOS: 1 day    Time spent: 35 minutes    Rianne Degraaf Hoover Brunette, DO [facility]  If 7PM-7AM, please contact night-coverage [email or web address] 03/30/2023, 11:28 AM

## 2023-03-30 NOTE — Progress Notes (Signed)
Pt blood glucose at 51, hypoglycemic protocol initiated and given 50% dextrose solution via IV; CBG will be rechecked

## 2023-03-30 NOTE — Inpatient Diabetes Management (Signed)
Inpatient Diabetes Program Recommendations  AACE/ADA: New Consensus Statement on Inpatient Glycemic Control   Target Ranges:  Prepandial:   less than 140 mg/dL      Peak postprandial:   less than 180 mg/dL (1-2 hours)      Critically ill patients:  140 - 180 mg/dL    Latest Reference Range & Units 03/29/23 07:33 03/29/23 10:56 03/29/23 13:33 03/29/23 16:45 03/29/23 21:18  Glucose-Capillary 70 - 99 mg/dL 161 (H) 096 (H) 045 (H) 202 (H) 85    Latest Reference Range & Units 11/09/22 05:43  Hemoglobin A1C 4.8 - 5.6 % 8.5 (H)   Review of Glycemic Control  Diabetes history: DM2 Outpatient Diabetes medications: Lantus 40 units QHS, Tradjenta 5 mg daily Current orders for Inpatient glycemic control: Levemir 20 units QHS, Semglee 30 units daily, Novolog 0-15 units TID with meals, Novolog 0-5 units QHS, Tradjenta 5 mg daily  Inpatient Diabetes Program Recommendations:    Insulin: Patient received Semglee 30 units at 16:20 and Levemir 20 units at 22:14 on 03/29/23. CBG 85 at 21:18 on 03/29/23. Patient is NPO this morning. Please discontinue Levemir and change Semglee to 30 units QHS (to be given at bedtime).  NOTE: Sent chat message to Sundra Aland, RN to ask about checking CBG now due to concern about hypoglycemia since patient got both Levemir and Semglee on 03/29/23.  Thanks, Orlando Penner, RN, MSN, CDCES Diabetes Coordinator Inpatient Diabetes Program [phone] (Team Pager from 8am to 5pm)

## 2023-03-30 NOTE — Consult Note (Addendum)
Cardiology Consultation   Patient ID: BRADY PLANT MRN: [id number]; DOB: 03-15-1951  Admit date: 03/29/2023 Date of Consult: 03/30/2023  PCP:  Benetta Spar, MD   [facility] Providers Cardiologist: Dr. Wyline Mood (last visit in 2017)  Patient Profile:   Miranda Sampson is a 72 y.o. female with a hx of pSVT (episode in 05/2016 and documented recurrence in 10/2022 in the settiing of hypotension and PNA), HTN, HLD, GERD and history of substance abuse who is being seen 03/30/2023 for the evaluation of preoperative cardiac clearance at the request of Dr. Sherryll Burger.  History of Present Illness:   Miranda Sampson was last examined by Dr. Wyline Mood in 06/2016 as a referral for SVT. Reported still having intermittent palpitations and was transitioned from Lopressor from PRN to 12.5mg  BID. Was informed to follow-up in 6 months but has not been evaluated by Cardiology since.   She presented to [facility] ED on 03/29/2023 for evaluation of right leg pain after suffering a mechanical fall. Imaging showed a comminuted, displaced distal femoral shaft fracture. Initial labs showed WBC 20.1, Hgb 13.9, platelets 321, Na+ 131, K+ 4.1 and creatinine 0.99. Mg 1.4. CXR showed no acute cardiopulmonary abnormalities. EKG showed NSR, HR 79 with upsloping of the ST segment along the inferior and precordial leads.   She was admitted for further management with plans to go to the OR today for intramedullary femoral nailing. Cardiology is asked to see for preoperative cardiac clearance as she was was in SVT yesterday evening with HR in the 140's to 150's. She received 6 mg IV Adenosine at 1810 and converted back to NSR. Hs Troponin values were obtained and negative at 6.  In talking with the patient today, she reports she felt nauseated yesterday around the time she received medications for her heart rhythm but denies any specific chest pain or palpitations. She is not overly active at baseline but denies any recent dyspnea  on exertion or chest pain when doing normal chores around the house. Her daughter does her grocery shopping for her. She denies any specific orthopnea, PND or pitting edema. Says she does consume 1 cup of coffee daily and 1-2 beers daily. Also reports using cocaine and used the evening prior to coming to the ED.    Past Medical History:  Diagnosis Date   Allergy    Arthritis    Depression    Diabetes mellitus    GERD (gastroesophageal reflux disease)    Hyperlipidemia    Hypertension    Substance abuse    Cocaine quit 2009    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  1996   ESOPHAGOGASTRODUODENOSCOPY N/A 03/20/2014   Procedure: ESOPHAGOGASTRODUODENOSCOPY (EGD);  Surgeon: Malissa Hippo, MD;  Location: [facility];  Service: Endoscopy;  Laterality: N/A;  140   TONSILLECTOMY       Home Medications:  Prior to Admission medications   Medication Sig Start Date End Date Taking? Authorizing Provider  albuterol (VENTOLIN HFA) 108 (90 Base) MCG/ACT inhaler Inhale 2 puffs into the lungs every 6 (six) hours as needed for wheezing or shortness of breath.   Yes [provider]  atorvastatin (LIPITOR) 20 MG tablet Take 20 mg by mouth daily. 10/05/22  Yes [provider]  gabapentin (NEURONTIN) 100 MG capsule Take 100 mg by mouth at bedtime. 10/05/22  Yes [provider]  LANTUS SOLOSTAR 100 UNIT/ML Solostar Pen Inject 40 Units into the skin at  bedtime. 10/24/22  Yes [provider]  TRADJENTA 5 MG TABS tablet Take 5 mg by mouth daily. 10/05/22  Yes [provider]  glucose blood (ACCU-CHEK AVIVA) test strip Use as instructed to monitor FSBS 2x daily. Dx: E11.9 01/09/17   Salley Scarlet, MD  ibuprofen (ADVIL,MOTRIN) 200 MG tablet Take 1 tablet (200 mg total) by mouth 2 (two) times daily. Patient not taking: Reported on 03/29/2023 06/09/16   Salley Scarlet, MD    Inpatient Medications: Scheduled Meds:   atorvastatin  20 mg Oral Daily   gabapentin  100 mg Oral QHS   insulin aspart  0-15 Units Subcutaneous TID WC   insulin aspart  0-5 Units Subcutaneous QHS   insulin glargine-yfgn  30 Units Subcutaneous QHS   potassium chloride  20 mEq Oral Once   Continuous Infusions:   ceFAZolin (ANCEF) IV     magnesium sulfate bolus IVPB     tranexamic acid     PRN Meds: acetaminophen **OR** acetaminophen, albuterol, morphine injection, ondansetron **OR** ondansetron (ZOFRAN) IV, oxyCODONE  Allergies:   Not on File  Social History:   Social History   Socioeconomic History   Marital status: Widowed    Spouse name: Not on file   Number of children: Not on file   Years of education: Not on file   Highest education level: Not on file  Occupational History   Not on file  Tobacco Use   Smoking status: Never   Smokeless tobacco: Never  Substance and Sexual Activity   Alcohol use: Yes    Comment: socially   Drug use: No    Comment: former cocaine user (three years ago)   Sexual activity: Yes  Other Topics Concern   Not on file  Social History Narrative   Not on file   Social Determinants of Health   Financial Resource Strain: Not on file  Food Insecurity: No Food Insecurity (03/29/2023)   Hunger Vital Sign    Worried About Running Out of Food in the Last Year: Never true    Ran Out of Food in the Last Year: Never true  Transportation Needs: No Transportation Needs (03/29/2023)   PRAPARE - Administrator, Civil Service (Medical): No    Lack of Transportation (Non-Medical): No  Physical Activity: Not on file  Stress: Not on file  Social Connections: Not on file  Intimate Partner Violence: Not At Risk (03/29/2023)   Humiliation, Afraid, Rape, and Kick questionnaire    Fear of Current or Ex-Partner: No    Emotionally Abused: No    Physically Abused: No    Sexually Abused: No    Family History:    Family History  Problem Relation Age of Onset   Heart disease Mother     Diabetes Mother    Diabetes Father    Cancer Sister        leukemia    Colon cancer Neg Hx    Stomach cancer Neg Hx      ROS:  Please see the history of present illness.   All other ROS reviewed and negative.     Physical Exam/Data:   Vitals:   03/29/23 1817 03/29/23 2005 03/29/23 2108 03/30/23 0537  BP: 106/67 105/64 112/68 100/68  Pulse: 86 81 76 75  Resp: 20 18 20 18   Temp:  98.8 F (37.1 C) 98.7 F (37.1 C) 97.8 F (36.6 C)  TempSrc:  Oral Oral   SpO2: 98% 100% 100% 100%  Weight:  Height:        Intake/Output Summary (Last 24 hours) at 03/30/2023 0946 Last data filed at 03/30/2023 0645 Gross per 24 hour  Intake --  Output 400 ml  Net -400 ml      03/29/2023    2:19 AM 12/30/2022    4:04 PM 03/22/2022    8:18 PM  Last 3 Weights  Weight (lbs) 140 lb 146 lb 130 lb  Weight (kg) 63.504 kg 66.225 kg 58.968 kg     Body mass index is 19.53 kg/m.  General: Thin female appearing in no acute distress.  HEENT: normal Neck: no JVD Vascular: No carotid bruits; Distal pulses 2+ bilaterally Cardiac:  normal S1, S2; RRR; no murmur  Lungs:  clear to auscultation bilaterally, no wheezing, rhonchi or rales  Abd: soft, nontender, no hepatomegaly  Ext: no pitting edema Skin: warm and dry  Neuro:  CNs 2-12 intact, no focal abnormalities noted Psych:  Normal affect   EKG:  The EKG was personally reviewed and demonstrates: NSR, HR 79 with upsloping of the ST segment along the inferior and precordial leads.   Telemetry:  Telemetry was personally reviewed and demonstrates: SVT on 03/29/2023 for approximately 30 minutes and converted after a 1.9 sec pause. Maintained NSR since with HR in the 60's to 70's.   Relevant CV Studies:  Echocardiogram: 01/2023  IMPRESSIONS     1. Severely thickened speckled like appearing myocardium, thickened heart  valves, diastolic dysfunction. Consider possible cardiac amyloidosis,  consider cMRI for better evaluation. . Left ventricular  ejection fraction,  by estimation, is 60 to 65%. The  left ventricle has normal function. The left ventricle has no regional  wall motion abnormalities. There is moderate left ventricular hypertrophy.  Left ventricular diastolic parameters are consistent with Grade II  diastolic dysfunction  (pseudonormalization). Elevated left atrial pressure.   2. Right ventricular systolic function is normal. The right ventricular  size is normal. There is mildly elevated pulmonary artery systolic  pressure.   3. Left atrial size was mildly dilated.   4. Right atrial size was mildly dilated.   5. The mitral valve is abnormal. No evidence of mitral valve  regurgitation. Mild mitral stenosis. Moderate mitral annular  calcification.   6. The tricuspid valve is abnormal. Tricuspid valve regurgitation is mild  to moderate.   7. The aortic valve is tricuspid. There is mild calcification of the  aortic valve. There is mild thickening of the aortic valve. Aortic valve  regurgitation is not visualized. No aortic stenosis is present.   8. The inferior vena cava is dilated in size with >50% respiratory  variability, suggesting right atrial pressure of 8 mmHg.    Laboratory Data:  High Sensitivity Troponin:   Recent Labs  Lab 03/29/23 1851 03/29/23 2112  TROPONINIHS 6 6     Chemistry Recent Labs  Lab 03/29/23 0230 03/29/23 0651 03/30/23 0409  NA 131* 132* 134*  K 4.1 4.3 3.7  CL 95* 97* 99  CO2 25 25 27   GLUCOSE 412* 386* 51*  BUN 22 23 26*  CREATININE 0.99 0.84 0.62  CALCIUM 9.2 9.1 8.6*  MG  --  1.4* 1.7  GFRNONAA >60 >60 >60  ANIONGAP 11 10 8     Recent Labs  Lab 03/29/23 0651  PROT 7.7  ALBUMIN 3.2*  AST 16  ALT 13  ALKPHOS 76  BILITOT 1.2   Lipids No results for input(s): "CHOL", "TRIG", "HDL", "LABVLDL", "LDLCALC", "CHOLHDL" in the last 168 hours.  Hematology  Recent Labs  Lab 03/29/23 0230 03/29/23 0651 03/30/23 0409  WBC 20.1* 13.8* 7.9  RBC 4.13 3.98 3.32*  HGB 13.9  13.2 11.0*  HCT 40.5 39.4 33.5*  MCV 98.1 99.0 100.9*  MCH 33.7 33.2 33.1  MCHC 34.3 33.5 32.8  RDW 13.1 13.2 13.3  PLT 321 258 245   Thyroid No results for input(s): "TSH", "FREET4" in the last 168 hours.  BNPNo results for input(s): "BNP", "PROBNP" in the last 168 hours.  DDimer No results for input(s): "DDIMER" in the last 168 hours.   Radiology/Studies:  DG Chest Port 1 View  Result Date: 03/29/2023 CLINICAL DATA:  Preop EXAM: PORTABLE CHEST 1 VIEW COMPARISON:  CT chest dated 03/13/2023 FINDINGS: Increased interstitial markings in the lungs bilaterally, lower lobe predominant, reflecting subpleural reticulation/fibrosis when correlating with prior CT. No superimposed opacity suspicious for pneumonia. No pleural effusion or pneumothorax. The heart is normal in size.  Thoracic aortic atherosclerosis. IMPRESSION: Chronic interstitial lung disease/fibrosis. No acute cardiopulmonary disease. Electronically Signed   By: Charline Bills M.D.   On: 03/29/2023 03:02   DG Femur Portable Min 2 Views Right  Result Date: 03/29/2023 CLINICAL DATA:  Fall, right femur deformity EXAM: RIGHT FEMUR PORTABLE 2 VIEW COMPARISON:  None Available. FINDINGS: Right hip and visualized bony pelvis are intact. Comminuted distal femoral shaft fracture. Knee is displaced 1 full shaft width medially relative to the distal femoral shaft. Mild apex posterior angulation. Associated soft tissue swelling with obvious deformity. IMPRESSION: Comminuted, displaced distal femoral shaft fracture, as described above. Electronically Signed   By: Charline Bills M.D.   On: 03/29/2023 03:01     Assessment and Plan:   1. Preoperative Cardiac Clearance for Hip Surgery - She has no known history of CAD or CHF and echocardiogram in 01/2023 showed a preserved EF with no regional wall motion abnormalities. There were concerns for possible cardiac amyloid as discussed below. She is not very active at baseline but denies any recent  anginal symptoms. She would not require further cardiac testing prior to surgery and would be of acceptable risk to proceed given the urgency of this. RCRI Risk is at 0.9% of a major cardiac event. In regards to her arrhythmia, anticipate that Adenosine can be administered again if she does have recurrent SVT as she is at high-risk for recurrence given her recent cocaine use.  2. SVT - She has a known history of this which has been documented during prior admissions. She did convert back to NSR with Adenosine yesterday. Suspect her recurrent arrhythmias have been triggered by her continued substance use and this was reviewed with the patient today. - Lopressor is not ideal given her cocaine use and I am not sure that her BP would allow for the use of Coreg (BP has been at 88/66 to 128/75 within the past 24 hours, at 100/68 on most recent check). She has been receiving a significant amount of pain medication in the form of IV Morphine along with PO Oxycodone which could be impacting her BP as well. Could consider initiation of Coreg following BP trend. Will check TSH. Keep K+ ~ 4.0 and Mg ~ 2.0. Will order replacement.   3. Abnormal Echocardiogram - Echo in 01/2023 showed a severely thickened speckled like appearing myocardium, thickened heart valves and diastolic dysfunction which was concerning for possible cardiac amyloidosis with cMRI recommended for further evaluation.  - Can consider obtaining as an outpatient if compliant with follow-up. Doubt she would be a candidate for advanced  therapies at this time given her continued substance abuse.   4. Substance Abuse - She reports a longstanding history of substance abuse and says she did use cocaine the evening prior to admission. Cessation advised.    For questions or updates, please contact [facility] Please consult [email or web address] for contact info under    Signed, Ellsworth Lennox, PA-C  03/30/2023 9:46 AM   Attending  note:  Patient seen and examined.  I reviewed her records and discussed the case with Miranda Sampson, I agree with her above findings.  Miranda Sampson is currently admitted to the hospital after a mechanical fall with resultant comminuted displaced distal femoral shaft fracture and plan for operative repair most likely with intramedullary nail today.  Preoperative cardiac clearance is requested in light of episode of recurrent SVT that was treated with adenosine last evening.  She has a known history of PSVT, last documented recurrence was in November 2023 at the time of pneumonia diagnosis.  She unfortunately has a history of recurrent substance abuse including cocaine, and in fact used cocaine on the evening prior to admission which could certainly have contributed to her episode of SVT.  On examination she reports no chest pain or palpitations.  She is afebrile, heart rate in the 60s and 70s in sinus rhythm.  Blood pressure 100/68.  Lungs are clear, cardiac exam with RRR and no gallop, no significant murmur.  Pertinent lab work includes potassium 3.7, BUN 26, creatinine 0.62, normal high-sensitivity troponin I levels, hemoglobin 11.0, platelets 245.  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  Echocardiogram from February revealed LVEF 60 to 65% with myocardial appearance raising possibility of cardiac amyloidosis, moderate diastolic dysfunction, mildly estimated RVSP, mildly thickened aortic valve and mild biatrial enlargement.  This is something that can be investigated further as an outpatient with additional cardiac imaging studies presuming she maintains regular follow-up.  In terms of specific perioperative cardiac clearance, RCRI risk index is 0.9% chance of major adverse cardiac event.  She is at risk for recurrent PSVT given her history, this has been successfully managed with adenosine on prior episodes  and would not be a specific reason to cancel surgery.  Bigger picture however is recent cocaine use and how this would impact choice of anesthesia.  Jonelle Sidle, M.D., F.A.C.C.

## 2023-03-30 NOTE — Progress Notes (Signed)
ORTHOPAEDIC PROGRESS NOTE  Scheduled for Procedure(s): Retrograde femoral nail for comminuted distal femur fracture   DOS: rescheduled for 03/31/2023  SUBJECTIVE: Pain is controlled.  She is comfortable.  Runs of SVT over night.    OBJECTIVE: PE:  Resting.  Easily awakens  Knee immobilizer on right knee Swelling about the knee Sensation intact distally.  Toes are warm and well perfused.   Vitals:   03/30/23 1646 03/30/23 2019  BP: 119/64 113/68  Pulse: 81 77  Resp: 20 20  Temp: 99.1 F (37.3 C) 98.9 F (37.2 C)  SpO2: 95% 100%      Latest Ref Rng & Units 03/30/2023    4:09 AM 03/29/2023    6:51 AM 03/29/2023    2:30 AM  CBC  WBC 4.0 - 10.5 K/uL 7.9  13.8  20.1   Hemoglobin 12.0 - 15.0 g/dL 11.9  14.7  82.9   Hematocrit 36.0 - 46.0 % 33.5  39.4  40.5   Platelets 150 - 400 K/uL 245  258  321      ASSESSMENT: Miranda Sampson is a 72 y.o. female cardiac issues over night, UDS + for cocaine.  Case will be moved to 03/31/2023.  Discussed with patient.   PLAN: Weightbearing: NWB RLE Insicional and dressing care: Reinforce dressings as needed; none currently Orthopedic device(s):  Knee immobilizer VTE prophylaxis: None currently, please hold until POD#1 Pain control: PRN medications, judicious use of narcotics Follow - up plan: 2 weeks postop   Contact information:     Marymargaret Kirker A. Dallas Schimke, MD MS [facility] [address] [place],  [place]  [address] Phone: [phone] Fax: [phone]

## 2023-03-31 ENCOUNTER — Inpatient Hospital Stay (HOSPITAL_COMMUNITY): Payer: 59 | Admitting: Anesthesiology

## 2023-03-31 ENCOUNTER — Encounter (HOSPITAL_COMMUNITY)
Admission: EM | Disposition: A | Discharge status: Skilled Nursing Facility | Payer: Self-pay | Source: Home / Self Care | Attending: Internal Medicine

## 2023-03-31 ENCOUNTER — Inpatient Hospital Stay (HOSPITAL_COMMUNITY): Payer: 59

## 2023-03-31 ENCOUNTER — Encounter (HOSPITAL_COMMUNITY): Payer: Self-pay | Admitting: Family Medicine

## 2023-03-31 ENCOUNTER — Other Ambulatory Visit: Payer: Self-pay

## 2023-03-31 DIAGNOSIS — Z794 Long term (current) use of insulin: Secondary | ICD-10-CM

## 2023-03-31 DIAGNOSIS — E119 Type 2 diabetes mellitus without complications: Secondary | ICD-10-CM | POA: Diagnosis not present

## 2023-03-31 DIAGNOSIS — E1065 Type 1 diabetes mellitus with hyperglycemia: Secondary | ICD-10-CM

## 2023-03-31 DIAGNOSIS — I959 Hypotension, unspecified: Secondary | ICD-10-CM | POA: Diagnosis not present

## 2023-03-31 DIAGNOSIS — I471 Supraventricular tachycardia, unspecified: Secondary | ICD-10-CM

## 2023-03-31 DIAGNOSIS — I10 Essential (primary) hypertension: Secondary | ICD-10-CM

## 2023-03-31 DIAGNOSIS — F1410 Cocaine abuse, uncomplicated: Secondary | ICD-10-CM | POA: Diagnosis not present

## 2023-03-31 DIAGNOSIS — S72401A Unspecified fracture of lower end of right femur, initial encounter for closed fracture: Secondary | ICD-10-CM

## 2023-03-31 DIAGNOSIS — S72351A Displaced comminuted fracture of shaft of right femur, initial encounter for closed fracture: Secondary | ICD-10-CM | POA: Diagnosis not present

## 2023-03-31 HISTORY — PX: FEMUR IM NAIL: SHX1597

## 2023-03-31 LAB — CBC
HCT: 31.7 % — ABNORMAL LOW (ref 36.0–46.0)
Hemoglobin: 10.4 g/dL — ABNORMAL LOW (ref 12.0–15.0)
MCH: 33.4 pg (ref 26.0–34.0)
MCHC: 32.8 g/dL (ref 30.0–36.0)
MCV: 101.9 fL — ABNORMAL HIGH (ref 80.0–100.0)
Platelets: 228 10*3/uL (ref 150–400)
RBC: 3.11 MIL/uL — ABNORMAL LOW (ref 3.87–5.11)
RDW: 13.5 % (ref 11.5–15.5)
WBC: 8.4 10*3/uL (ref 4.0–10.5)
nRBC: 0 % (ref 0.0–0.2)

## 2023-03-31 LAB — BASIC METABOLIC PANEL
Anion gap: 6 (ref 5–15)
BUN: 19 mg/dL (ref 8–23)
CO2: 27 mmol/L (ref 22–32)
Calcium: 8.2 mg/dL — ABNORMAL LOW (ref 8.9–10.3)
Chloride: 100 mmol/L (ref 98–111)
Creatinine, Ser: 0.51 mg/dL (ref 0.44–1.00)
GFR, Estimated: >60 mL/min (ref >60)
Glucose, Bld: 83 mg/dL (ref 70–99)
Potassium: 4.1 mmol/L (ref 3.5–5.1)
Sodium: 133 mmol/L — ABNORMAL LOW (ref 135–145)

## 2023-03-31 LAB — GLUCOSE, CAPILLARY
Glucose-Capillary: 81 mg/dL (ref 70–99)
Glucose-Capillary: 74 mg/dL (ref 70–99)
Glucose-Capillary: 94 mg/dL (ref 70–99)
Glucose-Capillary: 183 mg/dL — ABNORMAL HIGH (ref 70–99)
Glucose-Capillary: 175 mg/dL — ABNORMAL HIGH (ref 70–99)
Glucose-Capillary: 429 mg/dL — ABNORMAL HIGH (ref 70–99)

## 2023-03-31 LAB — MAGNESIUM: Magnesium: 1.7 mg/dL (ref 1.7–2.4)

## 2023-03-31 LAB — TSH: TSH: 1.596 u[IU]/mL (ref 0.350–4.500)

## 2023-03-31 SURGERY — INSERTION, INTRAMEDULLARY ROD, FEMUR, RETROGRADE
Anesthesia: General | Site: Knee | Laterality: Right

## 2023-03-31 MED ORDER — DEXMEDETOMIDINE HCL IN NACL 80 MCG/20ML IV SOLN
INTRAVENOUS | Status: AC
  Filled 2023-03-31: qty 20

## 2023-03-31 MED ORDER — ORAL CARE MOUTH RINSE: 15.0000 mL | Freq: Once | OROMUCOSAL | Status: AC

## 2023-03-31 MED ORDER — BUPIVACAINE HCL (PF) 0.5 % IJ SOLN
INTRAMUSCULAR | Status: AC
  Filled 2023-03-31: qty 30

## 2023-03-31 MED ORDER — FENTANYL CITRATE (PF) 100 MCG/2ML IJ SOLN
INTRAMUSCULAR | Status: DC | PRN
  Administered 2023-03-31 (×3): 25 ug via INTRAVENOUS

## 2023-03-31 MED ORDER — LIDOCAINE HCL (PF) 1 % IJ SOLN
INTRAMUSCULAR | Status: DC | PRN
  Administered 2023-03-31: 2.5 mL

## 2023-03-31 MED ORDER — ONDANSETRON HCL 4 MG/2ML IJ SOLN
INTRAMUSCULAR | Status: DC | PRN
  Administered 2023-03-31: 4 mg via INTRAVENOUS

## 2023-03-31 MED ORDER — METOCLOPRAMIDE HCL 5 MG/ML IJ SOLN
10.0000 mg | Freq: Once | INTRAMUSCULAR | Status: AC
  Administered 2023-03-31: 10 mg via INTRAVENOUS

## 2023-03-31 MED ORDER — PROPOFOL 10 MG/ML IV BOLUS
INTRAVENOUS | Status: DC | PRN
  Administered 2023-03-31: 30 mg via INTRAVENOUS
  Administered 2023-03-31: 20 mg via INTRAVENOUS

## 2023-03-31 MED ORDER — PROPOFOL 10 MG/ML IV BOLUS
INTRAVENOUS | Status: AC
  Filled 2023-03-31: qty 20

## 2023-03-31 MED ORDER — MIDAZOLAM HCL 2 MG/2ML IJ SOLN
INTRAMUSCULAR | Status: AC
  Filled 2023-03-31: qty 2

## 2023-03-31 MED ORDER — DEXTROSE 50 % IV SOLN
INTRAVENOUS | Status: AC
  Filled 2023-03-31: qty 50

## 2023-03-31 MED ORDER — CEFAZOLIN SODIUM-DEXTROSE 2-3 GM-%(50ML) IV SOLR
INTRAVENOUS | Status: DC | PRN
  Administered 2023-03-31: 2 g via INTRAVENOUS

## 2023-03-31 MED ORDER — LACTATED RINGERS IV SOLN: INTRAVENOUS | Status: DC

## 2023-03-31 MED ORDER — DEXTROSE 50 % IV SOLN
25.0000 mL | Freq: Once | INTRAVENOUS | Status: AC
  Administered 2023-03-31: 25 mL via INTRAVENOUS

## 2023-03-31 MED ORDER — EPHEDRINE SULFATE (PRESSORS) 50 MG/ML IJ SOLN
INTRAMUSCULAR | Status: DC | PRN
  Administered 2023-03-31: 5 mg via INTRAVENOUS

## 2023-03-31 MED ORDER — HYDROMORPHONE HCL 1 MG/ML IJ SOLN: 0.2500 mg | INTRAMUSCULAR | Status: DC | PRN

## 2023-03-31 MED ORDER — PHENYLEPHRINE HCL-NACL 20-0.9 MG/250ML-% IV SOLN
INTRAVENOUS | Status: DC | PRN
  Administered 2023-03-31: 50 ug/min via INTRAVENOUS

## 2023-03-31 MED ORDER — STERILE WATER FOR IRRIGATION IR SOLN
Status: DC | PRN
  Administered 2023-03-31: 1000 mL

## 2023-03-31 MED ORDER — SUGAMMADEX SODIUM 200 MG/2ML IV SOLN
INTRAVENOUS | Status: DC | PRN
  Administered 2023-03-31: 200 mg via INTRAVENOUS

## 2023-03-31 MED ORDER — BUPIVACAINE-EPINEPHRINE (PF) 0.25% -1:200000 IJ SOLN
INTRAMUSCULAR | Status: AC
  Filled 2023-03-31: qty 30

## 2023-03-31 MED ORDER — PHENYLEPHRINE HCL (PRESSORS) 10 MG/ML IV SOLN
INTRAVENOUS | Status: DC | PRN
  Administered 2023-03-31 (×4): 80 ug via INTRAVENOUS
  Administered 2023-03-31 (×2): 100 ug via INTRAVENOUS
  Administered 2023-03-31: 80 ug via INTRAVENOUS

## 2023-03-31 MED ORDER — DEXTROSE-NACL 5-0.9 % IV SOLN: INTRAVENOUS | Status: DC | PRN

## 2023-03-31 MED ORDER — CEFAZOLIN SODIUM-DEXTROSE 2-4 GM/100ML-% IV SOLN
INTRAVENOUS | Status: AC
  Filled 2023-03-31: qty 100

## 2023-03-31 MED ORDER — CHLORHEXIDINE GLUCONATE 0.12 % MT SOLN
15.0000 mL | Freq: Once | OROMUCOSAL | Status: AC
  Administered 2023-03-31: 15 mL via OROMUCOSAL

## 2023-03-31 MED ORDER — DEXMEDETOMIDINE HCL IN NACL 80 MCG/20ML IV SOLN
INTRAVENOUS | Status: DC | PRN
  Administered 2023-03-31: 4 ug via BUCCAL
  Administered 2023-03-31 (×3): 8 ug via BUCCAL

## 2023-03-31 MED ORDER — CEFAZOLIN SODIUM-DEXTROSE 2-4 GM/100ML-% IV SOLN
2.0000 g | Freq: Three times a day (TID) | INTRAVENOUS | Status: AC
  Administered 2023-03-31 – 2023-04-01 (×3): 2 g via INTRAVENOUS
  Filled 2023-03-31 (×3): qty 100

## 2023-03-31 MED ORDER — SODIUM CHLORIDE 0.9 % IR SOLN
Status: DC | PRN
  Administered 2023-03-31: 1000 mL

## 2023-03-31 MED ORDER — DEXAMETHASONE SODIUM PHOSPHATE 10 MG/ML IJ SOLN
INTRAMUSCULAR | Status: DC | PRN
  Administered 2023-03-31: 10 mg via INTRAVENOUS

## 2023-03-31 MED ORDER — BUPIVACAINE HCL (PF) 0.5 % IJ SOLN
INTRAMUSCULAR | Status: DC | PRN
  Administered 2023-03-31: 30 mL

## 2023-03-31 MED ORDER — TRANEXAMIC ACID-NACL 1000-0.7 MG/100ML-% IV SOLN
INTRAVENOUS | Status: DC | PRN
  Administered 2023-03-31: 1000 mg via INTRAVENOUS

## 2023-03-31 MED ORDER — ROCURONIUM BROMIDE 100 MG/10ML IV SOLN
INTRAVENOUS | Status: DC | PRN
  Administered 2023-03-31: 50 mg via INTRAVENOUS

## 2023-03-31 MED ORDER — METOCLOPRAMIDE HCL 5 MG/ML IJ SOLN
INTRAMUSCULAR | Status: AC
  Filled 2023-03-31: qty 2

## 2023-03-31 MED ORDER — FENTANYL CITRATE (PF) 250 MCG/5ML IJ SOLN
INTRAMUSCULAR | Status: AC
  Filled 2023-03-31: qty 5

## 2023-03-31 SURGICAL SUPPLY — 67 items
APL PRP STRL LF DISP 70% ISPRP (MISCELLANEOUS) ×1
BIT DRILL 4.0X165 AO STYLE (BIT) IMPLANT
BIT DRILL CALIBRATED AO 5.5 (DRILL) IMPLANT
BLADE HEX COATED 2.75 (ELECTRODE) ×2 IMPLANT
BLADE SURG SZ10 CARB STEEL (BLADE) ×4 IMPLANT
BNDG COHESIVE 4X5 TAN STRL (GAUZE/BANDAGES/DRESSINGS) ×2 IMPLANT
CHLORAPREP W/TINT 26 (MISCELLANEOUS) ×2 IMPLANT
CLOTH BEACON ORANGE TIMEOUT ST (SAFETY) ×2 IMPLANT
COUNTER NDL MAGNETIC 40 RED (SET/KITS/TRAYS/PACK) IMPLANT
COUNTER NEEDLE MAGNETIC 40 RED (SET/KITS/TRAYS/PACK) ×1 IMPLANT
COVER LIGHT HANDLE STERIS (MISCELLANEOUS) ×4 IMPLANT
COVER MAYO STAND XLG (MISCELLANEOUS) ×2 IMPLANT
DECANTER SPIKE VIAL GLASS SM (MISCELLANEOUS) ×2 IMPLANT
DRAPE C-ARM FOLDED MOBILE STRL (DRAPES) ×2 IMPLANT
DRAPE C-ARMOR (DRAPES) ×2 IMPLANT
DRAPE HALF SHEET 40X57 (DRAPES) ×8 IMPLANT
DRAPE HIP W/POCKET STRL (MISCELLANEOUS) ×2 IMPLANT
DRAPE ORTHO 2.5IN SPLIT 77X108 (DRAPES) ×4 IMPLANT
DRAPE ORTHO SPLIT 77X108 STRL (DRAPES) ×2
DRILL CALIBRATED AO 5.5 (DRILL) ×1
DRSG TEGADERM 4X4.75 (GAUZE/BANDAGES/DRESSINGS) IMPLANT
ELECT REM PT RETURN 9FT ADLT (ELECTROSURGICAL) ×1
ELECTRODE REM PT RTRN 9FT ADLT (ELECTROSURGICAL) ×2 IMPLANT
GAUZE SPONGE 4X4 12PLY STRL (GAUZE/BANDAGES/DRESSINGS) IMPLANT
GLOVE BIO SURGEON STRL SZ7 (GLOVE) IMPLANT
GLOVE BIO SURGEON STRL SZ8 (GLOVE) ×6 IMPLANT
GLOVE BIOGEL PI IND STRL 7.0 (GLOVE) ×4 IMPLANT
GLOVE BIOGEL PI IND STRL 8 (GLOVE) ×2 IMPLANT
GLOVE SRG 8 PF TXTR STRL LF DI (GLOVE) ×2 IMPLANT
GLOVE SURG UNDER POLY LF SZ8 (GLOVE) ×1
GOWN STRL REUS W/ TWL XL LVL3 (GOWN DISPOSABLE) ×2 IMPLANT
GOWN STRL REUS W/TWL LRG LVL3 (GOWN DISPOSABLE) ×4 IMPLANT
GOWN STRL REUS W/TWL XL LVL3 (GOWN DISPOSABLE) ×1
GUIDEWIRE BALL NOSE 3.0X900 (WIRE) ×1
GUIDEWIRE ORTH 900X3XBALL NOSE (WIRE) IMPLANT
INST SET MAJOR BONE (KITS) ×2 IMPLANT
KIT TURNOVER KIT A (KITS) ×2 IMPLANT
MANIFOLD NEPTUNE II (INSTRUMENTS) ×2 IMPLANT
MARKER SKIN DUAL TIP RULER LAB (MISCELLANEOUS) ×2 IMPLANT
NAIL FEM RETROGRADE 13X40 (Nail) IMPLANT
NDL HYPO 21X1.5 SAFETY (NEEDLE) ×2 IMPLANT
NEEDLE HYPO 21X1.5 SAFETY (NEEDLE) ×2 IMPLANT
NS IRRIG 1000ML POUR BTL (IV SOLUTION) ×2 IMPLANT
PACK BASIC III (CUSTOM PROCEDURE TRAY) ×1
PACK SRG BSC III STRL LF ECLPS (CUSTOM PROCEDURE TRAY) ×2 IMPLANT
PACK TOTAL JOINT (CUSTOM PROCEDURE TRAY) ×2 IMPLANT
PAD ARMBOARD 7.5X6 YLW CONV (MISCELLANEOUS) ×2 IMPLANT
PENCIL SMOKE EVACUATOR COATED (MISCELLANEOUS) ×2 IMPLANT
PIN GUIDE THRD AR 3.2X330 (PIN) IMPLANT
SCREW CANC CAPT FT 6.5X75 (Plate) IMPLANT
SCREW CORT CAPT 5.0X40 (Screw) IMPLANT
SCREW CORT CAPT FT 6.5X90 (Screw) IMPLANT
SET BASIN LINEN APH (SET/KITS/TRAYS/PACK) ×2 IMPLANT
SPONGE T-LAP 18X18 ~~LOC~~+RFID (SPONGE) ×4 IMPLANT
STOCKINETTE IMPERVIOUS LG (DRAPES) ×2 IMPLANT
STRIP CLOSURE SKIN 1/2X4 (GAUZE/BANDAGES/DRESSINGS) IMPLANT
SUT BRALON NAB BRD #1 30IN (SUTURE) IMPLANT
SUT MNCRL AB 4-0 PS2 18 (SUTURE) ×2 IMPLANT
SUT MON AB 0 CT1 (SUTURE) IMPLANT
SUT MON AB 2-0 CT1 36 (SUTURE) ×4 IMPLANT
SUT VICRYL 0 AB UR-6 (SUTURE) IMPLANT
SYR 30ML LL (SYRINGE) IMPLANT
SYR BULB IRRIG 60ML STRL (SYRINGE) ×4 IMPLANT
TOWEL OR 17X26 4PK STRL BLUE (TOWEL DISPOSABLE) IMPLANT
WATER STERILE IRR 1000ML POUR (IV SOLUTION) ×4 IMPLANT
YANKAUER SUCT 12FT TUBE ARGYLE (SUCTIONS) ×2 IMPLANT
YANKAUER SUCT BULB TIP 10FT TU (MISCELLANEOUS) ×2 IMPLANT

## 2023-03-31 NOTE — Transfer of Care (Signed)
Immediate Anesthesia Transfer of Care Note  Patient: Miranda Sampson  Procedure(s) Performed: INTRAMEDULLARY (IM) RETROGRADE FEMORAL NAILING (Right: Knee)  Patient Location: PACU  Anesthesia Type:General  Level of Consciousness: awake and patient cooperative  Airway & Oxygen Therapy: Patient Spontanous Breathing and Patient connected to nasal cannula oxygen  Post-op Assessment: Report given to RN and Post -op Vital signs reviewed and stable  Post vital signs: Reviewed and stable  Last Vitals:  Vitals Value Taken Time  BP 91/60 03/31/23 1518  Temp 97.5 03/31/23 1520  Pulse 80 03/31/23 1520  Resp 17 03/31/23 1520  SpO2 93 % 03/31/23 1520  Vitals shown include unvalidated device data.  Last Pain:  Vitals:   03/31/23 1125  TempSrc: Oral  PainSc: 0-No pain         Complications: No notable events documented.

## 2023-03-31 NOTE — Addendum Note (Signed)
Addendum  created 03/31/23 1547 by Franco Nones, CRNA   Child order released for a procedure order, Order Canceled from Note

## 2023-03-31 NOTE — Progress Notes (Signed)
[facility]  PROGRESS NOTE  Miranda Sampson ZOX:[id number] DOB: 08-May-1951 DOA: 03/29/2023 PCP: Benetta Spar, MD   Brief HPI:   72 y.o. female with medical history significant of allergies, depression, diabetes mellitus, GERD, hyperlipidemia, substance abuse, and more presents the ED with a chief complaint of fall.  She was admitted for displaced comminuted fracture of right femoral shaft.  Orthopedics to evaluate with plans for operation on Friday with delayed due to need for cardiology clearance and noted SVT.     Subjective   Patient seen and examined, no new complaints.  Plan for surgery today.   Assessment/Plan:    Displaced comminuted fracture of shaft of right femur, initial encounter for closed fracture -S/p mechanical fall; x-ray showed comminuted displaced distal femoral shaft fracture -Cardiology was consulted for preoperative cardiac clearance and SVT likely in setting of cocaine use -She was cleared by cardiology for surgery today   SVT -Known history of SVT and required adenosine 4/17, likely triggered by cocaine use  -Cardiology was consulted, not started on beta-blocker due to recent cocaine use -Recommend to keep potassium more than 4.0, magnesium more than 2.0 -TSH 1.596   Hypotension -Resolved -Initially presented with hypotension, vasovagal? -1 L fluid bolus given in the ED -Continue monitoring  Cocaine abuse - Counseled on importance of cessation - UDS ordered and pending - TOC consult   Hyperlipidemia - Continue statin   Diabetes mellitus - Continue SSI -Semglee 30 units at bedtime -Hemoglobin A1c 8.5% 10/2022 -CBG well-controlled     Medications     atorvastatin  20 mg Oral Daily   dextrose       gabapentin  100 mg Oral QHS   insulin aspart  0-15 Units Subcutaneous TID WC   insulin aspart  0-5 Units Subcutaneous QHS   insulin glargine-yfgn  30 Units Subcutaneous QHS     Data Reviewed:   CBG:  Recent Labs  Lab  03/31/23 0645 03/31/23 1050 03/31/23 1216 03/31/23 1529 03/31/23 1617  GLUCAP 81 74 94 183* 175*    SpO2: 97 % O2 Flow Rate (L/min): 2 L/min    Vitals:   03/31/23 1530 03/31/23 1545 03/31/23 1554 03/31/23 1609  BP: 110/66 115/65  109/73  Pulse: 79 78  80  Resp: Temp:   98.1 F (36.7 C) 98.4 F (36.9 C)  TempSrc:    Oral  SpO2: 100% 100% 99% 97%  Weight:      Height:          Data Reviewed:  Basic Metabolic Panel: Recent Labs  Lab 03/29/23 0230 03/29/23 0651 03/30/23 0409 03/31/23 0513  NA 131* 132* 134* 133*  K 4.1 4.3 3.7 4.1  CL 95* 97* 99 100  CO2 GLUCOSE 412* 386* 51* 83  BUN 22 23 26* 19  CREATININE 0.99 0.84 0.62 0.51  CALCIUM 9.2 9.1 8.6* 8.2*  MG  --  1.4* 1.7 1.7    CBC: Recent Labs  Lab 03/29/23 0230 03/29/23 0651 03/30/23 0409 03/31/23 0513  WBC 20.1* 13.8* 7.9 8.4  NEUTROABS 17.5* 11.6*  --   --   HGB 13.9 13.2 11.0* 10.4*  HCT 40.5 39.4 33.5* 31.7*  MCV 98.1 99.0 100.9* 101.9*  PLT 321 258 245 228    LFT Recent Labs  Lab 03/29/23 0651  AST 16  ALT 13  ALKPHOS 76  BILITOT 1.2  PROT 7.7  ALBUMIN 3.2*     Antibiotics: Anti-infectives (From admission,  onward)    Start     Dose/Rate Route Frequency Ordered Stop   03/31/23 2200  ceFAZolin (ANCEF) IVPB 2g/100 mL premix        2 g 200 mL/hr over 30 Minutes Intravenous Every 8 hours 03/31/23 1609 04/01/23 2159   03/31/23 1132  ceFAZolin (ANCEF) 2-4 GM/100ML-% IVPB       Note to Pharmacy: Leota Jacobsen: cabinet override      03/31/23 1132 03/31/23 2344   03/30/23 1200  ceFAZolin (ANCEF) IVPB 2g/100 mL premix        2 g 200 mL/hr over 30 Minutes Intravenous On call to O.R. 03/29/23 0350 03/31/23 0559        DVT prophylaxis: SCDs  Code Status: Full code  Family Communication: No family at bedside   CONSULTS orthopedics   Objective    Physical Examination:   General-appears in no acute distress Heart-S1-S2, regular, no murmur  auscultated Lungs-clear to auscultation bilaterally, no wheezing or crackles auscultated Abdomen-soft, nontender, no organomegaly Extremities-no edema in the lower extremities Neuro-alert, oriented x3, no focal deficit noted  Status is: Inpatient:             Meredeth Ide   [facility] If 7PM-7AM, please contact night-coverage at [email or web address], Office  [phone]   03/31/2023, 5:44 PM  LOS: 2 days

## 2023-03-31 NOTE — Progress Notes (Signed)
Dr McDowell's consult note reviewed, no additional cardiology recs at this time. Tele overnight without any recurrent arrhythmias. Plans for ortho surgery today  We will sign off inpatient care, please call back with questions.   Dominga Ferry MD

## 2023-03-31 NOTE — Anesthesia Postprocedure Evaluation (Signed)
Anesthesia Post Note  Patient: Miranda Sampson  Procedure(s) Performed: INTRAMEDULLARY (IM) RETROGRADE FEMORAL NAILING (Right: Knee)  Patient location during evaluation: PACU Anesthesia Type: Combined General/Spinal Level of consciousness: awake and alert and oriented Pain management: pain level controlled Vital Signs Assessment: post-procedure vital signs reviewed and stable Respiratory status: spontaneous breathing, nonlabored ventilation, respiratory function stable and patient connected to nasal cannula oxygen Cardiovascular status: blood pressure returned to baseline and stable Postop Assessment: no apparent nausea or vomiting Anesthetic complications: no  No notable events documented.   Last Vitals:  Vitals:   03/31/23 1130 03/31/23 1518  BP: (!) 97/57 91/60  Pulse:  85  Resp:  14  Temp:    SpO2:      Last Pain:  Vitals:   03/31/23 1125  TempSrc: Oral  PainSc: 0-No pain                 Dawit Tankard C Jacy Howat

## 2023-03-31 NOTE — Anesthesia Procedure Notes (Signed)
Date/Time: 03/31/2023 12:32 PM  Performed by: Franco Nones, CRNAPre-anesthesia Checklist: Patient identified, Emergency Drugs available, Suction available, Timeout performed and Patient being monitored Patient Re-evaluated:Patient Re-evaluated prior to induction Oxygen Delivery Method: Nasal Cannula

## 2023-03-31 NOTE — Anesthesia Preprocedure Evaluation (Addendum)
Anesthesia Evaluation  Patient identified by MRN, date of birth, ID band Patient awake    Reviewed: Allergy & Precautions, H&P , NPO status , Patient's Chart, lab work & pertinent test results  Airway Mallampati: II  TM Distance: >3 FB Neck ROM: Full    Dental  (+) Edentulous Upper, Edentulous Lower   Pulmonary shortness of breath and with exertion, pneumonia IMPRESSION: 1. Moderate severity pulmonary fibrosis 2. 5 mm nonobstructing right renal calculus. 3. Multiple calcified and noncalcified heterogeneous uterine fibroids. 4. Marked severity degenerative changes at the levels of L2-L3 and L4-L5. 5. Aortic atherosclerosis.   Aortic Atherosclerosis (ICD10-I70.0).     Electronically Signed   By: Aram Candela M.D.   On: 03/23/2022 01:42    Pulmonary exam normal breath sounds clear to auscultation       Cardiovascular Exercise Tolerance: Poor hypertension, Pt. on medications + dysrhythmias Supra Ventricular Tachycardia  Rhythm:Regular Rate:Normal   1. Severely thickened speckled like appearing myocardium, thickened heart  valves, diastolic dysfunction. Consider possible cardiac amyloidosis,  consider cMRI for better evaluation. . Left ventricular ejection fraction,  by estimation, is 60 to 65%. The  left ventricle has normal function. The left ventricle has no regional  wall motion abnormalities. There is moderate left ventricular hypertrophy.  Left ventricular diastolic parameters are consistent with Grade II  diastolic dysfunction  (pseudonormalization). Elevated left atrial pressure.   2. Right ventricular systolic function is normal. The right ventricular  size is normal. There is mildly elevated pulmonary artery systolic  pressure.   3. Left atrial size was mildly dilated.   4. Right atrial size was mildly dilated.   5. The mitral valve is abnormal. No evidence of mitral valve  regurgitation. Mild mitral  stenosis. Moderate mitral annular  calcification.   6. The tricuspid valve is abnormal. Tricuspid valve regurgitation is mild  to moderate.   7. The aortic valve is tricuspid. There is mild calcification of the  aortic valve. There is mild thickening of the aortic valve. Aortic valve  regurgitation is not visualized. No aortic stenosis is present.   8. The inferior vena cava is dilated in size with >50% respiratory  variability, suggesting right atrial pressure of 8 mmHg.     Neuro/Psych  PSYCHIATRIC DISORDERS  Depression    negative neurological ROS     GI/Hepatic ,GERD  Controlled,,(+)     substance abuse (last use of cocaine - 03/29/23)  cocaine use  Endo/Other  diabetes, Poorly Controlled, Type 1, Insulin Dependent, Oral Hypoglycemic Agents    Renal/GU negative Renal ROS  negative genitourinary   Musculoskeletal  (+) Arthritis ,    Abdominal   Peds negative pediatric ROS (+)  Hematology negative hematology ROS (+)   Anesthesia Other Findings   Reproductive/Obstetrics negative OB ROS                             Anesthesia Physical Anesthesia Plan  ASA: 4  Anesthesia Plan: Spinal   Post-op Pain Management: Dilaudid IV   Induction: Intravenous  PONV Risk Score and Plan: 3 and Ondansetron, Dexamethasone and Metaclopromide  Airway Management Planned: Natural Airway, Nasal Cannula and Simple Face Mask  Additional Equipment:   Intra-op Plan:   Post-operative Plan:   Informed Consent: I have reviewed the patients History and Physical, chart, labs and discussed the procedure including the risks, benefits and alternatives for the proposed anesthesia with the patient or authorized representative who has indicated his/her  understanding and acceptance.     Dental advisory given  Plan Discussed with: CRNA and Surgeon  Anesthesia Plan Comments:         Anesthesia Quick Evaluation

## 2023-03-31 NOTE — Anesthesia Procedure Notes (Signed)
Spinal  Patient location during procedure: OR Start time: 03/31/2023 12:48 PM End time: 03/31/2023 12:54 PM Reason for block: surgical anesthesia Staffing Performed: anesthesiologist  Anesthesiologist: Molli Barrows, MD Performed by: Molli Barrows, MD Authorized by: Molli Barrows, MD   Preanesthetic Checklist Completed: patient identified, IV checked, site marked, risks and benefits discussed, surgical consent, monitors and equipment checked, pre-op evaluation and timeout performed Spinal Block Patient position: left lateral decubitus Prep: ChloraPrep Patient monitoring: heart rate, cardiac monitor, continuous pulse ox and blood pressure Approach: midline Location: L4-5 Injection technique: single-shot Needle Needle type: Sprotte and Spinocan  Needle gauge: 24 G Needle length: 9 cm Needle insertion depth: 6 cm Assessment Events: other event, injected lidocaine 1% instead of bupivcaine and CSF return

## 2023-03-31 NOTE — Anesthesia Procedure Notes (Signed)
Procedure Name: Intubation Date/Time: 03/31/2023 1:06 PM  Performed by: Franco Nones, CRNAPre-anesthesia Checklist: Patient identified, Patient being monitored, Timeout performed, Emergency Drugs available and Suction available Patient Re-evaluated:Patient Re-evaluated prior to induction Oxygen Delivery Method: Circle system utilized Preoxygenation: Pre-oxygenation with 100% oxygen Induction Type: IV induction Ventilation: Mask ventilation without difficulty Laryngoscope Size: Mac and 3 Grade View: Grade I Tube type: Oral Tube size: 7.0 mm Number of attempts: 1 Airway Equipment and Method: Stylet Placement Confirmation: ETT inserted through vocal cords under direct vision, positive ETCO2 and breath sounds checked- equal and bilateral Secured at: 21 cm Tube secured with: Tape Dental Injury: Teeth and Oropharynx as per pre-operative assessment

## 2023-03-31 NOTE — Progress Notes (Signed)
ORTHOPAEDIC PROGRESS NOTE  Scheduled for Procedure(s): Retrograde femoral nail for comminuted distal femur fracture   DOS: rescheduled for 03/31/2023  SUBJECTIVE: No issues overnight.  She has some pain in the right leg.  She is ready to proceed with surgery.  OBJECTIVE: PE:  Resting.  Easily awakens  Knee immobilizer on right knee Swelling about the knee Sensation intact distally.  Toes are warm and well perfused.   Vitals:   03/30/23 1646 03/30/23 2019  BP: 119/64 113/68  Pulse: 81 77  Resp: 20 20  Temp: 99.1 F (37.3 C) 98.9 F (37.2 C)  SpO2: 95% 100%      Latest Ref Rng & Units 03/31/2023    5:13 AM 03/30/2023    4:09 AM 03/29/2023    6:51 AM  CBC  WBC 4.0 - 10.5 K/uL 8.4  7.9  13.8   Hemoglobin 12.0 - 15.0 g/dL 16.1  09.6  04.5   Hematocrit 36.0 - 46.0 % 31.7  33.5  39.4   Platelets 150 - 400 K/uL 228  245  258      ASSESSMENT: Miranda Sampson is a 72 y.o. female who is medically stable.  Ready to proceed with surgery.  She has been n.p.o. since midnight.  Plan for OR today, and ambulate with PT/OT tomorrow.  PLAN: Weightbearing: NWB RLE Insicional and dressing care: Reinforce dressings as needed; none currently Orthopedic device(s):  Knee immobilizer VTE prophylaxis: None currently, please hold until POD#1 Pain control: PRN medications, judicious use of narcotics Follow - up plan: 2 weeks postop   Contact information:     Rayyan Burley A. Dallas Schimke, MD MS [facility] [address] [place],  [place]  [address] Phone: [phone] Fax: [phone]

## 2023-03-31 NOTE — Op Note (Signed)
Orthopaedic Surgery Operative Note (CSN: [id number])  Miranda Sampson  09-01-1951 Date of Surgery: 03/31/2023   Diagnoses:  Comminuted right distal femoral shaft fracture  Procedure: Retrograde femoral nail for comminuted right distal femoral shaft fracture   Operative Finding Successful completion of the planned procedure.  Placed a 13 mm x 400 mm nail with 3 distal interlocking screws, and 2 proximal interlocking screws.   Post-Op Diagnosis: Same Surgeons:Primary: Oliver Barre, MD Assistants: 1.  Cecile Sheerer followed by  2.  Mariane Baumgarten Location: [facility] OR ROOM 4 Anesthesia: General with regional anesthesia Antibiotics: Ancef 2 g Tourniquet time: N/A Estimated Blood Loss: 200 cc Complications: None Specimens: None  Implants: Implant Name Type Inv. Item Serial No. Manufacturer Lot No. LRB No. Used Action  Retrograde Femoral Nail, 13 mm x 40 cm    [employer] [id number] Right 1 Implanted  CORTICAL SCREW, CAPTURED, FT, 6.5 x 90 mm     [id number] Right 1 Implanted  CANCELLOUS SCREW, CAPTURED, FT 6.5 x 90 mm     [id number] Right 1 Implanted  CANCELLOUS SCREW, CAPTURED, FT, 6.5 x 75 mm     [id number] Right 1 Implanted  CORTICAL SCREW, CAPTURED, 5.0 x 40 mm     [id number] Right 2 Implanted    Indications for Surgery:   Miranda Sampson is a 72 y.o. female who fell and sustained a comminuted right distal femoral shaft fracture.  Given the severity of the injury, I recommended operative fixation, in order to restore form and function.  Benefits and risks of operative and nonoperative management were discussed prior to surgery with the patient and informed consent form was completed.  Specific risks including infection, need for additional surgery, nonunion, malunion, stiffness, persistent pain, bleeding and more severe complications associate with anesthesia.  All questions were answered.  She elected proceed.  Surgical consent was finalized.   Procedure:   The patient was identified properly.  Informed consent was obtained and the surgical site was marked. The patient was taken to the OR where general anesthesia was induced.  The patient was positioned supine.  The right leg was prepped and draped in the usual sterile fashion.  Timeout was performed before the beginning of the case.  She received 2 g of Ancef, and 1 g of TXA prior to making incision.  The right leg was placed on a sterile triangle.  We pulled traction to approximate the fracture fragments, and restore length.  We made a longitudinal incision from the distal pole of the patella extending to the tibial tubercle, just medial.  We incised sharply through skin.  We then continued with sharp dissection to the level of the peritenon.  We then identified the patellar tendon.  We made a direct incision over the mid to central aspect of the patellar tendon.  We were careful not to affect the articular surface, or the anterior horn of the medial meniscus.  We then used a curved Mayo scissor to gain access to the joint.  There was an extensive amount of intra-articular hematoma which was expressed.  We then excised the small amount of the fat pad to improve visibility.  Once we gain access to the knee joint, we are able to insert an aiming device.  With the assistance of fluoroscopy, we achieved our starting position on both the AP and lateral x-rays of the knee.  Once were satisfied, the guidewire was advanced so it was central within the canal.  Once again, we pulled traction over  the triangle in order to approximate the fracture fragments.  Once were satisfied with our position, we used an entry reamer.  Given the extent of the comminution in the distal aspect of the right femur, we introduced the fracture reduction finger.  This was passed across the fracture site, with the assistance of fluoroscopy.  The ball-tipped guidewire was then advanced through the finger, to the proximal femur, at approximately the level of the lesser trochanter.  We  measured for a nail.  Based on our preoperative templating, we did not feel the need to read.  We selected a size 13 mm nail, which was 400 mm in length.  This was advanced in retrograde fashion.  We were able to visualize the end of the nail being countersunk within the right knee.  Once were satisfied with the depth of the nail distally, we evaluated the proximal extent of the nail.  Once again once were satisfied with the positioning of the nail, we proceeded the neck step.  An aiming arm was then attached to the nail outrigger.  We placed a single screw from lateral to medial which was threaded into the nail.  Next we passed 2 separate oblique screws from lateral to medial, as well as one from medial to lateral, each with separate trajectories.  These both threaded into the nail as well.  Orthogonal x-rays confirm the placement of the screws.  We then turned our attention to the proximal extent of the nail.  Using a perfect circle technique, we were able to place 2 bicortical screws through the nail.  Orthogonal images on x-ray confirm the positioning of the screw, as well as the overall length.  Final radiographs confirmed appropriate placement of the nail, with rough approximation of the comminuted fracture fragments.  We confirmed the overall rotation based on her preoperative plan, and we were satisfied that we had achieved appropriate placement of the nail, as well as the rotation of the right lower extremity.  We irrigated the wounds copiously.  We closed the incision in a multilayer fashion with absorbable suture.  Sterile dressing was placed, followed by an Ace wrap around the knee.  Patient was awoken taken to PACU in stable condition.   Post-operative plan:  The patient will be WBAT on the operative extremity; protected, using a walker at all times. She will be returned to the floor DVT prophylaxis per primary team, no orthopedic contraindications.  Please hold DVT prophylaxis until  postoperative day #1. She will work with physical therapy and Occupational Therapy starting postoperative day #1. Pain control with PRN pain medication preferring oral medicines.   Follow up plan will be scheduled in approximately 10-14 days for incision check and XR.

## 2023-04-01 DIAGNOSIS — S72351A Displaced comminuted fracture of shaft of right femur, initial encounter for closed fracture: Secondary | ICD-10-CM | POA: Diagnosis not present

## 2023-04-01 DIAGNOSIS — F1410 Cocaine abuse, uncomplicated: Secondary | ICD-10-CM | POA: Diagnosis not present

## 2023-04-01 DIAGNOSIS — I959 Hypotension, unspecified: Secondary | ICD-10-CM | POA: Diagnosis not present

## 2023-04-01 DIAGNOSIS — E119 Type 2 diabetes mellitus without complications: Secondary | ICD-10-CM | POA: Diagnosis not present

## 2023-04-01 LAB — COMPREHENSIVE METABOLIC PANEL
ALT: 11 U/L (ref 0–44)
AST: 13 U/L — ABNORMAL LOW (ref 15–41)
Albumin: 2.3 g/dL — ABNORMAL LOW (ref 3.5–5.0)
Alkaline Phosphatase: 58 U/L (ref 38–126)
Anion gap: 8 (ref 5–15)
BUN: 20 mg/dL (ref 8–23)
CO2: 25 mmol/L (ref 22–32)
Calcium: 8.1 mg/dL — ABNORMAL LOW (ref 8.9–10.3)
Chloride: 100 mmol/L (ref 98–111)
Creatinine, Ser: 0.69 mg/dL (ref 0.44–1.00)
GFR, Estimated: >60 mL/min (ref >60)
Glucose, Bld: 411 mg/dL — ABNORMAL HIGH (ref 70–99)
Potassium: 4.6 mmol/L (ref 3.5–5.1)
Sodium: 133 mmol/L — ABNORMAL LOW (ref 135–145)
Total Bilirubin: 1 mg/dL (ref 0.3–1.2)
Total Protein: 6.2 g/dL — ABNORMAL LOW (ref 6.5–8.1)

## 2023-04-01 LAB — GLUCOSE, CAPILLARY
Glucose-Capillary: 367 mg/dL — ABNORMAL HIGH (ref 70–99)
Glucose-Capillary: 265 mg/dL — ABNORMAL HIGH (ref 70–99)
Glucose-Capillary: 227 mg/dL — ABNORMAL HIGH (ref 70–99)
Glucose-Capillary: 358 mg/dL — ABNORMAL HIGH (ref 70–99)

## 2023-04-01 LAB — CBC
HCT: 28.7 % — ABNORMAL LOW (ref 36.0–46.0)
Hemoglobin: 9.4 g/dL — ABNORMAL LOW (ref 12.0–15.0)
MCH: 33.1 pg (ref 26.0–34.0)
MCHC: 32.8 g/dL (ref 30.0–36.0)
MCV: 101.1 fL — ABNORMAL HIGH (ref 80.0–100.0)
Platelets: 229 10*3/uL (ref 150–400)
RBC: 2.84 MIL/uL — ABNORMAL LOW (ref 3.87–5.11)
RDW: 13.2 % (ref 11.5–15.5)
WBC: 10.4 10*3/uL (ref 4.0–10.5)
nRBC: 0 % (ref 0.0–0.2)

## 2023-04-01 MED ORDER — INSULIN GLARGINE-YFGN 100 UNIT/ML ~~LOC~~ SOLN
40.0000 [IU] | Freq: Every day | SUBCUTANEOUS | Status: DC
  Administered 2023-04-02 (×2): 40 [IU] via SUBCUTANEOUS
  Filled 2023-04-01 (×3): qty 0.4

## 2023-04-01 MED ORDER — ASPIRIN 81 MG PO CHEW
81.0000 mg | CHEWABLE_TABLET | Freq: Two times a day (BID) | ORAL | Status: DC
  Administered 2023-04-01 – 2023-04-03 (×3): 81 mg via ORAL
  Filled 2023-04-01 (×4): qty 1

## 2023-04-01 NOTE — Progress Notes (Signed)
[facility]  PROGRESS NOTE  Miranda Sampson:[id number] DOB: 11-18-1951 DOA: 03/29/2023 PCP: Benetta Spar, MD   Brief HPI:   72 y.o. female with medical history significant of allergies, depression, diabetes mellitus, GERD, hyperlipidemia, substance abuse, and more presents the ED with a chief complaint of fall.  She was admitted for displaced comminuted fracture of right femoral shaft.  Orthopedics to evaluate with plans for operation on Friday with delayed due to need for cardiology clearance and noted SVT.     Subjective   Patient seen and examined, no new complaints.  Pain well-controlled.   Assessment/Plan:    Displaced comminuted fracture of shaft of right femur, initial encounter for closed fracture -S/p mechanical fall; x-ray showed comminuted displaced distal femoral shaft fracture -Cardiology was consulted for preoperative cardiac clearance and SVT likely in setting of cocaine use -She was cleared by cardiology for surgery today   SVT -Known history of SVT and required adenosine 4/17, likely triggered by cocaine use  -Cardiology was consulted, not started on beta-blocker due to recent cocaine use -Recommend to keep potassium more than 4.0, magnesium more than 2.0 -TSH 1.596   Hypotension -Resolved -Initially presented with hypotension, vasovagal? -1 L fluid bolus given in the ED -Continue monitoring  Cocaine abuse - Counseled on importance of cessation - UDS ordered and pending - TOC consult   Hyperlipidemia - Continue statin   Diabetes mellitus - Continue SSI -Semglee 30 units at bedtime -Hemoglobin A1c 8.5% 10/2022 -CBG has been elevated -Increase Semglee to 40 units subcu daily at bedtime     Medications     aspirin  81 mg Oral BID   atorvastatin  20 mg Oral Daily   gabapentin  100 mg Oral QHS   insulin aspart  0-15 Units Subcutaneous TID WC   insulin aspart  0-5 Units Subcutaneous QHS   insulin glargine-yfgn  30 Units  Subcutaneous QHS     Data Reviewed:   CBG:  Recent Labs  Lab 03/31/23 1529 03/31/23 1617 03/31/23 2044 04/01/23 0734 04/01/23 1102  GLUCAP 183* 175* 429* 367* 265*    SpO2: 94 % O2 Flow Rate (L/min): 2 L/min    Vitals:   04/01/23 0009 04/01/23 0314 04/01/23 0904 04/01/23 1408  BP: 108/61 111/70 119/72 118/66  Pulse: 86 78 81 85  Resp: Temp: 98.6 F (37 C) 98.6 F (37 C) 99.3 F (37.4 C) 98.3 F (36.8 C)  TempSrc: Oral Oral Oral   SpO2: 98% 98% 98% 94%  Weight:      Height:          Data Reviewed:  Basic Metabolic Panel: Recent Labs  Lab 03/29/23 0230 03/29/23 0651 03/30/23 0409 03/31/23 0513 04/01/23 0439  NA 131* 132* 134* 133* 133*  K 4.1 4.3 3.7 4.1 4.6  CL 95* 97* 99 100 100  CO2 GLUCOSE 412* 386* 51* 83 411*  BUN 22 23 26* 19 20  CREATININE 0.99 0.84 0.62 0.51 0.69  CALCIUM 9.2 9.1 8.6* 8.2* 8.1*  MG  --  1.4* 1.7 1.7  --     CBC: Recent Labs  Lab 03/29/23 0230 03/29/23 0651 03/30/23 0409 03/31/23 0513 04/01/23 0439  WBC 20.1* 13.8* 7.9 8.4 10.4  NEUTROABS 17.5* 11.6*  --   --   --   HGB 13.9 13.2 11.0* 10.4* 9.4*  HCT 40.5 39.4 33.5* 31.7* 28.7*  MCV 98.1 99.0 100.9* 101.9* 101.1*  PLT 321 258  245 228 229    LFT Recent Labs  Lab 03/29/23 0651 04/01/23 0439  AST 16 13*  ALT 13 11  ALKPHOS 76 58  BILITOT 1.2 1.0  PROT 7.7 6.2*  ALBUMIN 3.2* 2.3*     Antibiotics: Anti-infectives (From admission, onward)    Start     Dose/Rate Route Frequency Ordered Stop   03/31/23 2200  ceFAZolin (ANCEF) IVPB 2g/100 mL premix        2 g 200 mL/hr over 30 Minutes Intravenous Every 8 hours 03/31/23 1609 04/01/23 1351   03/31/23 1132  ceFAZolin (ANCEF) 2-4 GM/100ML-% IVPB       Note to Pharmacy: Hale Bogus, Amy P: cabinet override      03/31/23 1132 03/31/23 2344   03/30/23 1200  ceFAZolin (ANCEF) IVPB 2g/100 mL premix        2 g 200 mL/hr over 30 Minutes Intravenous On call to O.R. 03/29/23 0350 03/31/23 0559         DVT prophylaxis: SCDs  Code Status: Full code  Family Communication: No family at bedside   CONSULTS orthopedics   Objective    Physical Examination:   General-appears in no acute distress Heart-S1-S2, regular, no murmur auscultated Lungs-clear to auscultation bilaterally, no wheezing or crackles auscultated Abdomen-soft, nontender, no organomegaly Extremities-no edema in the lower extremities Neuro-alert, oriented x3, no focal deficit noted Status is: Inpatient:             Meredeth Ide   [facility] If 7PM-7AM, please contact night-coverage at [email or web address], Office  [phone]   04/01/2023, 4:08 PM  LOS: 3 days

## 2023-04-01 NOTE — Progress Notes (Signed)
ORTHOPAEDIC PROGRESS NOTE  Status post: Retrograde femoral nail for comminuted distal femur fracture   DOS: 03/31/2023  SUBJECTIVE: No issues overnight.  Pain is controlled.  She is resting comfortably.  No complaints this morning.  OBJECTIVE: PE:  Resting.  Easily awakens  Ace wrap on the right knee is clean, dry and intact.  Proximal dressing is clean, dry and intact. Improved swelling about the knee Sensation intact distally.  Toes are warm and well perfused.  Active dorsiflexion of the ankle and the great toe.  Vitals:   04/01/23 0009 04/01/23 0314  BP: 108/61 111/70  Pulse: 86 78  Resp: 20   Temp: 98.6 F (37 C) 98.6 F (37 C)  SpO2: 98% 98%      Latest Ref Rng & Units 04/01/2023    4:39 AM 03/31/2023    5:13 AM 03/30/2023    4:09 AM  CBC  WBC 4.0 - 10.5 K/uL 10.4  8.4  7.9   Hemoglobin 12.0 - 15.0 g/dL 9.4  16.1  09.6   Hematocrit 36.0 - 46.0 % 28.7  31.7  33.5   Platelets 150 - 400 K/uL 229  228  245      ASSESSMENT: Miranda Sampson is a 72 y.o. female who is doing well following surgery.   PLAN: Weightbearing: Weightbearing as tolerated RLE, using a walker.  Protected weightbearing. Insicional and dressing care: Reinforce dressings as needed Orthopedic device(s): None VTE prophylaxis: At the discretion of the primary team.  No orthopedic contraindications.  Recommend 81 mg aspirin twice daily x 6 weeks. Pain control: PRN medications, judicious use of narcotics Follow - up plan: 2 weeks postop  Waiting to work with PT/OT.  Dispo is pending.   Contact information:     Finnean Cerami A. Dallas Schimke, MD MS [facility] [address] [place],  [place]  [address] Phone: [phone] Fax: [phone]

## 2023-04-01 NOTE — TOC Progression Note (Signed)
Transition of Care [facility] - Progression Note    Patient Details  Name: Miranda Sampson MRN: [id number] Date of Birth: [birth year]  Transition of Care [facility]) CM/SW Contact  Catalina Gravel, [place] Phone Number: 04/01/2023, 4:20 PM  Clinical Narrative:    Pt will need several days. Surgery 4/19.  DC 2-3 days.     Barriers to Discharge: Continued Medical Work up  Expected Discharge Plan and Services                                               Social Determinants of Health (SDOH) Interventions SDOH Screenings   Food Insecurity: No Food Insecurity (03/29/2023)  Housing: Low Risk  (03/29/2023)  Transportation Needs: No Transportation Needs (03/29/2023)  Utilities: Not At Risk (03/29/2023)  Tobacco Use: Low Risk  (03/31/2023)    Readmission Risk Interventions     No data to display

## 2023-04-01 NOTE — Plan of Care (Signed)
  Problem: Acute Rehab PT Goals(only PT should resolve) Goal: Pt Will Go Supine/Side To Sit Flowsheets (Taken 04/01/2023 1103) Pt will go Supine/Side to Sit: with moderate assist Goal: Pt Will Go Sit To Supine/Side Flowsheets (Taken 04/01/2023 1103) Pt will go Sit to Supine/Side: with moderate assist Goal: Patient Will Transfer Sit To/From Stand Flowsheets (Taken 04/01/2023 1103) Patient will transfer sit to/from stand: with moderate assist Goal: Pt Will Ambulate Flowsheets (Taken 04/01/2023 1103) Pt will Ambulate:  10 feet  with moderate assist  with rolling walker

## 2023-04-01 NOTE — Progress Notes (Addendum)
16 french foley removed per Dr Daune Perch orders,patient tolerated removal of foley. Plan of care on going. Call bell in reach.

## 2023-04-01 NOTE — Evaluation (Signed)
Physical Therapy Evaluation Patient Details Name: Miranda Sampson MRN: [id number] DOB: December 20, 1950 Today's Date: 04/01/2023  History of Present Illness  Miranda Sampson is a 73 y.o. female with medical history significant of allergies, depression, diabetes mellitus, GERD, hyperlipidemia, substance abuse, and more presents the ED with a chief complaint of fall.  Patient reports that she was walking up the stairs when she missed 1 and fell backwards.  She sustained a Rt femur fx and had an ORIF on 4/19.  She is WBAT  Clinical Impression  Pt treatment limited by high level of pain.  Pt had taken pain meds prior to therapist working with her however pt still was unable to tolerate any movement of Rt LE beyond a few inches and was unable to do this I.        Recommendations for follow up therapy are one component of a multi-disciplinary discharge planning process, led by the attending physician.  Recommendations may be updated based on patient status, additional functional criteria and insurance authorization.  Follow Up Recommendations Can patient physically be transported by private vehicle: No     Assistance Recommended at Discharge Frequent or constant Supervision/Assistance  Patient can return home with the following  Two people to help with walking and/or transfers;A lot of help with bathing/dressing/bathroom;Assistance with cooking/housework    Equipment Recommendations None recommended by PT  Recommendations for Other Services  OT consult    Functional Status Assessment Patient has had a recent decline in their functional status and demonstrates the ability to make significant improvements in function in a reasonable and predictable amount of time.     Precautions / Restrictions Precautions Precautions: Fall Restrictions Weight Bearing Restrictions: Yes RLE Weight Bearing: Weight bearing as tolerated      Mobility  Bed Mobility Overal bed mobility: Needs Assistance Bed Mobility:  Rolling, Supine to Sit Rolling: Max assist   Supine to sit: Max assist     General bed mobility comments: unable to come to total sitting postition due to pain today    Transfers Overall transfer level:  (unable at this time)                               Pertinent Vitals/Pain Pain Assessment Pain Assessment: 0-10 Pain Score: 6  Pain Location: Rt LE , with therapy pt states her pain is a 10/10 but pt has already had pain meds.  Pain decreased with decreased activity Pain Descriptors / Indicators: Aching Pain Intervention(s): Limited activity within patient's tolerance    Home Living Family/patient expects to be discharged to:: Skilled nursing facility Living Arrangements: Alone                      Prior Function Prior Level of Function : Independent/Modified Independent             Mobility Comments: Household ambulator with [facility], does not drives, daughter grocery shops ADLs Comments: Independent     Hand Dominance   Dominant Hand: Right    Extremity/Trunk Assessment        Lower Extremity Assessment Lower Extremity Assessment: RLE deficits/detail RLE Deficits / Details: hip mm is 1+/5 at this time due to pain, pt needs assist with all motion RLE: Unable to fully assess due to pain       Communication   Communication: No difficulties  Cognition Arousal/Alertness: Awake/alert Behavior During Therapy: WFL for tasks assessed/performed Overall Cognitive Status: Within  Functional Limits for tasks assessed                                             Exercises General Exercises - Lower Extremity Ankle Circles/Pumps: Both, 5 reps Quad Sets: Both, 5 reps Gluteal Sets: Both, 5 reps Heel Slides: Both, AAROM, 5 reps Hip ABduction/ADduction: Right, 5 reps, AAROM   Assessment/Plan    PT Assessment Patient needs continued PT services  PT Problem List Decreased strength;Decreased activity tolerance;Decreased range of  motion;Decreased balance;Decreased mobility;Pain       PT Treatment Interventions Gait training;Therapeutic activities;Therapeutic exercise;Functional mobility training;Patient/family education    PT Goals (Current goals can be found in the Care Plan section)       Frequency Min 5X/week        AM-PAC PT "6 Clicks" Mobility  Outcome Measure Help needed turning from your back to your side while in a flat bed without using bedrails?: Total Help needed moving from lying on your back to sitting on the side of a flat bed without using bedrails?: Total Help needed moving to and from a bed to a chair (including a wheelchair)?: Total Help needed standing up from a chair using your arms (e.g., wheelchair or bedside chair)?: Total Help needed to walk in hospital room?: Total Help needed climbing 3-5 steps with a railing? : Total 6 Click Score: 6    End of Session   Activity Tolerance: Patient limited by pain Patient left: in bed;with chair alarm set;with call bell/phone within reach   PT Visit Diagnosis: Unsteadiness on feet (R26.81);History of falling (Z91.81);Muscle weakness (generalized) (M62.81);Pain Pain - Right/Left: Right Pain - part of body: Leg    Time: 1030-1104 PT Time Calculation (min) (ACUTE ONLY): 34 min   Charges:   PT Evaluation $PT Eval Low Complexity: 1 Low PT Treatments $Therapeutic Exercise: 8-22 mins       Virgina Organ, PT CLT [phone]  04/01/2023, 11:05 AM

## 2023-04-02 ENCOUNTER — Inpatient Hospital Stay (HOSPITAL_COMMUNITY): Payer: 59

## 2023-04-02 DIAGNOSIS — R410 Disorientation, unspecified: Secondary | ICD-10-CM

## 2023-04-02 DIAGNOSIS — S72351A Displaced comminuted fracture of shaft of right femur, initial encounter for closed fracture: Secondary | ICD-10-CM | POA: Diagnosis not present

## 2023-04-02 DIAGNOSIS — E119 Type 2 diabetes mellitus without complications: Secondary | ICD-10-CM | POA: Diagnosis not present

## 2023-04-02 DIAGNOSIS — I959 Hypotension, unspecified: Secondary | ICD-10-CM | POA: Diagnosis not present

## 2023-04-02 DIAGNOSIS — F1410 Cocaine abuse, uncomplicated: Secondary | ICD-10-CM | POA: Diagnosis not present

## 2023-04-02 LAB — GLUCOSE, CAPILLARY
Glucose-Capillary: 182 mg/dL — ABNORMAL HIGH (ref 70–99)
Glucose-Capillary: 296 mg/dL — ABNORMAL HIGH (ref 70–99)
Glucose-Capillary: 221 mg/dL — ABNORMAL HIGH (ref 70–99)
Glucose-Capillary: 190 mg/dL — ABNORMAL HIGH (ref 70–99)

## 2023-04-02 NOTE — Anesthesia Postprocedure Evaluation (Signed)
Anesthesia Post Note  Patient: Miranda Sampson  Procedure(s) Performed: INTRAMEDULLARY (IM) RETROGRADE FEMORAL NAILING (Right: Knee)  Patient location during evaluation: Nursing Unit Anesthesia Type: Combined General/Spinal Level of consciousness: awake and alert and oriented Pain management: pain level controlled Vital Signs Assessment: post-procedure vital signs reviewed and stable Respiratory status: spontaneous breathing, nonlabored ventilation and respiratory function stable Cardiovascular status: blood pressure returned to baseline and stable Postop Assessment: no apparent nausea or vomiting Anesthetic complications: no Comments: Talked to the patient yesterday and today, moving her both extremities, no new neurological deficits from spinal anesthesia, explained to the patient that  short acting spinal anesthetic lidocaine was given accidentally instead of long acting bupivacaine, patient had good pain relief during and after the procedure.  No notable events documented.   Last Vitals:  Vitals:   04/02/23 0725 04/02/23 1400  BP:  105/71  Pulse:  87  Resp:  17  Temp:  37 C  SpO2: 97% 95%    Last Pain:  Vitals:   04/02/23 1400  TempSrc: Oral  PainSc:                  [name] C Chioke Noxon

## 2023-04-02 NOTE — Addendum Note (Signed)
Addendum  created 04/02/23 1612 by Molli Barrows, MD   Clinical Note Signed

## 2023-04-02 NOTE — TOC Progression Note (Addendum)
Transition of Care [facility]) - Progression Note    Patient Details  Name: KALIANNA VERBEKE MRN: [id number] Date of Birth: 1951-02-01  Transition of Care [facility]) CM/SW Contact  Catalina Gravel, [place] Phone Number: 04/02/2023, 2:08 PM  Clinical Narrative:    CSW met with pt at bedside to talk about SNF. Pt asks how much will it cost.  CSW shared we will seek insurance auth and any other costs CSW not aware.  Pt states I will let you know next week, as pt wants to "be home for Mother's Day" . Shared with pt Mothers Day is a couple weeks away- she thought was next week, Asked pt if I can send her information out so she will have choices. Pt said no and she will think about and tell me tomorrow. Advised pt I will not be here. I will start FL2 and not send. Not starting Auth as pt said no to SNF but thinking about it.PASSR [id number]     Barriers to Discharge: Continued Medical Work up  Expected Discharge Plan and Services                                               Social Determinants of Health (SDOH) Interventions SDOH Screenings   Food Insecurity: No Food Insecurity (03/29/2023)  Housing: Low Risk  (03/29/2023)  Transportation Needs: No Transportation Needs (03/29/2023)  Utilities: Not At Risk (03/29/2023)  Tobacco Use: Low Risk  (03/31/2023)    Readmission Risk Interventions     No data to display

## 2023-04-02 NOTE — Progress Notes (Addendum)
[facility]  PROGRESS NOTE  Miranda Sampson WUJ:[id number] DOB: 03-Aug-1951 DOA: 03/29/2023 PCP: Benetta Spar, MD   Brief HPI:   72 y.o. female with medical history significant of allergies, depression, diabetes mellitus, GERD, hyperlipidemia, substance abuse, and more presents the ED with a chief complaint of fall.  She was admitted for displaced comminuted fracture of right femoral shaft.  Orthopedics to evaluate with plans for operation on Friday with delayed due to need for cardiology clearance and noted SVT.     Subjective   Patient seen and examined, pleasantly confused this morning.  Oriented to self only.    Assessment/Plan:    Displaced comminuted fracture of shaft of right femur, initial encounter for closed fracture -S/p mechanical fall; x-ray showed comminuted displaced distal femoral shaft fracture -Underwent retrograde femoral nail for comminuted distal femur fracture. -Cardiology was consulted for preoperative cardiac clearance and SVT likely in setting of cocaine use -She was cleared by cardiology for surgery  -Orthopedics recommended to start aspirin 81 mg twice daily for 6 weeks for DVT prophylaxis  Altered mental status -Patient is oriented to self only -Likely medication induced, will discontinue morphine and oxycodone. -CT head obtained showed no acute abnormality, will check UA.  SVT -Known history of SVT and required adenosine 4/17, likely triggered by cocaine use  -Cardiology was consulted, not started on beta-blocker due to recent cocaine use -Recommend to keep potassium more than 4.0, magnesium more than 2.0 -TSH 1.596   Hypotension -Resolved -Initially presented with hypotension, vasovagal? -1 L fluid bolus given in the ED -Continue monitoring  Cocaine abuse - Counseled on importance of cessation - UDS ordered and pending - TOC consult   Hyperlipidemia - Continue statin   Diabetes mellitus - Continue SSI -Semglee 30 units at  bedtime -Hemoglobin A1c 8.5% 10/2022 -CBG has been elevated -Increase Semglee to 40 units subcu daily at bedtime     Medications     aspirin  81 mg Oral BID   atorvastatin  20 mg Oral Daily   gabapentin  100 mg Oral QHS   insulin aspart  0-15 Units Subcutaneous TID WC   insulin aspart  0-5 Units Subcutaneous QHS   insulin glargine-yfgn  40 Units Subcutaneous QHS     Data Reviewed:   CBG:  Recent Labs  Lab 04/01/23 1102 04/01/23 1615 04/01/23 2132 04/02/23 0742 04/02/23 1135  GLUCAP 265* 227* 358* 182* 296*    SpO2: 95 % O2 Flow Rate (L/min): 1 L/min    Vitals:   04/02/23 0258 04/02/23 0600 04/02/23 0725 04/02/23 1400  BP: 125/69   105/71  Pulse: 81   87  Resp: 18   17  Temp: 99.1 F (37.3 C)   98.6 F (37 C)  TempSrc:    Oral  SpO2: 93% 98% 97% 95%  Weight:      Height:          Data Reviewed:  Basic Metabolic Panel: Recent Labs  Lab 03/29/23 0230 03/29/23 0651 03/30/23 0409 03/31/23 0513 04/01/23 0439  NA 131* 132* 134* 133* 133*  K 4.1 4.3 3.7 4.1 4.6  CL 95* 97* 99 100 100  CO2 GLUCOSE 412* 386* 51* 83 411*  BUN 22 23 26* 19 20  CREATININE 0.99 0.84 0.62 0.51 0.69  CALCIUM 9.2 9.1 8.6* 8.2* 8.1*  MG  --  1.4* 1.7 1.7  --    Miranda FULLcent Labs  Lab 03/29/23 0230 03/29/23 0651 03/30/23 0409  03/31/23 0513 04/01/23 0439  WBC 20.1* 13.8* 7.9 8.4 10.4  NEUTROABS 17.5* 11.6*  --   --   --   HGB 13.9 13.2 11.0* 10.4* 9.4*  HCT 40.5 39.4 33.5* 31.7* 28.7*  MCV 98.1 99.0 100.9* 101.9* 101.1*  PLT 321 258 245 228 229    LFT Recent Labs  Lab 03/29/23 0651 04/01/23 0439  AST 16 13*  ALT 13 11  ALKPHOS 76 58  BILITOT 1.2 1.0  PROT 7.7 6.2*  ALBUMIN 3.2* 2.3*     Antibiotics: Anti-infectives (From admission, onward)    Start     Dose/Rate Route Frequency Ordered Stop   03/31/23 2200  ceFAZolin (ANCEF) IVPB 2g/100 mL premix        2 g 200 mL/hr over 30 Minutes Intravenous Every 8 hours 03/31/23 1609  04/01/23 1351   03/31/23 1132  ceFAZolin (ANCEF) 2-4 GM/100ML-% IVPB       Note to Pharmacy: Hale Bogus, Amy P: cabinet override      03/31/23 1132 03/31/23 2344   03/30/23 1200  ceFAZolin (ANCEF) IVPB 2g/100 mL premix        2 g 200 mL/hr over 30 Minutes Intravenous On call to O.R. 03/29/23 0350 03/31/23 0559        DVT prophylaxis: SCDs  Code Status: Sampson code  Family Communication: No family at bedside   CONSULTS orthopedics   Objective    Physical Examination:  General-appears in no acute distress Heart-S1-S2, regular, no murmur auscultated Lungs-clear to auscultation bilaterally, no wheezing or crackles auscultated Abdomen-soft, nontender, no organomegaly Extremities-no edema in the lower extremities Neuro-alert, oriented to self only, following commands    Pressure Injury 04/01/23 Sacrum Mid Stage 2 -  Partial thickness loss of dermis presenting as a shallow open injury with a red, pink wound bed without slough. (Active)  04/01/23 1714  Location: Sacrum  Location Orientation: Mid  Staging: Stage 2 -  Partial thickness loss of dermis presenting as a shallow open injury with a red, pink wound bed without slough.  Wound Description (Comments):   Present on Admission: No        Miranda Sampson S Jovanie Verge   [facility] If 7PM-7AM, please contact night-coverage at [email or web address], Office  [phone]   04/02/2023, 2:27 PM  LOS: 4 days

## 2023-04-02 NOTE — Progress Notes (Addendum)
Patient began the night A & O x 4. However, she had trouble remembering phone numbers of her friends and family she wanted help with calling and became more confused later in the shift. She continued  through the night pulling the Purewick out of place and placing it on the bed rail, trying to get out of bed. Unable to pivot on the unaffected leg, requiring 2 staff members to get her back into bed. She did this twice. Her O2 level on room air was 93%. On 1L, O2 level was 98%. However, she clearly remembered when she had her pain medication last. Also, patient had a 2/3 eaten large honey bun on her tray table. RN educated patient on her sugar intake and high blood glucose levels. Patient stated, "I know all that".

## 2023-04-02 NOTE — Progress Notes (Addendum)
Patient very irritable this a.m. [place] Staff came to room when bed alarm sounded and found patient at end of bed. She stated she was "going home" and repeated statement several times. It took 25 minutes to convince her to let staff help her sit in recliner chair. She is 2 assist. Patient in recliner at this time with legs elevated with chair alarm activated, bed remote and room phone at her side in the chair. O2  is 97% on RA.

## 2023-04-02 NOTE — Progress Notes (Signed)
Pt['s bed alarm ringing, arrived to room to find pt attempting to get OOB past bottom side rail. Asked pt why she was getting up, pt states, ""I need to use the bathroom. I aint passed no stool since I been here. Reminded pt that she needs to call for assistance and not try to get OOB by herself due to recent surgery for fx leg. (This is the 4th time today pt has attempted to get up without calling for assistance despite multiple reminders and using bed and chair alarms and frequent rounding.) Three staff members in to get pt up on BSC. Pt very difficult to move as she will not put any weight on her good leg and keeps surgical leg bent out sideways. Does not bear weight on either leg. Pt currently attempting to have BM.

## 2023-04-03 ENCOUNTER — Other Ambulatory Visit (HOSPITAL_COMMUNITY): Payer: Self-pay

## 2023-04-03 ENCOUNTER — Ambulatory Visit: Payer: 59 | Admitting: Internal Medicine

## 2023-04-03 DIAGNOSIS — S72351A Displaced comminuted fracture of shaft of right femur, initial encounter for closed fracture: Secondary | ICD-10-CM | POA: Diagnosis not present

## 2023-04-03 LAB — CBC
HCT: 30.3 % — ABNORMAL LOW (ref 36.0–46.0)
Hemoglobin: 10 g/dL — ABNORMAL LOW (ref 12.0–15.0)
MCH: 33.4 pg (ref 26.0–34.0)
MCHC: 33 g/dL (ref 30.0–36.0)
MCV: 101.3 fL — ABNORMAL HIGH (ref 80.0–100.0)
Platelets: 291 10*3/uL (ref 150–400)
RBC: 2.99 MIL/uL — ABNORMAL LOW (ref 3.87–5.11)
RDW: 13.4 % (ref 11.5–15.5)
WBC: 9.8 10*3/uL (ref 4.0–10.5)
nRBC: 0 % (ref 0.0–0.2)

## 2023-04-03 LAB — BASIC METABOLIC PANEL
Anion gap: 7 (ref 5–15)
BUN: 12 mg/dL (ref 8–23)
CO2: 27 mmol/L (ref 22–32)
Calcium: 8.7 mg/dL — ABNORMAL LOW (ref 8.9–10.3)
Chloride: 102 mmol/L (ref 98–111)
Creatinine, Ser: 0.39 mg/dL — ABNORMAL LOW (ref 0.44–1.00)
GFR, Estimated: >60 mL/min (ref >60)
Glucose, Bld: 79 mg/dL (ref 70–99)
Potassium: 3.6 mmol/L (ref 3.5–5.1)
Sodium: 136 mmol/L (ref 135–145)

## 2023-04-03 LAB — GLUCOSE, CAPILLARY
Glucose-Capillary: 69 mg/dL — ABNORMAL LOW (ref 70–99)
Glucose-Capillary: 154 mg/dL — ABNORMAL HIGH (ref 70–99)
Glucose-Capillary: 184 mg/dL — ABNORMAL HIGH (ref 70–99)
Glucose-Capillary: 132 mg/dL — ABNORMAL HIGH (ref 70–99)

## 2023-04-03 LAB — MAGNESIUM: Magnesium: 1.3 mg/dL — ABNORMAL LOW (ref 1.7–2.4)

## 2023-04-03 LAB — URINALYSIS, ROUTINE W REFLEX MICROSCOPIC
Bilirubin Urine: NEGATIVE
Glucose, UA: NEGATIVE mg/dL
Hgb urine dipstick: NEGATIVE
Ketones, ur: NEGATIVE mg/dL
Leukocytes,Ua: NEGATIVE
Nitrite: NEGATIVE
Protein, ur: NEGATIVE mg/dL
Specific Gravity, Urine: 1.008 (ref 1.005–1.030)
pH: 7 (ref 5.0–8.0)

## 2023-04-03 MED ORDER — MAGNESIUM SULFATE 2 GM/50ML IV SOLN
2.0000 g | Freq: Once | INTRAVENOUS | Status: AC
  Administered 2023-04-03: 2 g via INTRAVENOUS
  Filled 2023-04-03: qty 50

## 2023-04-03 MED ORDER — INSULIN GLARGINE-YFGN 100 UNIT/ML ~~LOC~~ SOLN
37.0000 [IU] | Freq: Every day | SUBCUTANEOUS | Status: DC
  Administered 2023-04-03 – 2023-04-04 (×2): 37 [IU] via SUBCUTANEOUS
  Filled 2023-04-03 (×3): qty 0.37

## 2023-04-03 MED ORDER — APIXABAN 5 MG PO TABS
5.0000 mg | ORAL_TABLET | Freq: Two times a day (BID) | ORAL | Status: DC
  Administered 2023-04-03 – 2023-04-05 (×4): 5 mg via ORAL
  Filled 2023-04-03 (×5): qty 1

## 2023-04-03 MED ORDER — CARVEDILOL 3.125 MG PO TABS
3.1250 mg | ORAL_TABLET | Freq: Two times a day (BID) | ORAL | Status: DC
  Administered 2023-04-03 (×2): 3.125 mg via ORAL
  Filled 2023-04-03 (×3): qty 1

## 2023-04-03 MED ORDER — POTASSIUM CHLORIDE CRYS ER 20 MEQ PO TBCR
40.0000 meq | EXTENDED_RELEASE_TABLET | Freq: Once | ORAL | Status: AC
  Administered 2023-04-03: 40 meq via ORAL
  Filled 2023-04-03: qty 2

## 2023-04-03 MED ORDER — INSULIN ASPART 100 UNIT/ML IJ SOLN
3.0000 [IU] | Freq: Three times a day (TID) | INTRAMUSCULAR | Status: DC
  Administered 2023-04-03 – 2023-04-04 (×4): 3 [IU] via SUBCUTANEOUS

## 2023-04-03 NOTE — Progress Notes (Addendum)
Tele called and stated that patient was in SVT with rate of 160's. This RN went and assessed patient. EKG done, VS done. Instructed pt to bear down like she was having a BM. Patient did and HR improved. Patient denies chest pain. Respirations are equal and not labored. Patient continues to be A&O x self. Notified Dr. Thomes Dinning. Notified Ronnald Nian, charge RN.     04/03/23 0113  Vitals  Temp 98.7 F (37.1 C)  Temp Source Oral  BP (!) 152/82  MAP (mmHg) 102  BP Location Right Arm  BP Method Automatic  Patient Position (if appropriate) Lying  Pulse Rate 91  Pulse Rate Source Monitor  Resp 20  MEWS COLOR  MEWS Score Color Green  Oxygen Therapy  SpO2 98 %  O2 Device Room Air  MEWS Score  MEWS Temp 0  MEWS Systolic 0  MEWS Pulse 0  MEWS RR 0  MEWS LOC 0  MEWS Score 0

## 2023-04-03 NOTE — Progress Notes (Addendum)
Tele called and stated that patient was in SVT again HR 157. This RN assessed patient. Pt denies chest pain. Breathing is unlabored and regular. This RN instructed pt to bear down like she was having a BM. Patient did. HR improved. Notified Danesha, charge RN, Wendi Maya, [facility] RN, and Dr. Thomes Dinning. No new orders at this time.     04/03/23 0139  Vitals  BP 108/67  MAP (mmHg) 78  BP Method Automatic  Pulse Rate 91  Pulse Rate Source Monitor  MEWS COLOR  MEWS Score Color Green  Oxygen Therapy  SpO2 100 %  MEWS Score  MEWS Temp 0  MEWS Systolic 0  MEWS Pulse 0  MEWS RR 0  MEWS LOC 0  MEWS Score 0

## 2023-04-03 NOTE — TOC Initial Note (Signed)
Transition of Care [facility]) - Initial/Assessment Note    Patient Details  Name: Miranda Sampson MRN: [id number] Date of Birth: [birth year]  [facility]) CM/SW Contact:    Leitha Bleak, RN Phone Number: 04/03/2023, 11:46 AM  Clinical Narrative:      CM spoke with patient to follow up on her going to SNF for rehab. She is still guarded about going, she wants to go home. Lives alone, CM explained we are concerned that home health will not be enough.  She is agreeable to see what bed offers we get and she will talk to her daughter. TOC sending out FL2 and following to start INS AUTH.    Expected Discharge Plan: Skilled Nursing Facility Barriers to Discharge: Continued Medical Work up   Patient Goals and CMS Choice Patient states their goals for this hospitalization and ongoing recovery are:: to get stronger CMS Medicare.gov Compare Post Acute Care list provided to:: Patient Choice offered to / list presented to : Patient      Expected Discharge Plan and Services      Living arrangements for the past 2 months: Single Family Home          Prior Living Arrangements/Services Living arrangements for the past 2 months: Single Family Home Lives with:: Self Patient language and need for interpreter reviewed:: Yes        Need for Family Participation in Patient Care: Yes (Comment) Care giver support system in place?: Yes (comment)   Criminal Activity/Legal Involvement Pertinent to Current Situation/Hospitalization: No - Comment as needed  Activities of Daily Living Home Assistive Devices/Equipment: Cane (specify quad or straight) ADL Screening (condition at time of admission) Patient's cognitive ability adequate to safely complete daily activities?: Yes Is the patient deaf or have difficulty hearing?: No Does the patient have difficulty seeing, even when wearing glasses/contacts?: No Does the patient have difficulty concentrating, remembering, or making decisions?:  No Patient able to express need for assistance with ADLs?: Yes Does the patient have difficulty dressing or bathing?: Yes Independently performs ADLs?: No Communication: Independent Dressing (OT): Needs assistance Is this a change from baseline?: Change from baseline, expected to last >3 days Grooming: Needs assistance Is this a change from baseline?: Change from baseline, expected to last >3 days Feeding: Independent Bathing: Needs assistance Is this a change from baseline?: Change from baseline, expected to last >3 days Toileting: Needs assistance Is this a change from baseline?: Change from baseline, expected to last >3days In/Out Bed: Needs assistance Is this a change from baseline?: Change from baseline, expected to last >3 days Walks in Home: Needs assistance Is this a change from baseline?: Pre-admission baseline Does the patient have difficulty walking or climbing stairs?: Yes Weakness of Legs: Both Weakness of Arms/Hands: None  Permission Sought/Granted      Emotional Assessment     Affect (typically observed): Pleasant (guard to going to SNF) Orientation: : Oriented to Self, Oriented to Place, Oriented to Situation Alcohol / Substance Use: Not Applicable Psych Involvement: No (comment)  Admission diagnosis:  Hyponatremia [E87.1] Closed fracture of distal end of right femur, initial encounter [S72.401A] Displaced comminuted fracture of shaft of right femur, initial encounter for closed fracture [S72.351A] Hypotension due to hypovolemia [E86.1] Patient Active Problem List   Diagnosis Date Noted   Displaced comminuted fracture of shaft of right femur, initial encounter for closed fracture 03/29/2023   Leukocytosis 03/29/2023   UTI (urinary tract infection) 11/09/2022   Hypotension 11/08/2022   Uncontrolled type 2 diabetes  mellitus with hypoglycemia, with long-term current use of insulin 11/08/2022   Gastroenteritis 11/08/2022   Generalized weakness 11/08/2022    CAP (community acquired pneumonia) 11/08/2022   Cocaine abuse 05/12/2017   Chronic pain disorder 04/11/2017   SVT (supraventricular tachycardia) 05/27/2016   Peripheral edema 05/27/2016   Dry eye 09/15/2014   Nail abnormality 09/15/2014   Melena 03/18/2014   Heme positive stool 03/18/2014   Essential hypertension, benign 01/22/2014   Mood disorder 09/16/2011   Diabetes mellitus 08/16/2011   Hyperlipidemia 08/16/2011   Osteoarthrosis involving lower leg 11/02/2009   PCP:  Benetta Spar, MD Pharmacy:   [facility] [place], [place] - [address] [place] [address] Phone: [phone] Fax: [phone]    Social Determinants of Health (SDOH) Social History: SDOH Screenings   Food Insecurity: No Food Insecurity (03/29/2023)  Housing: Low Risk  (03/29/2023)  Transportation Needs: No Transportation Needs (03/29/2023)  Utilities: Not At Risk (03/29/2023)  Tobacco Use: Low Risk  (03/31/2023)   SDOH Interventions:   Readmission Risk Interventions     No data to display

## 2023-04-03 NOTE — Discharge Instructions (Signed)

## 2023-04-03 NOTE — Progress Notes (Addendum)
PROGRESS NOTE    Miranda BLAZINA  ZOX:[id number] DOB: 1951-05-24 DOA: 03/29/2023 PCP: Benetta Spar, MD   Brief Narrative:    72 y.o. female with medical history significant of allergies, depression, diabetes mellitus, GERD, hyperlipidemia, substance abuse, and more presents the ED with a chief complaint of fall.  She was admitted for displaced comminuted fracture of right femoral shaft.  She underwent ORIF 4/19 and has been struggling with repeat episodes of SVT and now has new onset atrial fibrillation with RVR.  Awaiting transfer to SNF once stabilized.  Assessment & Plan:   Principal Problem:   Displaced comminuted fracture of shaft of right femur, initial encounter for closed fracture Active Problems:   Diabetes mellitus   Hyperlipidemia   Cocaine abuse   Hypotension   Leukocytosis  Assessment and Plan:   Displaced comminuted fracture of shaft of right femur status post ORIF 4/19 -S/p mechanical fall; x-ray showed comminuted displaced distal femoral shaft fracture -Underwent retrograde femoral nail for comminuted distal femur fracture. -Cardiology was consulted for preoperative cardiac clearance and SVT likely in setting of cocaine use -She was cleared by cardiology for surgery  -Orthopedics recommended to start aspirin 81 mg twice daily for 6 weeks for DVT prophylaxis, but will now be on Eliquis as below, follow-up with orthopedics outpatient in 2 weeks   SVT recurrent/new onset atrial fibrillation with RVR -Known history of SVT and required adenosine 4/17, likely triggered by cocaine use  -Cardiology was consulted, not started on beta-blocker due to recent cocaine use, but okay to use Coreg -Recommend to keep potassium more than 4.0, magnesium more than 2.0 -TSH 1.596 -Started on Coreg 3.125 mg twice daily, monitor on telemetry -Started on Eliquis for anticoagulation given CHA2DS2-VASc score of at least 3  Hypomagnesemia -Replete and reevaluate in a.m.    Hypertension -Started on Coreg as above   Cocaine abuse - Counseled on importance of cessation - UDS ordered and pending - TOC consult   Hyperlipidemia - Continue statin   Diabetes mellitus - Continue SSI -Semglee 37 units at bedtime with 3 units NovoLog 3 times daily -Hemoglobin A1c 8.5% 10/2022 -CBG has been elevated    DVT prophylaxis: Eliquis Code Status: Full Family Communication: None at bedside Disposition Plan:  Status is: Inpatient Remains inpatient appropriate because: Need for IV medications.  Consultants:  Orthopedics Cardiology  Procedures:  ORIF 4/19  Antimicrobials:  Anti-infectives (From admission, onward)    Start     Dose/Rate Route Frequency Ordered Stop   03/31/23 2200  ceFAZolin (ANCEF) IVPB 2g/100 mL premix        2 g 200 mL/hr over 30 Minutes Intravenous Every 8 hours 03/31/23 1609 04/01/23 1351   03/31/23 1132  ceFAZolin (ANCEF) 2-4 GM/100ML-% IVPB       Note to Pharmacy: Hale Bogus, Amy P: cabinet override      03/31/23 1132 03/31/23 2344   03/30/23 1200  ceFAZolin (ANCEF) IVPB 2g/100 mL premix        2 g 200 mL/hr over 30 Minutes Intravenous On call to O.R. 03/29/23 0350 03/31/23 0559       Subjective: Patient seen and evaluated today with elevated heart rates and no new atrial fibrillation with RVR.  Noted to have periods of SVT that resolved with Valsalva overnight.  Objective: Vitals:   04/02/23 1944 04/03/23 0113 04/03/23 0139 04/03/23 0550  BP: 132/72 (!) 152/82 108/67 (!) 149/91  Pulse: 86 91 91 92  Resp: Temp: 99.1 F (  37.3 C) 98.7 F (37.1 C)  98.5 F (36.9 C)  TempSrc: Oral Oral  Oral  SpO2: 96% 98% 100% 93%  Weight:      Height:        Intake/Output Summary (Last 24 hours) at 04/03/2023 1103 Last data filed at 04/03/2023 0900 Gross per 24 hour  Intake 240 ml  Output 1400 ml  Net -1160 ml   Filed Weights   03/29/23 0219  Weight: 63.5 kg    Examination:  General exam: Appears calm and  comfortable  Respiratory system: Clear to auscultation. Respiratory effort normal. Cardiovascular system: S1 & S2 heard, irregular and tachycardic Gastrointestinal system: Abdomen is soft Central nervous system: Alert and awake Extremities: No edema, dressings C/D/I Skin: No significant lesions noted Psychiatry: Flat affect.    Data Reviewed: I have personally reviewed following labs and imaging studies  CBC: Recent Labs  Lab 03/29/23 0230 03/29/23 0651 03/30/23 0409 03/31/23 0513 04/01/23 0439 04/03/23 0624  WBC 20.1* 13.8* 7.9 8.4 10.4 9.8  NEUTROABS 17.5* 11.6*  --   --   --   --   HGB 13.9 13.2 11.0* 10.4* 9.4* 10.0*  HCT 40.5 39.4 33.5* 31.7* 28.7* 30.3*  MCV 98.1 99.0 100.9* 101.9* 101.1* 101.3*  PLT 321 258 245 228 229 291   Basic Metabolic Panel: Recent Labs  Lab 03/29/23 0651 03/30/23 0409 03/31/23 0513 04/01/23 0439 04/03/23 0524 04/03/23 0624  NA 132* 134* 133* 133*  --  136  K 4.3 3.7 4.1 4.6  --  3.6  CL 97* 99 100 100  --  102  CO2 --  27  GLUCOSE 386* 51* 83 411*  --  79  BUN 23 26* 19 20  --  12  CREATININE 0.84 0.62 0.51 0.69  --  0.39*  CALCIUM 9.1 8.6* 8.2* 8.1*  --  8.7*  MG 1.4* 1.7 1.7  --  1.3*  --    GFR: Estimated Creatinine Clearance: 62.8 mL/min (A) (by C-G formula based on SCr of 0.39 mg/dL (L)). Liver Function Tests: Recent Labs  Lab 03/29/23 0651 04/01/23 0439  AST 16 13*  ALT 13 11  ALKPHOS 76 58  BILITOT 1.2 1.0  PROT 7.7 6.2*  ALBUMIN 3.2* 2.3*   No results for input(s): "LIPASE", "AMYLASE" in the last 168 hours. No results for input(s): "AMMONIA" in the last 168 hours. Coagulation Profile: Recent Labs  Lab 03/29/23 0230  INR 1.1   Cardiac Enzymes: No results for input(s): "CKTOTAL", "CKMB", "CKMBINDEX", "TROPONINI" in the last 168 hours. BNP (last 3 results) No results for input(s): "PROBNP" in the last 8760 hours. HbA1C: No results for input(s): "HGBA1C" in the last 72 hours. CBG: Recent Labs   Lab 04/02/23 1135 04/02/23 1537 04/02/23 2111 04/03/23 0733 04/03/23 1102  GLUCAP 296* 221* 190* 69* 154*   Lipid Profile: No results for input(s): "CHOL", "HDL", "LDLCALC", "TRIG", "CHOLHDL", "LDLDIRECT" in the last 72 hours. Thyroid Function Tests: No results for input(s): "TSH", "T4TOTAL", "FREET4", "T3FREE", "THYROIDAB" in the last 72 hours. Anemia Panel: No results for input(s): "VITAMINB12", "FOLATE", "FERRITIN", "TIBC", "IRON", "RETICCTPCT" in the last 72 hours. Sepsis Labs: No results for input(s): "PROCALCITON", "LATICACIDVEN" in the last 168 hours.  Recent Results (from the past 240 hour(s))  Surgical PCR screen     Status: None   Collection Time: 03/29/23  9:19 AM   Specimen: Nasal Mucosa; Nasal Swab  Result Value Ref Range Status   MRSA, PCR NEGATIVE NEGATIVE Final  Staphylococcus aureus NEGATIVE NEGATIVE Final    Comment: (NOTE) The Xpert SA Assay (FDA approved for NASAL specimens in patients 51 years of age and older), is one component of a comprehensive surveillance program. It is not intended to diagnose infection nor to guide or monitor treatment. Performed at [facility], [address] [place], [place] [address]          Radiology Studies: CT HEAD WO CONTRAST ( )  Result Date: 04/02/2023 CLINICAL DATA:  Altered mental status EXAM: CT HEAD WITHOUT CONTRAST TECHNIQUE: Contiguous axial images were obtained from the base of the skull through the vertex without intravenous contrast. RADIATION DOSE REDUCTION: This exam was performed according to the departmental dose-optimization program which includes automated exposure control, adjustment of the mA and/or kV according to patient size and/or use of iterative reconstruction technique. COMPARISON:  CT head 01/13/2023 FINDINGS: Brain: There is no acute intracranial hemorrhage, extra-axial fluid collection, or acute infarct. Parenchymal volume loss with prominence of the ventricular system and extra-axial CSF  spaces is unchanged. The ventricles are stable in size. Patchy hypodensity in the supratentorial white matter is unchanged, again likely reflecting sequela of underlying chronic small-vessel ischemic change. 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 The pituitary and suprasellar region are normal. There is no mass lesion. There is no mass effect or midline shift. Vascular: There is calcification of the bilateral carotid siphons and vertebral arteries. Skull: Normal. Negative for fracture or focal lesion. Sinuses/Orbits: The imaged paranasal sinuses are clear. The globes and orbits are unremarkable. Other: None. IMPRESSION: Stable noncontrast head CT with no acute intracranial pathology. Electronically Signed   By: Lesia Hausen M.D.   On: 04/02/2023 13:33        Scheduled Meds:  apixaban  5 mg Oral BID   atorvastatin  20 mg Oral Daily   carvedilol  3.125 mg Oral BID WC   gabapentin  100 mg Oral QHS   insulin aspart  0-15 Units Subcutaneous TID WC   insulin aspart  0-5 Units Subcutaneous QHS   insulin aspart  3 Units Subcutaneous TID WC   insulin glargine-yfgn  37 Units Subcutaneous QHS     LOS: 5 days    Time spent: 35 minutes    Gizella [place] Hoover Brunette, DO [facility]  If 7PM-7AM, please contact night-coverage [email or web address] 04/03/2023, 11:03 AM

## 2023-04-03 NOTE — Progress Notes (Signed)
Physical Therapy Treatment Patient Details Name: Miranda Sampson MRN: [id number] DOB: May 02, 1951 Today's Date: 04/03/2023   History of Present Illness Miranda Sampson is a 72 y.o. female with medical history significant of allergies, depression, diabetes mellitus, GERD, hyperlipidemia, substance abuse, and more presents the ED with a chief complaint of fall.  Patient reports that she was walking up the stairs when she missed 1 and fell backwards.  She sustained a Rt femur fx and had an ORIF on 4/19.  She is WBAT    PT Comments    Patient agreeable for therapy.  Patient demonstrates slow labored movement for sitting up at bedside with poor return for moving RLE due to increased pain, required repeated attempts before able to complete sit to stand, once standing limited to a few side steps before having to sit due to increased pain with right knee buckling and poor carryover for advancing RLE.  Patient tolerated sitting up in chair after therapy - nursing staff notified.  Patient will benefit from continued skilled physical therapy in hospital and recommended venue below to increase strength, balance, endurance for safe ADLs and gait.      Recommendations for follow up therapy are one component of a multi-disciplinary discharge planning process, led by the attending physician.  Recommendations may be updated based on patient status, additional functional criteria and insurance authorization.  Follow Up Recommendations  Can patient physically be transported by private vehicle: No    Assistance Recommended at Discharge Intermittent Supervision/Assistance  Patient can return home with the following A lot of help with bathing/dressing/bathroom;A lot of help with walking and/or transfers;Help with stairs or ramp for entrance;Assistance with cooking/housework   Equipment Recommendations  None recommended by PT    Recommendations for Other Services       Precautions / Restrictions  Precautions Precautions: Fall Restrictions Weight Bearing Restrictions: Yes RLE Weight Bearing: Weight bearing as tolerated     Mobility  Bed Mobility Overal bed mobility: Needs Assistance Bed Mobility: Supine to Sit     Supine to sit: Mod assist     General bed mobility comments: poor tolerance for moving RLE due to increased pain    Transfers Overall transfer level: Needs assistance Equipment used: Rolling walker (2 wheels) Transfers: Sit to/from Stand, Bed to chair/wheelchair/BSC Sit to Stand: Max assist   Step pivot transfers: Mod assist, Max assist       General transfer comment: required repeated verbal/tactile cueing for completing sit to stands    Ambulation/Gait Ambulation/Gait assistance: Mod assist, Max assist Gait Distance (Feet): 4 Feet Assistive device: Rolling walker (2 wheels) Gait Pattern/deviations: Decreased step length - left, Decreased stance time - right, Decreased stance time - left, Decreased stride length, Antalgic, Knees buckling Gait velocity: slow     General Gait Details: limited to a few side steps with poor return for advancing LLE due to increased pain, weakness   Stairs             Wheelchair Mobility    Modified Rankin (Stroke Patients Only)       Balance Overall balance assessment: Needs assistance Sitting-balance support: Feet supported, No upper extremity supported Sitting balance-Leahy Scale: Poor Sitting balance - Comments: fair/poor seated at EOB, fair supporting self with BUE   Standing balance support: During functional activity, Reliant on assistive device for balance, Bilateral upper extremity supported Standing balance-Leahy Scale: Poor Standing balance comment: using RW  Cognition Arousal/Alertness: Awake/alert Behavior During Therapy: WFL for tasks assessed/performed Overall Cognitive Status: Within Functional Limits for tasks assessed                                           Exercises General Exercises - Lower Extremity Ankle Circles/Pumps: Seated, AROM, Strengthening, Both, 10 reps Long Arc Quad: Seated, AAROM, Strengthening, Right, 10 reps Hip ABduction/ADduction: Seated, AROM, Strengthening, Right, 10 reps    General Comments        Pertinent Vitals/Pain Pain Assessment Pain Assessment: Faces Faces Pain Scale: Hurts whole lot Pain Location: RLE with movement, pressure Pain Descriptors / Indicators: Grimacing, Moaning, Guarding, Sharp Pain Intervention(s): Limited activity within patient's tolerance, Monitored during session, Premedicated before session, Repositioned    Home Living                          Prior Function            PT Goals (current goals can now be found in the care plan section) Progress towards PT goals: Progressing toward goals    Frequency    Min 4X/week      PT Plan Current plan remains appropriate    Co-evaluation              AM-PAC PT "6 Clicks" Mobility   Outcome Measure  Help needed turning from your back to your side while in a flat bed without using bedrails?: A Lot Help needed moving from lying on your back to sitting on the side of a flat bed without using bedrails?: A Lot Help needed moving to and from a bed to a chair (including a wheelchair)?: A Lot Help needed standing up from a chair using your arms (e.g., wheelchair or bedside chair)?: A Lot Help needed to walk in hospital room?: A Lot Help needed climbing 3-5 steps with a railing? : Total 6 Click Score: 11    End of Session   Activity Tolerance: Patient tolerated treatment well;Patient limited by fatigue;Patient limited by pain Patient left: in chair Nurse Communication: Mobility status PT Visit Diagnosis: Unsteadiness on feet (R26.81);History of falling (Z91.81);Muscle weakness (generalized) (M62.81);Pain Pain - Right/Left: Right Pain - part of body: Leg     Time: 1610-9604 PT Time  Calculation (min) (ACUTE ONLY): 23 min  Charges:  $Therapeutic Exercise: 8-22 mins $Therapeutic Activity: 8-22 mins                     1:52 PM, 04/03/23 Ocie Bob, MPT Physical Therapist with [facility] 336 [phone] office [phone] mobile phone

## 2023-04-03 NOTE — Inpatient Diabetes Management (Signed)
Inpatient Diabetes Program Recommendations  AACE/ADA: New Consensus Statement on Inpatient Glycemic Control   Target Ranges:  Prepandial:   less than 140 mg/dL      Peak postprandial:   less than 180 mg/dL (1-2 hours)      Critically ill patients:  140 - 180 mg/dL    Latest Reference Range & Units 04/03/23 07:33  Glucose-Capillary 70 - 99 mg/dL 69 (L)    Latest Reference Range & Units 04/02/23 07:42 04/02/23 11:35 04/02/23 15:37 04/02/23 21:11  Glucose-Capillary 70 - 99 mg/dL 161 (H) 096 (H) 045 (H) 190 (H)   Review of Glycemic Control  Diabetes history: DM2 Outpatient Diabetes medications: Lantus 40 units QHS, Tradjenta 5 mg daily Current orders for Inpatient glycemic control: Semglee 40 units QHS, Novolog 0-15 units TID with meals, Novolog 0-5 units QHS  Inpatient Diabetes Program Recommendations:    Insulin: CBG 69 mg/dl today; noted post prandial glucose is consistently elevated.  Please consider decreasing Semglee to 37 units QHS and ordering Novolog 3 units TID with meals for meal coverage if patient eats at least 50% of meals.  Thanks, Orlando Penner, RN, MSN, CDCES Diabetes Coordinator Inpatient Diabetes Program [phone] (Team Pager from 8am to 5pm)

## 2023-04-03 NOTE — TOC Benefit Eligibility Note (Signed)
Patient Advocate Encounter  Insurance verification completed.    The patient is currently admitted and upon discharge could be taking Eliquis 5 mg.  The current 30 day co-pay is $0.00.   The patient is insured through [employer] Medicare Part D   This test claim was processed through [facility]- copay amounts may vary at other pharmacies due to pharmacy/plan contracts, or as the patient moves through the different stages of their insurance plan.  Broughton Eppinger, CPHT Pharmacy Patient Advocate Specialist [facility] Patient Advocate Team Direct Number: [phone]  Fax: [phone]       

## 2023-04-03 NOTE — Progress Notes (Signed)
   04/03/23 1647  Spiritual Encounters  Type of Visit Initial  Care provided to: Patient  Referral source IDT Rounds  Reason for visit Routine spiritual support  OnCall Visit No  Spiritual Framework  Presenting Themes Significant life change  Community/Connection Family  Patient Stress Factors Loss of control  Family Stress Factors None identified  Interventions  Spiritual Care Interventions Made Compassionate presence;Prayer  Intervention Outcomes  Outcomes Connection to spiritual care;Awareness of support  Spiritual Care Plan  Spiritual Care Issues Still Outstanding Chaplain will continue to follow   Found Kenetra laying in hospital bed this afternoon and hard to rouse. Provided compassionate presence and prayer bedside. No family present bedside. Will continue to follow in order to provide spiritual support and to assess for spiritual need.   Rev. Jolyn Lent, M.Div. Chaplain

## 2023-04-04 DIAGNOSIS — E861 Hypovolemia: Secondary | ICD-10-CM

## 2023-04-04 DIAGNOSIS — S72351A Displaced comminuted fracture of shaft of right femur, initial encounter for closed fracture: Secondary | ICD-10-CM | POA: Diagnosis not present

## 2023-04-04 DIAGNOSIS — I471 Supraventricular tachycardia, unspecified: Secondary | ICD-10-CM

## 2023-04-04 LAB — GLUCOSE, CAPILLARY
Glucose-Capillary: 75 mg/dL (ref 70–99)
Glucose-Capillary: 149 mg/dL — ABNORMAL HIGH (ref 70–99)
Glucose-Capillary: 103 mg/dL — ABNORMAL HIGH (ref 70–99)
Glucose-Capillary: 242 mg/dL — ABNORMAL HIGH (ref 70–99)

## 2023-04-04 LAB — CBC
HCT: 28.6 % — ABNORMAL LOW (ref 36.0–46.0)
Hemoglobin: 9.2 g/dL — ABNORMAL LOW (ref 12.0–15.0)
MCH: 32.9 pg (ref 26.0–34.0)
MCHC: 32.2 g/dL (ref 30.0–36.0)
MCV: 102.1 fL — ABNORMAL HIGH (ref 80.0–100.0)
Platelets: 301 10*3/uL (ref 150–400)
RBC: 2.8 MIL/uL — ABNORMAL LOW (ref 3.87–5.11)
RDW: 13.6 % (ref 11.5–15.5)
WBC: 8.5 10*3/uL (ref 4.0–10.5)
nRBC: 0 % (ref 0.0–0.2)

## 2023-04-04 LAB — BASIC METABOLIC PANEL
Anion gap: 6 (ref 5–15)
BUN: 16 mg/dL (ref 8–23)
CO2: 27 mmol/L (ref 22–32)
Calcium: 8.4 mg/dL — ABNORMAL LOW (ref 8.9–10.3)
Chloride: 102 mmol/L (ref 98–111)
Creatinine, Ser: 0.44 mg/dL (ref 0.44–1.00)
GFR, Estimated: >60 mL/min (ref >60)
Glucose, Bld: 110 mg/dL — ABNORMAL HIGH (ref 70–99)
Potassium: 3.8 mmol/L (ref 3.5–5.1)
Sodium: 135 mmol/L (ref 135–145)

## 2023-04-04 LAB — LIPID PANEL
Cholesterol: 87 mg/dL (ref 0–200)
HDL: 28 mg/dL — ABNORMAL LOW (ref >40)
LDL Cholesterol: 50 mg/dL (ref 0–99)
Total CHOL/HDL Ratio: 3.1 RATIO
Triglycerides: 44 mg/dL (ref <150)
VLDL: 9 mg/dL (ref 0–40)

## 2023-04-04 LAB — HEMOGLOBIN A1C
Hgb A1c MFr Bld: 9.4 % — ABNORMAL HIGH (ref 4.8–5.6)
Mean Plasma Glucose: 223.08 mg/dL

## 2023-04-04 LAB — MAGNESIUM: Magnesium: 1.6 mg/dL — ABNORMAL LOW (ref 1.7–2.4)

## 2023-04-04 MED ORDER — ADENOSINE 6 MG/2ML IV SOLN: 6.0000 mg | Freq: Once | INTRAVENOUS | Status: DC

## 2023-04-04 MED ORDER — SODIUM CHLORIDE 0.9 % IV BOLUS
250.0000 mL | Freq: Once | INTRAVENOUS | Status: AC
  Administered 2023-04-04: 250 mL via INTRAVENOUS

## 2023-04-04 MED ORDER — MAGNESIUM SULFATE 2 GM/50ML IV SOLN
2.0000 g | Freq: Once | INTRAVENOUS | Status: AC
  Administered 2023-04-04: 2 g via INTRAVENOUS
  Filled 2023-04-04: qty 50

## 2023-04-04 MED ORDER — ORAL CARE MOUTH RINSE: 15.0000 mL | OROMUCOSAL | Status: DC | PRN

## 2023-04-04 MED ORDER — POTASSIUM CHLORIDE CRYS ER 20 MEQ PO TBCR
20.0000 meq | EXTENDED_RELEASE_TABLET | Freq: Once | ORAL | Status: AC
  Administered 2023-04-04: 20 meq via ORAL
  Filled 2023-04-04: qty 1

## 2023-04-04 MED ORDER — SODIUM CHLORIDE 0.9 % IV SOLN: INTRAVENOUS | Status: AC

## 2023-04-04 NOTE — Progress Notes (Signed)
Approximately 0715 patient converted to Afib RVR then ST in 140s. Attempts at vagal x3 and ice unsuccessful. At [phone] patient converted on her own to normal sinus in 80s. Will continue to monitor.

## 2023-04-04 NOTE — Progress Notes (Addendum)
Progress Note  Patient Name: Miranda Sampson Date of Encounter: 04/04/2023  Primary Cardiologist: Dina Rich, MD  Subjective   Cardiology asked to re-sign on for recurrent issues with PSVT, again this AM. Spontaneously converted to NSR prior to adenosine administration. Patient can feel SOB/palpitations when this is happening that fully resolve when back in normal rhythm. No CP.  Inpatient Medications    Scheduled Meds:  adenosine (ADENOCARD) IV  6 mg Intravenous Once   apixaban  5 mg Oral BID   atorvastatin  20 mg Oral Daily   carvedilol  3.125 mg Oral BID WC   gabapentin  100 mg Oral QHS   insulin aspart  0-15 Units Subcutaneous TID WC   insulin aspart  0-5 Units Subcutaneous QHS   insulin aspart  3 Units Subcutaneous TID WC   insulin glargine-yfgn  37 Units Subcutaneous QHS   potassium chloride  20 mEq Oral Once   Continuous Infusions:  PRN Meds: acetaminophen **OR** acetaminophen, albuterol, morphine injection, ondansetron **OR** ondansetron (ZOFRAN) IV   Vital Signs    Vitals:   04/04/23 0500 04/04/23 0750 04/04/23 0804 04/04/23 0928  BP: 110/63 100/60 (!) 100/58 (!) 86/55  Pulse: 64 (!) 140 (!) 143 81  Resp: 18 18 19 17   Temp: 97.8 F (36.6 C)     TempSrc: Oral     SpO2: 99% 98% 98% 94%  Weight:      Height:        Intake/Output Summary (Last 24 hours) at 04/04/2023 0932 Last data filed at 04/04/2023 0631 Gross per 24 hour  Intake 367.11 ml  Output --  Net 367.11 ml      03/29/2023    2:19 AM 12/30/2022    4:04 PM 03/22/2022    8:18 PM  Last 3 Weights  Weight (lbs) 140 lb 146 lb 130 lb  Weight (kg) 63.504 kg 66.225 kg 58.968 kg     Telemetry    See described below - Personally Reviewed  ECG    Today: SVT 141bpm no acute STT changes - Personally Reviewed  Physical Exam   GEN: No acute distress. Cachetic appearing. HEENT: Normocephalic, atraumatic, sclera non-icteric. Neck: No JVD or bruits. Cardiac: RRR no murmurs, rubs, or gallops.   Respiratory: Clear to auscultation bilaterally. Breathing is unlabored. GI: Soft, nontender, non-distended, BS +x 4. MS: no deformity. Extremities: No clubbing or cyanosis. No edema. Distal pedal pulses are 2+ and equal bilaterally. Neuro:  AAOx3. Follows commands. Psych:  Responds to questions appropriately with a flattened affect.  Labs    High Sensitivity Troponin:   Recent Labs  Lab 03/29/23 1851 03/29/23 2112  TROPONINIHS 6 6      Cardiac EnzymesNo results for input(s): "TROPONINI" in the last 168 hours. No results for input(s): "TROPIPOC" in the last 168 hours.   Chemistry Recent Labs  Lab 03/29/23 0651 03/30/23 0409 04/01/23 0439 04/03/23 0624 04/04/23 0430  NA 132*   < > 133* 136 135  K 4.3   < > 4.6 3.6 3.8  CL 97*   < > 100 102 102  CO2 25   < > 25 27 27   GLUCOSE 386*   < > 411* 79 110*  BUN 23   < > 20 12 16   CREATININE 0.84   < > 0.69 0.39* 0.44  CALCIUM 9.1   < > 8.1* 8.7* 8.4*  PROT 7.7  --  6.2*  --   --   ALBUMIN 3.2*  --  2.3*  --   --  AST 16  --  13*  --   --   ALT 13  --  11  --   --   ALKPHOS 76  --  58  --   --   BILITOT 1.2  --  1.0  --   --   GFRNONAA >60   < > >60 >60 >60  ANIONGAP 10   < > < > = values in this interval not displayed.     Hematology Recent Labs  Lab 04/01/23 0439 04/03/23 0624 04/04/23 0430  WBC 10.4 9.8 8.5  RBC 2.84* 2.99* 2.80*  HGB 9.4* 10.0* 9.2*  HCT 28.7* 30.3* 28.6*  MCV 101.1* 101.3* 102.1*  MCH 33.1 33.4 32.9  MCHC 32.8 33.0 32.2  RDW 13.2 13.4 13.6  PLT 229 291 301    BNPNo results for input(s): "BNP", "PROBNP" in the last 168 hours.   DDimer No results for input(s): "DDIMER" in the last 168 hours.   Radiology    CT HEAD WO CONTRAST ( )  Result Date: 04/02/2023 CLINICAL DATA:  Altered mental status EXAM: CT HEAD WITHOUT CONTRAST TECHNIQUE: Contiguous axial images were obtained from the base of the skull through the vertex without intravenous contrast. RADIATION DOSE REDUCTION: This  exam was performed according to the departmental dose-optimization program which includes automated exposure control, adjustment of the mA and/or kV according to patient size and/or use of iterative reconstruction technique. COMPARISON:  CT head 01/13/2023 FINDINGS: Brain: There is no acute intracranial hemorrhage, extra-axial fluid collection, or acute infarct. Parenchymal volume loss with prominence of the ventricular system and extra-axial CSF spaces is unchanged. The ventricles are stable in size. Patchy hypodensity in the supratentorial white matter is unchanged, again likely reflecting sequela of underlying chronic small-vessel ischemic change. 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 The pituitary and suprasellar region are normal. There is no mass lesion. There is no mass effect or midline shift. Vascular: There is calcification of the bilateral carotid siphons and vertebral arteries. Skull: Normal. Negative for fracture or focal lesion. Sinuses/Orbits: The imaged paranasal sinuses are clear. The globes and orbits are unremarkable. Other: None. IMPRESSION: Stable noncontrast head CT with no acute intracranial pathology. Electronically Signed   By: Lesia Hausen M.D.   On: 04/02/2023 13:33    Cardiac Studies   2d echo 01/17/23   1. Severely thickened speckled like appearing myocardium, thickened heart  valves, diastolic dysfunction. Consider possible cardiac amyloidosis,  consider cMRI for better evaluation. . Left ventricular ejection fraction,  by estimation, is 60 to 65%. The  left ventricle has normal function. The left ventricle has no regional  wall motion abnormalities. There is moderate left ventricular hypertrophy.  Left ventricular diastolic parameters are consistent with Grade II  diastolic dysfunction  (pseudonormalization). Elevated left atrial pressure.   2. Right ventricular systolic function is normal. The right ventricular  size is normal.  There is mildly elevated pulmonary artery systolic  pressure.   3. Left atrial size was mildly dilated.   4. Right atrial size was mildly dilated.   5. The mitral valve is abnormal. No evidence of mitral valve  regurgitation. Mild mitral stenosis. Moderate mitral annular  calcification.   6. The tricuspid valve is abnormal. Tricuspid valve regurgitation is mild  to moderate.   7. The aortic valve is tricuspid. There is mild calcification of the  aortic valve. There is mild thickening of the aortic valve. Aortic valve  regurgitation is not visualized.  No aortic stenosis is present.   8. The inferior vena cava is dilated in size with >50% respiratory  variability, suggesting right atrial pressure of 8 mmHg.    Patient Profile     72 y.o. female with PSVT (episode in 05/2016 and documented recurrence in 10/2022 in the settiing of hypotension and PNA), abnormal echo 01/2023 with speckled myocardium, DM, HTN, HLD, GERD and active cocaine use presented with mechanical fall and femur fracture. Cardiology originally consulted this admission 4/18 for paroxysmal SVT during thiss admission requiring adenosine + pre-op eval. Went to OR 03/31/23 for repair. Cardiology asked to re-sign on for recurrent SVT/AF. TSH wnl. Hypomagnesemia noted.  Assessment & Plan    1. Mechanicall fall, femur fracture - per IM, ortho  2. Recurrent PSVT - carvedilol restarted at lower dose yesterday but has had issues with hypotension so will hold pending discussion with MD - may require initiation of amiodarone - recommend goal K 4.0 or greater, Mg 2.0 or greater - IM repleting magnesium, will give KCl now - may benefit from f/u EP to consider ablation as OP  3. Newly recognized paroxysmal atrial fibrillation - noted on 4/22 - not captured on EKG but agree this is more irregular than her PSVT and carries a variable rate in the low 100s-120s - now on Eliquis - will discuss management with MD   4. Abnormal  echocardiogram - Echo in 01/2023 showed a severely thickened speckled like appearing myocardium, thickened heart valves and diastolic dysfunction which was concerning for possible cardiac amyloidosis with cMRI suggested for further evaluation - Can consider obtaining as an outpatient if compliant with follow-up. Does not appear to be a candidate for advanced therapies at this time given her continued substance abuse but would reconsider if this improves  5. ? Malnutrition - patient appears cachectic, low BMI, decreased albumin on earlier labs - will give IVF per d/w Dr. Jenene Slicker - will consult RD  6. Cocaine abuse - has been counseled on cessation  For questions or updates, please contact [facility] Please consult [email or web address] for contact info under Cardiology/STEMI.  Signed, Laurann Montana, PA-C 04/04/2023, 9:32 AM     Attending attestation  Patient seen and independently examined with Ronie Spies, PA-C. We discussed all aspects of the encounter. I agree with the assessment and plan as stated above.  Patient is currently admitted to the hospital after mechanical fall with resultant comminuted displaced distal femoral shaft fracture and currently is status post repair with intramedullary nail. Cardiology was initially consulted for preop cardiac clearance due to recurrent SVT that was treated with adenosine during this hospitalization.  Her last known history of PSVT, last documented recurrence was in 10/2022 at the time of pneumonia diagnosis. Cardiology recommended to initiate carvedilol for paroxysmal SVT management.  Patient had recurrent paroxysmal SVT/AVNRT in the setting of hypotension and pain. She was symptomatic with SOB. Reviewed telemetry strips which showed AVNRT today and yesterday. She had a few irregular beats at the time of initiation of SVT but otherwise has been pretty regular for the rest of the event. There is not much evidence to support the diagnosis of  atrial fibrillation at this time. Would avoid Eliquis. Continue to hold carvedilol due to low blood pressures. Recommend to start IV fluids, keep K>4 and <5, Mg>2 and <3. Pain management per primary. If patient has recurrences of SVT despite adequate hydration, she will need to be started on amiodarone drip for 24 hours followed by p.o. amiodarone  regimen. She will need to be seen by EP for EP study +/- ablation. Cocaine cessation counseling provided.  I have spent a total of 30 minutes with patient reviewing chart , telemetry, EKGs, labs and examining patient as well as establishing an assessment and plan that was discussed with the patient.  > 50% of time was spent in direct patient care.     Leman Martinek Verne Spurr, MD [place]  [facility]  1:53 PM

## 2023-04-04 NOTE — TOC Progression Note (Signed)
Transition of Care [facility]) - Progression Note    Patient Details  Name: Miranda Sampson MRN: [id number] Date of Birth: [birth year]  Transition of Care [facility]) CM/SW Contact  Leitha Bleak, RN Phone Number: 04/04/2023, 2:55 PM  Clinical Narrative:   Spoke to patient to discuss [facility], she is agreeable. DC planning for 1-2 days. Patient is having SVT's.  CMA starting INS AUTH with start date of 4/24.   Expected Discharge Plan: Skilled Nursing Facility Barriers to Discharge: Continued Medical Work up  Expected Discharge Plan and Services      Living arrangements for the past 2 months: Single Family Home                    Social Determinants of Health (SDOH) Interventions SDOH Screenings   Food Insecurity: No Food Insecurity (03/29/2023)  Housing: Low Risk  (03/29/2023)  Transportation Needs: No Transportation Needs (03/29/2023)  Utilities: Not At Risk (03/29/2023)  Tobacco Use: Low Risk  (03/31/2023)    Readmission Risk Interventions     No data to display

## 2023-04-04 NOTE — Progress Notes (Addendum)
Patients heart rate holding between 138-142, tele reported patient in A-fib/SVT. RT obtaining EKG. Blood pressure 74/56 in right arm and 82/58 in left arm, checked manually was 100/60. Patient reported complaints of shortness of breath and pain, oxygen saturation 98% at room air. EKG placed in chart. Marchelle Folks RN made aware at bedside. MD Sherryll Burger made aware. New orders placed. Per MD hold Coreg and Eliquis.

## 2023-04-04 NOTE — Progress Notes (Addendum)
Initial Nutrition Assessment  DOCUMENTATION CODES:      INTERVENTION:  Ensure Enlive po BID   Education-General Healthful Diet   NUTRITION DIAGNOSIS:   Increased nutrient needs related to post-op healing as evidenced by estimated needs.   GOAL:  Patient will meet greater than or equal to 90% of their needs  MONITOR:  PO intake, Skin, Weight trends, Labs  REASON FOR ASSESSMENT:   Consult Malnutrition Eval  ASSESSMENT: Patient is a 72 yo female from home with DM2, GERD, substance abuse (cocaine). Presents with displaced comminuted fracture of right femur- on 4/19 status post ORIF.   Patient affirms good appetite and is eating >50% of meals and likes to drink Ensure or Boost (chocolate). Lives alone but daughter lives nearby and helps with meals. Patient reports 3 meals daily - able to feed herself. Usual protein sources are chicken or fish. Encouraged consistent protein/energy intake. She likes vegetables and eats salads regularly.   Medications reviewed and include: novolog, lipitor  Patient weights reviewed- loss of <3 kg since January. UBW 140- 145 lb.    CBG (last 3)  Recent Labs    04/03/23 2057 04/04/23 0734 04/04/23 1110  GLUCAP 132* 75 149*        Latest Ref Rng & Units 04/04/2023    4:30 AM 04/03/2023    6:24 AM 04/01/2023    4:39 AM  BMP  Glucose 70 - 99 mg/dL 161  79  096   BUN 8 - 23 mg/dL Creatinine 0.44 - 1.00 mg/dL 0.45  4.09  8.11   Sodium 135 - 145 mmol/L 135  136  133   Potassium 3.5 - 5.1 mmol/L 3.8  3.6  4.6   Chloride 98 - 111 mmol/L 102  102  100   CO2 22 - 32 mmol/L Calcium 8.9 - 10.3 mg/dL 8.4  8.7  8.1       NUTRITION - FOCUSED PHYSICAL EXAM:  Flowsheet Row Most Recent Value  Orbital Region Mild depletion  Upper Arm Region Moderate depletion  Thoracic and Lumbar Region Mild depletion  Buccal Region Severe depletion  Temple Region Mild depletion  Clavicle Bone Region Moderate depletion  Clavicle and  Acromion Bone Region Moderate depletion  Dorsal Hand Moderate depletion  Edema (RD Assessment) Moderate  Hair Unable to assess  [covered]  Eyes Reviewed  Mouth Reviewed  Skin Reviewed  Nails Reviewed      Diet Order:   Diet Order             Diet Carb Modified Fluid consistency: Thin; Room service appropriate? Yes  Diet effective now                   EDUCATION NEEDS:  Education needs have been addressed  Skin:  Skin Assessment: Skin Integrity Issues: Skin Integrity Issues:: Incisions, Stage II Stage II: sacrum Incisions: right knee  Last BM:  4/21  Height:   Ht Readings from Last 1 Encounters:  03/31/23  (1.803 m)    Weight:   Wt Readings from Last 1 Encounters:  03/29/23 63.5 kg    Ideal Body Weight:   78 kg  BMI:  Body mass index is 19.53 kg/m.  Estimated Nutritional Needs:   Kcal:  1800-1950  Protein:  90-95 gr  Fluid:  >1500 ml daily  Royann Shivers MS,RD,CSG,LDN Contact: Loretha Stapler

## 2023-04-04 NOTE — Progress Notes (Signed)
OT Cancellation Note  Patient Details Name: Miranda Sampson MRN: [id number] DOB: 10/07/1951   Cancelled Treatment:    Reason Eval/Treat Not Completed: Medical issues which prohibited therapy. Evaluation held due to nursing stating that pt's HR and BP were abnormal today. Will attempted evaluation later when pt is more stable and as time permits.   Jakyah Bradby OT, MOT   Danie Chandler 04/04/2023, 9:26 AM

## 2023-04-04 NOTE — Progress Notes (Signed)
PROGRESS NOTE    Miranda Sampson  UEA:[id number] DOB: 11-02-1951 DOA: 03/29/2023 PCP: Benetta Spar, MD   Brief Narrative:    72 y.o. female with medical history significant of allergies, depression, diabetes mellitus, GERD, hyperlipidemia, substance abuse, and more presents the ED with a chief complaint of fall.  She was admitted for displaced comminuted fracture of right femoral shaft.  She underwent ORIF 4/19 and has been struggling with repeat episodes of SVT and now has new onset atrial fibrillation with RVR.  She continues to have recurrent SVT and cardiology reconsult to assist with management.  Assessment & Plan:   Principal Problem:   Displaced comminuted fracture of shaft of right femur, initial encounter for closed fracture Active Problems:   Diabetes mellitus   Hyperlipidemia   Cocaine abuse   Hypotension   Leukocytosis  Assessment and Plan:   Displaced comminuted fracture of shaft of right femur status post ORIF 4/19 -S/p mechanical fall; x-ray showed comminuted displaced distal femoral shaft fracture -Underwent retrograde femoral nail for comminuted distal femur fracture. -Cardiology was consulted for preoperative cardiac clearance and SVT likely in setting of cocaine use -She was cleared by cardiology for surgery  -Orthopedics recommended to start aspirin 81 mg twice daily for 6 weeks for DVT prophylaxis, but will now be on Eliquis as below, follow-up with orthopedics outpatient in 2 weeks   SVT recurrent/new onset atrial fibrillation with RVR -Known history of SVT and required adenosine 4/17, likely triggered by cocaine use  -Cardiology was consulted, not started on beta-blocker due to recent cocaine use, but okay to use Coreg -Recommend to keep potassium more than 4.0, magnesium more than 2.0 -TSH 1.596 -Coreg held for now given some hypotension, may require initiation of amiodarone per cardiology 4/23 -Started on Eliquis for anticoagulation given  CHA2DS2-VASc score of at least 3  Hypomagnesemia -Replete and reevaluate in a.m.   Hypertension -Now with some hypotension and Coreg held   Cocaine abuse - Counseled on importance of cessation - UDS ordered and pending - TOC consult   Hyperlipidemia - Continue statin   Diabetes mellitus - Continue SSI -Semglee 37 units at bedtime with 3 units NovoLog 3 times daily -Hemoglobin A1c 8.5% 10/2022 -CBG has been elevated    DVT prophylaxis: Eliquis Code Status: Full Family Communication: None at bedside Disposition Plan:  Status is: Inpatient Remains inpatient appropriate because: Need for IV medications.  Consultants:  Orthopedics Cardiology  Procedures:  ORIF 4/19  Antimicrobials:  Anti-infectives (From admission, onward)    Start     Dose/Rate Route Frequency Ordered Stop   03/31/23 2200  ceFAZolin (ANCEF) IVPB 2g/100 mL premix        2 g 200 mL/hr over 30 Minutes Intravenous Every 8 hours 03/31/23 1609 04/01/23 1351   03/31/23 1132  ceFAZolin (ANCEF) 2-4 GM/100ML-% IVPB       Note to Pharmacy: Hale Bogus, Amy P: cabinet override      03/31/23 1132 03/31/23 2344   03/30/23 1200  ceFAZolin (ANCEF) IVPB 2g/100 mL premix        2 g 200 mL/hr over 30 Minutes Intravenous On call to O.R. 03/29/23 0350 03/31/23 0559       Subjective: Patient seen and evaluated today with elevated heart rates and noted atrial fibrillation on telemetry.  Noted to have periods of SVT that almost required adenosine this morning.  Objective: Vitals:   04/04/23 0928 04/04/23 0938 04/04/23 1138 04/04/23 1141  BP: (!) 86/55 (!) 92/56 94/61 (!) 102/58  Pulse: 81 83 72   Resp: 17  18   Temp: 97.7 F (36.5 C)  98.4 F (36.9 C)   TempSrc:   Oral   SpO2: 94%  98%   Weight:      Height:        Intake/Output Summary (Last 24 hours) at 04/04/2023 1250 Last data filed at 04/04/2023 1156 Gross per 24 hour  Intake 367.11 ml  Output 350 ml  Net 17.11 ml   Filed Weights   03/29/23 0219   Weight: 63.5 kg    Examination:  General exam: Appears calm and comfortable  Respiratory system: Clear to auscultation. Respiratory effort normal. Cardiovascular system: S1 & S2 heard, irregular and tachycardic Gastrointestinal system: Abdomen is soft Central nervous system: Alert and awake Extremities: No edema, dressings C/D/I Skin: No significant lesions noted Psychiatry: Flat affect.    Data Reviewed: I have personally reviewed following labs and imaging studies  CBC: Recent Labs  Lab 03/29/23 0230 03/29/23 0651 03/30/23 0409 03/31/23 0513 04/01/23 0439 04/03/23 0624 04/04/23 0430  WBC 20.1* 13.8* 7.9 8.4 10.4 9.8 8.5  NEUTROABS 17.5* 11.6*  --   --   --   --   --   HGB 13.9 13.2 11.0* 10.4* 9.4* 10.0* 9.2*  HCT 40.5 39.4 33.5* 31.7* 28.7* 30.3* 28.6*  MCV 98.1 99.0 100.9* 101.9* 101.1* 101.3* 102.1*  PLT 321 258 245 228 229 291 301   Basic Metabolic Panel: Recent Labs  Lab 03/29/23 0651 03/30/23 0409 03/31/23 0513 04/01/23 0439 04/03/23 0524 04/03/23 0624 04/04/23 0430  NA 132* 134* 133* 133*  --  136 135  K 4.3 3.7 4.1 4.6  --  3.6 3.8  CL 97* 99 100 100  --  102 102  CO2 --  27 27  GLUCOSE 386* 51* 83 411*  --  79 110*  BUN 23 26* 19 20  --  12 16  CREATININE 0.84 0.62 0.51 0.69  --  0.39* 0.44  CALCIUM 9.1 8.6* 8.2* 8.1*  --  8.7* 8.4*  MG 1.4* 1.7 1.7  --  1.3*  --  1.6*   GFR: Estimated Creatinine Clearance: 62.8 mL/min (by C-G formula based on SCr of 0.44 mg/dL). Liver Function Tests: Recent Labs  Lab 03/29/23 0651 04/01/23 0439  AST 16 13*  ALT 13 11  ALKPHOS 76 58  BILITOT 1.2 1.0  PROT 7.7 6.2*  ALBUMIN 3.2* 2.3*   No results for input(s): "LIPASE", "AMYLASE" in the last 168 hours. No results for input(s): "AMMONIA" in the last 168 hours. Coagulation Profile: Recent Labs  Lab 03/29/23 0230  INR 1.1   Cardiac Enzymes: No results for input(s): "CKTOTAL", "CKMB", "CKMBINDEX", "TROPONINI" in the last 168  hours. BNP (last 3 results) No results for input(s): "PROBNP" in the last 8760 hours. HbA1C: Recent Labs    04/04/23 0430  HGBA1C 9.4*   CBG: Recent Labs  Lab 04/03/23 1102 04/03/23 1621 04/03/23 2057 04/04/23 0734 04/04/23 1110  GLUCAP 154* 184* 132* 75 149*   Lipid Profile: Recent Labs    04/04/23 0430  CHOL 87  HDL 28*  LDLCALC 50  TRIG 44  CHOLHDL 3.1   Thyroid Function Tests: No results for input(s): "TSH", "T4TOTAL", "FREET4", "T3FREE", "THYROIDAB" in the last 72 hours. Anemia Panel: No results for input(s): "VITAMINB12", "FOLATE", "FERRITIN", "TIBC", "IRON", "RETICCTPCT" in the last 72 hours. Sepsis Labs: No results for input(s): "PROCALCITON", "LATICACIDVEN" in the last 168 hours.  Recent Results (from  the past 240 hour(s))  Surgical PCR screen     Status: None   Collection Time: 03/29/23  9:19 AM   Specimen: Nasal Mucosa; Nasal Swab  Result Value Ref Range Status   MRSA, PCR NEGATIVE NEGATIVE Final   Staphylococcus aureus NEGATIVE NEGATIVE Final    Comment: (NOTE) The Xpert SA Assay (FDA approved for NASAL specimens in patients 57 years of age and older), is one component of a comprehensive surveillance program. It is not intended to diagnose infection nor to guide or monitor treatment. Performed at [facility], [address], [place], [place] [address]          Radiology Studies: No results found.      Scheduled Meds:  apixaban  5 mg Oral BID   atorvastatin  20 mg Oral Daily   gabapentin  100 mg Oral QHS   insulin aspart  0-15 Units Subcutaneous TID WC   insulin aspart  0-5 Units Subcutaneous QHS   insulin aspart  3 Units Subcutaneous TID WC   insulin glargine-yfgn  37 Units Subcutaneous QHS     LOS: 6 days    Time spent: 35 minutes    Brendin Situ Hoover Brunette, DO [facility]  If 7PM-7AM, please contact night-coverage [email or web address] 04/04/2023, 12:50 PM

## 2023-04-04 NOTE — Progress Notes (Signed)
   04/04/23 0928  Assess: MEWS Score  Temp 97.7 F (36.5 C)  BP (!) 86/55  MAP (mmHg) 65  Pulse Rate 81  Resp 17  SpO2 94 %  Assess: MEWS Score  MEWS Temp 0  MEWS Systolic 1  MEWS Pulse 0  MEWS RR 0  MEWS LOC 0  MEWS Score 1  MEWS Score Color Green  Assess: if the MEWS score is Yellow or Red  Were vital signs taken at a resting state? Yes  Focused Assessment Change from prior assessment (see assessment flowsheet)  Does the patient meet 2 or more of the SIRS criteria? No  Assess: SIRS CRITERIA  SIRS Temperature  0  SIRS Pulse 0  SIRS Respirations  0  SIRS WBC 0  SIRS Score Sum  0

## 2023-04-04 NOTE — Progress Notes (Signed)
   04/04/23 0750  Assess: MEWS Score  BP 100/60  Pulse Rate (!) 140  Resp 18  SpO2 98 %  O2 Device Room Air  Assess: MEWS Score  MEWS Temp 0  MEWS Systolic 1  MEWS Pulse 3  MEWS RR 0  MEWS LOC 0  MEWS Score 4  MEWS Score Color Red  Assess: if the MEWS score is Yellow or Red  Were vital signs taken at a resting state? Yes  Focused Assessment Change from prior assessment (see assessment flowsheet)  Does the patient meet 2 or more of the SIRS criteria? No  MEWS guidelines implemented  Yes, red  Treat  MEWS Interventions Considered administering scheduled or prn medications/treatments as ordered  Take Vital Signs  Increase Vital Sign Frequency  Red: Q1hr x2, continue Q4hrs until patient remains green for 12hrs  Escalate  MEWS: Escalate Red: Discuss with charge nurse and notify provider. Consider notifying RRT. If remains red for 2 hours consider need for higher level of care  Notify: Charge Nurse/RN  Name of Charge Nurse/RN Notified Health visitor  Provider Notification  Provider Name/Title MD Sherryll Burger  Date Provider Notified 04/04/23  Time Provider Notified [phone]  Method of Notification Page (Secure chat)  Notification Reason Other (Comment) (Red MEWS)  Provider response See new orders  Date of Provider Response 04/04/23  Time of Provider Response 0800  Assess: SIRS CRITERIA  SIRS Temperature  0  SIRS Pulse 1  SIRS Respirations  0  SIRS WBC 0  SIRS Score Sum  1

## 2023-04-05 ENCOUNTER — Encounter (HOSPITAL_COMMUNITY): Payer: Self-pay | Admitting: Orthopedic Surgery

## 2023-04-05 DIAGNOSIS — I4719 Other supraventricular tachycardia: Secondary | ICD-10-CM

## 2023-04-05 DIAGNOSIS — F1490 Cocaine use, unspecified, uncomplicated: Secondary | ICD-10-CM

## 2023-04-05 LAB — BASIC METABOLIC PANEL
Anion gap: 6 (ref 5–15)
BUN: 14 mg/dL (ref 8–23)
CO2: 27 mmol/L (ref 22–32)
Calcium: 8.2 mg/dL — ABNORMAL LOW (ref 8.9–10.3)
Chloride: 102 mmol/L (ref 98–111)
Creatinine, Ser: 0.41 mg/dL — ABNORMAL LOW (ref 0.44–1.00)
GFR, Estimated: >60 mL/min (ref >60)
Glucose, Bld: 94 mg/dL (ref 70–99)
Potassium: 3.8 mmol/L (ref 3.5–5.1)
Sodium: 135 mmol/L (ref 135–145)

## 2023-04-05 LAB — MAGNESIUM: Magnesium: 1.7 mg/dL (ref 1.7–2.4)

## 2023-04-05 LAB — CBC
HCT: 30.2 % — ABNORMAL LOW (ref 36.0–46.0)
Hemoglobin: 9.7 g/dL — ABNORMAL LOW (ref 12.0–15.0)
MCH: 32.8 pg (ref 26.0–34.0)
MCHC: 32.1 g/dL (ref 30.0–36.0)
MCV: 102 fL — ABNORMAL HIGH (ref 80.0–100.0)
Platelets: 347 10*3/uL (ref 150–400)
RBC: 2.96 MIL/uL — ABNORMAL LOW (ref 3.87–5.11)
RDW: 13.7 % (ref 11.5–15.5)
WBC: 8 10*3/uL (ref 4.0–10.5)
nRBC: 0 % (ref 0.0–0.2)

## 2023-04-05 LAB — GLUCOSE, CAPILLARY
Glucose-Capillary: 69 mg/dL — ABNORMAL LOW (ref 70–99)
Glucose-Capillary: 141 mg/dL — ABNORMAL HIGH (ref 70–99)
Glucose-Capillary: 165 mg/dL — ABNORMAL HIGH (ref 70–99)
Glucose-Capillary: 165 mg/dL — ABNORMAL HIGH (ref 70–99)

## 2023-04-05 MED ORDER — ACETAMINOPHEN 325 MG PO TABS: 650.0000 mg | ORAL_TABLET | Freq: Four times a day (QID) | ORAL | 0 refills | Status: AC | PRN

## 2023-04-05 MED ORDER — POTASSIUM CHLORIDE CRYS ER 20 MEQ PO TBCR
20.0000 meq | EXTENDED_RELEASE_TABLET | Freq: Once | ORAL | Status: AC
  Administered 2023-04-05: 20 meq via ORAL
  Filled 2023-04-05: qty 1

## 2023-04-05 MED ORDER — MAGNESIUM SULFATE 2 GM/50ML IV SOLN
2.0000 g | Freq: Once | INTRAVENOUS | Status: AC
  Administered 2023-04-05: 2 g via INTRAVENOUS
  Filled 2023-04-05: qty 50

## 2023-04-05 MED ORDER — INSULIN ASPART 100 UNIT/ML IJ SOLN: 3.0000 [IU] | Freq: Three times a day (TID) | INTRAMUSCULAR | 11 refills | Status: AC

## 2023-04-05 MED ORDER — DILTIAZEM HCL 60 MG PO TABS: 60.0000 mg | ORAL_TABLET | Freq: Every day | ORAL | 0 refills | Status: AC | PRN

## 2023-04-05 MED ORDER — DILTIAZEM HCL 60 MG PO TABS: 60.0000 mg | ORAL_TABLET | Freq: Every day | ORAL | Status: DC | PRN

## 2023-04-05 MED ORDER — INSULIN GLARGINE-YFGN 100 UNIT/ML ~~LOC~~ SOLN: 37.0000 [IU] | Freq: Every day | SUBCUTANEOUS | 11 refills | Status: AC

## 2023-04-05 MED ORDER — APIXABAN 5 MG PO TABS: 5.0000 mg | ORAL_TABLET | Freq: Two times a day (BID) | ORAL | 3 refills | Status: AC

## 2023-04-05 NOTE — Plan of Care (Signed)
?  Problem: Education: ?Goal: Ability to demonstrate management of disease process will improve ?Outcome: Not Applicable ?Goal: Ability to verbalize understanding of medication therapies will improve ?Outcome: Not Applicable ?Goal: Individualized Educational Video(s) ?Outcome: Not Applicable ?  ?Problem: Activity: ?Goal: Capacity to carry out activities will improve ?Outcome: Not Applicable ?  ?Problem: Cardiac: ?Goal: Ability to achieve and maintain adequate cardiopulmonary perfusion will improve ?Outcome: Not Applicable ?  ?

## 2023-04-05 NOTE — Progress Notes (Addendum)
Rounding Note    Patient Name: Miranda Sampson Date of Encounter: 04/05/2023  [facility] Cardiologist: Dina Rich, MD   Subjective   Breathing at baseline. No chest pain or palpitations. Maintaining NSR by review of telemetry.   Inpatient Medications    Scheduled Meds:  apixaban  5 mg Oral BID   atorvastatin  20 mg Oral Daily   gabapentin  100 mg Oral QHS   insulin aspart  0-15 Units Subcutaneous TID WC   insulin aspart  0-5 Units Subcutaneous QHS   insulin aspart  3 Units Subcutaneous TID WC   insulin glargine-yfgn  37 Units Subcutaneous QHS   Continuous Infusions:  PRN Meds: acetaminophen **OR** acetaminophen, albuterol, morphine injection, ondansetron **OR** ondansetron (ZOFRAN) IV, mouth rinse   Vital Signs    Vitals:   04/04/23 1355 04/04/23 1747 04/04/23 2059 04/05/23 0351  BP: 103/74 108/63 110/76 108/67  Pulse: 72 70 73 65  Resp: Temp: 98.3 F (36.8 C) 98.9 F (37.2 C) 98.4 F (36.9 C) 98.5 F (36.9 C)  TempSrc: Oral Oral Oral   SpO2: 98% 100% 97% 98%  Weight:      Height:        Intake/Output Summary (Last 24 hours) at 04/05/2023 1016 Last data filed at 04/05/2023 6962 Gross per 24 hour  Intake 1510 ml  Output 650 ml  Net 860 ml      03/29/2023    2:19 AM 12/30/2022    4:04 PM 03/22/2022    8:18 PM  Last 3 Weights  Weight (lbs) 140 lb 146 lb 130 lb  Weight (kg) 63.504 kg 66.225 kg 58.968 kg      Telemetry    NSR, HR in 60's to 70's. No recurrent SVT.   - Personally Reviewed  ECG    No new tracings.   Physical Exam   GEN: Thin female appearing in no acute distress.   Neck: No JVD Cardiac: RRR, no murmurs, rubs, or gallops.  Respiratory: Clear to auscultation bilaterally. GI: Soft, nontender, non-distended  MS: No pitting edema; No deformity. Neuro:  Nonfocal  Psych: Normal affect   Labs    High Sensitivity Troponin:   Recent Labs  Lab 03/29/23 1851 03/29/23 2112  TROPONINIHS 6 6      Chemistry Recent Labs  Lab 04/01/23 0439 04/03/23 0524 04/03/23 0624 04/04/23 0430 04/05/23 0419  NA 133*  --  136 135 135  K 4.6  --  3.6 3.8 3.8  CL 100  --  102 102 102  CO2 25  --  GLUCOSE 411*  --  79 110* 94  BUN 20  --  CREATININE 0.69  --  0.39* 0.44 0.41*  CALCIUM 8.1*  --  8.7* 8.4* 8.2*  MG  --  1.3*  --  1.6* 1.7  PROT 6.2*  --   --   --   --   ALBUMIN 2.3*  --   --   --   --   AST 13*  --   --   --   --   ALT 11  --   --   --   --   ALKPHOS 58  --   --   --   --   BILITOT 1.0  --   --   --   --   GFRNONAA >60  --  >60 >60 >60  ANIONGAP 8  --  7 6 6     Lipids  Recent Labs  Lab 04/04/23 0430  CHOL 87  TRIG 44  HDL 28*  LDLCALC 50  CHOLHDL 3.1    Hematology Recent Labs  Lab 04/03/23 0624 04/04/23 0430 04/05/23 0419  WBC 9.8 8.5 8.0  RBC 2.99* 2.80* 2.96*  HGB 10.0* 9.2* 9.7*  HCT 30.3* 28.6* 30.2*  MCV 101.3* 102.1* 102.0*  MCH 33.4 32.9 32.8  MCHC 33.0 32.2 32.1  RDW 13.4 13.6 13.7  PLT 291 301 347   Thyroid  Recent Labs  Lab 03/31/23 0513  TSH 1.596    BNPNo results for input(s): "BNP", "PROBNP" in the last 168 hours.  DDimer No results for input(s): "DDIMER" in the last 168 hours.   Radiology    No results found.  Cardiac Studies   Echocardiogram: 01/17/2023 IMPRESSIONS     1. Severely thickened speckled like appearing myocardium, thickened heart  valves, diastolic dysfunction. Consider possible cardiac amyloidosis,  consider cMRI for better evaluation. . Left ventricular ejection fraction,  by estimation, is 60 to 65%. The  left ventricle has normal function. The left ventricle has no regional  wall motion abnormalities. There is moderate left ventricular hypertrophy.  Left ventricular diastolic parameters are consistent with Grade II  diastolic dysfunction  (pseudonormalization). Elevated left atrial pressure.   2. Right ventricular systolic function is normal. The right ventricular  size is normal.  There is mildly elevated pulmonary artery systolic  pressure.   3. Left atrial size was mildly dilated.   4. Right atrial size was mildly dilated.   5. The mitral valve is abnormal. No evidence of mitral valve  regurgitation. Mild mitral stenosis. Moderate mitral annular  calcification.   6. The tricuspid valve is abnormal. Tricuspid valve regurgitation is mild  to moderate.   7. The aortic valve is tricuspid. There is mild calcification of the  aortic valve. There is mild thickening of the aortic valve. Aortic valve  regurgitation is not visualized. No aortic stenosis is present.   8. The inferior vena cava is dilated in size with >50% respiratory  variability, suggesting right atrial pressure of 8 mmHg.    Patient Profile     72 y.o. 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 Cardiology consulted due to recurrent SVT.   Assessment & Plan    1. Femur Fracture - Underwent surgical repair on 03/31/2023. Planning for discharge to SNF by review of notes. Management per Hospitalist and Orthopedics.    2. SVT - She has a history of this and recurrent episodes this admission. Had used Cocaine the day prior to admission as well. TSH WNL this admission. Labs today show K+ at 3.8 and Mg 1.7. Will order supplementation to keep K+ ~ 4.0 and Mg ~ 2.0. - Intolerant to Coreg due to hypotension and Lopressor not ideal given her cocaine use. Reviewed with Dr. Corinna Lines and will provide with an Rx for PRN short-acting Cardizem 60mg  to take daily as needed.  - EP consult previously recommended given her recurrent SVT. Will refer to EP in the outpatient setting.      3. Paroxysmal Atrial Fibrillation - Was noted on telemetry on 04/03/2023 and she was started on Eliquis 5mg  BID for anticoagulation which is the appropriate dose  given her age, weight and renal function.   4.  Abnormal Echocardiogram - Echo in 01/2023 showed a severely thickened speckled like appearing myocardium, thickened heart valves and diastolic dysfunction which was concerning for possible cardiac amyloidosis with cMRI recommended for further evaluation.  - Consider obtaining as an outpatient if compliant with follow-up. Likely not a candidate for advanced therapies given her continued substance abuse.    5. Cocaine Abuse - Reported using cocaine the evening prior to admission. Cessation has been advised.  For questions or updates, please contact [facility] Please consult [email or web address] for contact info under        Signed, Ellsworth Lennox, PA-C  04/05/2023, 10:16 AM     Attending attestation  Patient seen and independently examined with Randall An, PA-C. We discussed all aspects of the encounter. I agree with the assessment and plan as stated above.  No acute events overnight, had IV fluids yesterday. No recurrence of SVT the last 24 hours after administering IV fluids. Carvedilol is on hold. Patient has no symptoms.  Physical examination is remarkable for patient not in acute respite distress, HEENT normal, JVD normal, S1-S2 normal, no murmur, clear lungs, no pitting edema in bilateral lower extremities.  # Paroxysmal SVT/AVNRT: Did not tolerate carvedilol due to hypotension.  Urine toxicology positive for cocaine. Counseled patient to avoid cocaine, she says she will think about it. Avoid dehydration.  Vagal maneuvers and please start diltiazem 60 mg daily as needed for palpitations/SOB (initially had symptom of SOB with SVT).  Outpatient EP follow-up for SVT ablation.  # Paroxysmal atrial tachycardia: I reviewed the telemetry from 04/03/2023 that showed atrial tachycardia paroxys with PACs. There are P waves before the QRS complex. I do not think she has atrial fibrillation but even if she does, it was only for a few  minutes and does not need systemic anticoagulation.  [facility] will sign off.   Medication Recommendations: P.o. diltiazem 60 mg daily as needed for palpitations/SOB Other recommendations (labs, testing, etc): None Follow up as an outpatient: Cardiology follow-up in 1 month upon discharge, ambulatory referral to electrophysiology for SVT ablation   I have spent a total of 30 minutes with patient reviewing chart , telemetry, EKGs, labs and examining patient as well as establishing an assessment and plan that was discussed with the patient.  > 50% of time was spent in direct patient care.     Shereda Graw Verne Spurr, MD [place]  [facility]  10:55 AM

## 2023-04-05 NOTE — TOC Transition Note (Signed)
Transition of Care [facility]) - CM/SW Discharge Note   Patient Details  Name: Miranda Sampson MRN: [id number] Date of Birth: February 14, 1951  Transition of Care [facility]) CM/SW Contact:  Villa Herb, LCSWA Phone Number: 04/05/2023, 12:34 PM   Clinical Narrative:    CSW updated that pt is medically ready. CSW left HIPAA compliant VM for pts daughter. CSW spoke to [place] with [facility] who states they are ready for pt to admit today. CSW updated RN with room and report numbers. RN states pt will need EMS transport. Med necessity form completed and printed to the floor. EMS called for transport. TOC signing off.   Final next level of care: Skilled Nursing Facility Barriers to Discharge: Barriers Resolved   Patient Goals and CMS Choice CMS Medicare.gov Compare Post Acute Care list provided to:: Patient Choice offered to / list presented to : Patient  Discharge Placement                  Patient to be transferred to facility by: EMS Name of family member notified: daughter Patient and family notified of of transfer: 04/05/23  Discharge Plan and Services Additional resources added to the After Visit Summary for                                       Social Determinants of Health (SDOH) Interventions SDOH Screenings   Food Insecurity: No Food Insecurity (03/29/2023)  Housing: Low Risk  (03/29/2023)  Transportation Needs: No Transportation Needs (03/29/2023)  Utilities: Not At Risk (03/29/2023)  Tobacco Use: Low Risk  (03/31/2023)     Readmission Risk Interventions     No data to display

## 2023-04-05 NOTE — Plan of Care (Signed)
  Problem: Acute Rehab OT Goals (only OT should resolve) Goal: Pt. Will Perform Grooming Flowsheets (Taken 04/05/2023 0943) Pt Will Perform Grooming:  with min guard assist  standing Goal: Pt. Will Perform Upper Body Bathing Flowsheets (Taken 04/05/2023 0943) Pt Will Perform Upper Body Bathing:  with modified independence  sitting Goal: Pt. Will Perform Lower Body Bathing Flowsheets (Taken 04/05/2023 0943) Pt Will Perform Lower Body Bathing:  with min guard assist  sitting/lateral leans  with adaptive equipment Goal: Pt. Will Perform Lower Body Dressing Flowsheets (Taken 04/05/2023 0943) Pt Will Perform Lower Body Dressing:  with min guard assist  with adaptive equipment  sitting/lateral leans Goal: Pt. Will Perform Toileting-Clothing Manipulation Flowsheets (Taken 04/05/2023 0943) Pt Will Perform Toileting - Clothing Manipulation and hygiene:  with min guard assist  sitting/lateral leans Goal: Pt/Caregiver Will Perform Home Exercise Program Flowsheets (Taken 04/05/2023 [phone]) Pt/caregiver will Perform Home Exercise Program:  Increased strength  Both right and left upper extremity  Independently  Ivylynn Hoppes OT, MOT

## 2023-04-05 NOTE — NC FL2 (Signed)
[place] MEDICAID FL2 LEVEL OF CARE FORM     IDENTIFICATION  Patient Name: Miranda Sampson Birthdate: 1951/12/10 Sex: female Admission Date (Current Location): 03/29/2023  [facility] and [place] Number:  [employer] and Address:  [facility],  [address] [place] [address]      Provider Number: [id number]  Attending Physician Name and Address:  Kendell Bane, MD  Relative Name and Phone Number:  Doran Heater (Daughter)  [phone]    Current Level of Care: Hospital Recommended Level of Care: Skilled Nursing Facility Prior Approval Number:    Date Approved/Denied:   PASRR Number: [id number]  Discharge Plan: SNF    Current Diagnoses: Patient Active Problem List   Diagnosis Date Noted   Atrial tachycardia 04/05/2023   Cocaine use 04/05/2023   Displaced comminuted fracture of shaft of right femur, initial encounter for closed fracture 03/29/2023   Leukocytosis 03/29/2023   UTI (urinary tract infection) 11/09/2022   Hypotension 11/08/2022   Uncontrolled type 2 diabetes mellitus with hypoglycemia, with long-term current use of insulin 11/08/2022   Gastroenteritis 11/08/2022   Generalized weakness 11/08/2022   CAP (community acquired pneumonia) 11/08/2022   Cocaine abuse 05/12/2017   Chronic pain disorder 04/11/2017   PSVT (paroxysmal supraventricular tachycardia) 05/27/2016   Peripheral edema 05/27/2016   Dry eye 09/15/2014   Nail abnormality 09/15/2014   Melena 03/18/2014   Heme positive stool 03/18/2014   Essential hypertension, benign 01/22/2014   Mood disorder 09/16/2011   Diabetes mellitus 08/16/2011   Hyperlipidemia 08/16/2011   Osteoarthrosis involving lower leg 11/02/2009    Orientation RESPIRATION BLADDER Height & Weight     Self, Time, Situation, Place  Normal Continent Weight: 140 lb (63.5 kg) Height:   (180.3 cm)  BEHAVIORAL SYMPTOMS/MOOD NEUROLOGICAL BOWEL NUTRITION STATUS      Continent Diet  AMBULATORY  STATUS COMMUNICATION OF NEEDS Skin   Limited Assist Verbally Normal                       Personal Care Assistance Level of Assistance  Bathing, Dressing Bathing Assistance: Limited assistance   Dressing Assistance: Limited assistance     Functional Limitations Info             SPECIAL CARE FACTORS FREQUENCY                       Contractures Contractures Info: Not present    Additional Factors Info  Code Status Code Status Info: Full             Current Medications (04/05/2023):  This is the current hospital active medication list Current Facility-Administered Medications  Medication Dose Route Frequency Provider Last Rate Last Admin   acetaminophen (TYLENOL) tablet 650 mg  650 mg Oral Q6H PRN Oliver Barre, MD   650 mg at 04/05/23 0451   Or   acetaminophen (TYLENOL) suppository 650 mg  650 mg Rectal Q6H PRN Oliver Barre, MD       albuterol (PROVENTIL) (2.5 MG/3ML) 0.083% nebulizer solution 2.5 mg  2.5 mg Nebulization Q6H PRN Oliver Barre, MD       apixaban Everlene Balls) tablet 5 mg  5 mg Oral BID Sherryll Burger, Pratik D, DO   5 mg at 04/05/23 8469   atorvastatin (LIPITOR) tablet 20 mg  20 mg Oral Daily Oliver Barre, MD   20 mg at 04/05/23 0903   diltiazem (CARDIZEM) tablet 60 mg  60 mg Oral  Daily PRN [place] Ouch, [place] M, PA-C       gabapentin (NEURONTIN) capsule 100 mg  100 mg Oral QHS Thane Edu A, MD   100 mg at 04/04/23 2143   insulin aspart (novoLOG) injection 0-15 Units  0-15 Units Subcutaneous TID WC Oliver Barre, MD   2 Units at 04/04/23 1251   insulin aspart (novoLOG) injection 0-5 Units  0-5 Units Subcutaneous QHS Oliver Barre, MD   2 Units at 04/04/23 2143   insulin aspart (novoLOG) injection 3 Units  3 Units Subcutaneous TID WC Shah, Pratik D, DO   3 Units at 04/04/23 1250   insulin glargine-yfgn (SEMGLEE) injection 37 Units  37 Units Subcutaneous QHS Maurilio Lovely D, DO   37 Units at 04/04/23 2147   magnesium sulfate IVPB 2 g 50 mL  2 g  Intravenous Once Strader, [place] M, PA-C       morphine (PF) 2 MG/ML injection 2 mg  2 mg Intravenous Q2H PRN Oliver Barre, MD   2 mg at 04/05/23 0904   ondansetron (ZOFRAN) tablet 4 mg  4 mg Oral Q6H PRN Oliver Barre, MD       Or   ondansetron [facility]) injection 4 mg  4 mg Intravenous Q6H PRN Oliver Barre, MD       Oral care mouth rinse  15 mL Mouth Rinse PRN Sherryll Burger, Pratik D, DO       potassium chloride SA (KLOR-CON M) CR tablet 20 mEq  20 mEq Oral Once Ellsworth Lennox, PA-C         Discharge Medications: Please see discharge summary for a list of discharge medications.  Relevant Imaging Results:  Relevant Lab Results:   Additional Information [id number]  Villa Herb, [place]

## 2023-04-05 NOTE — Inpatient Diabetes Management (Signed)
Inpatient Diabetes Program Recommendations  AACE/ADA: New Consensus Statement on Inpatient Glycemic Control   Target Ranges:  Prepandial:   less than 140 mg/dL      Peak postprandial:   less than 180 mg/dL (1-2 hours)      Critically ill patients:  140 - 180 mg/dL    Review of Glycemic Control  Latest Reference Range & Units 04/04/23 07:34 04/04/23 11:10 04/04/23 16:39 04/04/23 20:56 04/05/23 07:47 04/05/23 08:59  Glucose-Capillary 70 - 99 mg/dL 75 161 (H) 096 (H) 045 (H) 69 (L) 141 (H)   Diabetes history: DM2 Outpatient Diabetes medications: Lantus 40 units QHS, Tradjenta 5 mg daily Current orders for Inpatient glycemic control:  Semglee 37 units QHS Novolog 0-15 units TID with meals Novolog 0-5 units QHS Novolog 3 units tid meal coverage  Inpatient Diabetes Program Recommendations:    Note Fasting glucose hypoglycemia  -  Please consider decreasing Semglee to 34 units QHS   Novolog meal coverage insulin needs to be given more consistently if pt consumes at least 50% of meals and Glucose is at least 80 mg/dl.   Thanks, Christena Deem RN, MSN, BC-ADM Inpatient Diabetes Coordinator Team Pager [phone] (8a-5p)

## 2023-04-05 NOTE — Discharge Summary (Addendum)
Physician Discharge Summary   Patient: Miranda Sampson MRN: [id number] DOB: [birth year]  Admit date:     03/29/2023  Discharge date: 04/05/23  Discharge Physician: Kendell Bane   PCP: Benetta Spar, MD   Recommendations at discharge:   Follow-up with the PCP orthopedic within 1-2 week Follow-up with a cardiologist within 2-4 weeks  Discharge Diagnoses: Principal Problem:   Displaced comminuted fracture of shaft of right femur, initial encounter for closed fracture Active Problems:   Diabetes mellitus   Hyperlipidemia   PSVT (paroxysmal supraventricular tachycardia)   Cocaine abuse   Hypotension   Leukocytosis    Brief Narrative:    Miranda Sampson is a 72 y.o. female with medical history significant of allergies, depression, diabetes mellitus, GERD, hyperlipidemia, substance abuse, and more presents the ED with a chief complaint of fall.  She was admitted for displaced comminuted fracture of right femoral shaft.  She underwent ORIF 4/19 and has been struggling with repeat episodes of SVT and now has new onset atrial fibrillation with RVR.  She continues to have recurrent SVT and cardiology reconsult to assist with management.        Displaced comminuted fracture of shaft of right femur status post ORIF 4/19 -S/p mechanical fall; x-ray showed comminuted displaced distal femoral shaft fracture -Underwent retrograde femoral nail for comminuted distal femur fracture. -Cardiology was consulted for preoperative cardiac clearance and SVT likely in setting of cocaine use -She was cleared by cardiology for surgery  -Orthopedics recommended to start aspirin 81 mg twice daily for 6 weeks for DVT prophylaxis, but will now be on Eliquis as below, follow-up with orthopedics outpatient in 2 weeks   SVT recurrent/new onset atrial fibrillation with RVR -Known history of SVT and required adenosine 4/17, likely triggered by cocaine use  -Cardiology was consulted, not started on  beta-blocker due to recent cocaine use, but okay to use Coreg -Recommend to keep potassium more than 4.0, magnesium more than 2.0 -TSH 1.596 -Coreg held for now given some hypotension, may require initiation of amiodarone per cardiology 4/23 -Started on Eliquis for anticoagulation given CHA2DS2-VASc score of at least 3   Hypomagnesemia -was repleted  Hypertension -Due to mild hypertension Coreg was held, now can be resumed   Cocaine abuse - Counseled on importance of cessation - UDS ordered and pending - TOC consult   Hyperlipidemia - Continue statin   Diabetes mellitus - Continue SSI -Semglee 37 units at bedtime with 3 units NovoLog 3 times daily -Hemoglobin A1c 8.5% 10/2022      DVT prophylaxis: Eliquis Code Status: Full Family Communication: None at bedside  Consultants:  Orthopedics Cardiology   Procedures:  ORIF 4/19        Disposition: Nursing home Diet recommendation:  Discharge Diet Orders (From admission, onward)     Start     Ordered   04/05/23 0000  Diet - low sodium heart healthy        04/05/23 1016           Cardiac diet DISCHARGE MEDICATION: Allergies as of 04/05/2023   Not on File      Medication List     STOP taking these medications    ibuprofen 200 MG tablet Commonly known as: ADVIL   Lantus SoloStar 100 UNIT/ML Solostar Pen Generic drug: insulin glargine Replaced by: insulin glargine-yfgn 100 UNIT/ML injection       TAKE these medications    acetaminophen 325 MG tablet Commonly known as: TYLENOL Take 2 tablets (650  mg total) by mouth every 6 (six) hours as needed for mild pain (or Fever >/= 101).   albuterol 108 (90 Base) MCG/ACT inhaler Commonly known as: VENTOLIN HFA Inhale 2 puffs into the lungs every 6 (six) hours as needed for wheezing or shortness of breath.   apixaban 5 MG Tabs tablet Commonly known as: ELIQUIS Take 1 tablet (5 mg total) by mouth 2 (two) times daily.   atorvastatin 20 MG  tablet Commonly known as: LIPITOR Take 20 mg by mouth daily.   diltiazem 60 MG tablet Commonly known as: CARDIZEM Take 1 tablet (60 mg total) by mouth daily as needed (palpitations).   gabapentin 100 MG capsule Commonly known as: NEURONTIN Take 100 mg by mouth at bedtime.   glucose blood test strip Commonly known as: Accu-Chek Aviva Use as instructed to monitor FSBS 2x daily. Dx: E11.9   insulin aspart 100 UNIT/ML injection Commonly known as: novoLOG Inject 3 Units into the skin 3 (three) times daily with meals.   insulin glargine-yfgn 100 UNIT/ML injection Commonly known as: SEMGLEE Inject 0.37 mLs (37 Units total) into the skin at bedtime. Replaces: Lantus SoloStar 100 UNIT/ML Solostar Pen   Tradjenta 5 MG Tabs tablet Generic drug: linagliptin Take 5 mg by mouth daily.               Discharge Care Instructions  (From admission, onward)           Start     Ordered   04/05/23 0000  Discharge wound care:       Comments: Per RN instructions   04/05/23 1016            Contact information for follow-up providers     Ellsworth Lennox, PA-C Follow up on 04/26/2023.   Specialties: Cardiology, Cardiology Why: Cardiology Hospital Follow-up on 04/26/2023 at 2:30 PM. Contact information: [address] [place] [address] [phone]              Contact information for after-discharge care     Destination     [facility] Preferred SNF .   Service: Skilled Nursing Contact information: [address] [place] [address] [phone]                    Discharge Exam: Ceasar Mons Weights   03/29/23 0219  Weight: 63.5 kg        General:  AAO x 3,  cooperative, no distress;   HEENT:  Normocephalic, PERRL, otherwise with in Normal limits   Neuro:  CNII-XII intact. , normal motor and sensation, reflexes intact   Lungs:   Clear to auscultation BL, Respirations unlabored,  No  wheezes / crackles  Cardio:    S1/S2, RRR, No murmure, No Rubs or Gallops   Abdomen:  Soft, non-tender, bowel sounds active all four quadrants, no guarding or peritoneal signs.  Muscular  skeletal:  Limited exam -global generalized weaknesses - in bed, able to move all 4 extremities,   2+ pulses,  symmetric, No pitting edema  Skin:  Dry, warm to touch, negative for any Rashes,  Wounds: Please see nursing documentation  Pressure Injury 04/01/23 Sacrum Mid Stage 2 -  Partial thickness loss of dermis presenting as a shallow open injury with a red, pink wound bed without slough. (Active)  04/01/23 1714  Location: Sacrum  Location Orientation: Mid  Staging: Stage 2 -  Partial thickness loss of dermis presenting as a shallow open injury with  a red, pink wound bed without slough.  Wound Description (Comments):   Present on Admission: No  Dressing Type Foam - Lift dressing to assess site every shift 04/04/23 2059          Condition at discharge: fair  The results of significant diagnostics from this hospitalization (including imaging, microbiology, ancillary and laboratory) are listed below for reference.   Imaging Studies: CT HEAD WO CONTRAST ( )  Result Date: 04/02/2023 CLINICAL DATA:  Altered mental status EXAM: CT HEAD WITHOUT CONTRAST TECHNIQUE: Contiguous axial images were obtained from the base of the skull through the vertex without intravenous contrast. RADIATION DOSE REDUCTION: This exam was performed according to the departmental dose-optimization program which includes automated exposure control, adjustment of the mA and/or kV according to patient size and/or use of iterative reconstruction technique. COMPARISON:  CT head 01/13/2023 FINDINGS: Brain: There is no acute intracranial hemorrhage, extra-axial fluid collection, or acute infarct. Parenchymal volume loss with prominence of the ventricular system and extra-axial CSF spaces is unchanged. The ventricles are stable in size.  Patchy hypodensity in the supratentorial white matter is unchanged, again likely reflecting sequela of underlying chronic small-vessel ischemic change. 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 The pituitary and suprasellar region are normal. There is no mass lesion. There is no mass effect or midline shift. Vascular: There is calcification of the bilateral carotid siphons and vertebral arteries. Skull: Normal. Negative for fracture or focal lesion. Sinuses/Orbits: The imaged paranasal sinuses are clear. The globes and orbits are unremarkable. Other: None. IMPRESSION: Stable noncontrast head CT with no acute intracranial pathology. Electronically Signed   By: Lesia Hausen M.D.   On: 04/02/2023 13:33   DG FEMUR, MIN 2 VIEWS RIGHT  Result Date: 03/31/2023 CLINICAL DATA:  ORIF distal femur fracture EXAM: RIGHT FEMUR 2 VIEWS COMPARISON:  03/29/2023 femur radiographs FINDINGS: Nine fluoroscopic images are obtained during the performance of the procedure and are provided for interpretation only. Images demonstrate intramedullary nail fixation of a distal femoral fracture. Improved alignment of the major fracture fragments. No new fracture is seen. Fluoroscopy time: 1 minute 33 seconds Cumulative air kerma: 13.8 mGy IMPRESSION: Fluoroscopy provided for intramedullary nail fixation of a distal femoral fracture. Electronically Signed   By: Wiliam Ke M.D.   On: 03/31/2023 15:57   DG FEMUR PORT, MIN 2 VIEWS RIGHT  Result Date: 03/31/2023 CLINICAL DATA:  Closed fracture of right distal femur. EXAM: RIGHT FEMUR PORTABLE 2 VIEW COMPARISON:  Right femur radiographs 03/29/2023 FINDINGS: Sequelae of interval ORIF of the highly comminuted distal femur fracture identified. An intramedullary nail and proximal and distal interlocking screws have been placed. Alignment is greatly improved from the preoperative images with the main proximal and main distal fragments now appearing near  anatomic in alignment relative to one another. Multiple displaced comminuted fractures are most notable along the lateral aspect of the distal femoral shaft. The knee and hip are located. 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 IMPRESSION: Interval ORIF of distal femur fracture. Electronically Signed   By: Sebastian Ache M.D.   On: 03/31/2023 15:52   DG C-Arm 1-60 Min-No Report  Result Date: 03/31/2023 Fluoroscopy was utilized by the requesting physician.  No radiographic interpretation.   DG Chest Port 1 View  Result Date: 03/29/2023 CLINICAL DATA:  Preop EXAM: PORTABLE CHEST 1 VIEW COMPARISON:  CT chest dated 03/13/2023 FINDINGS: Increased interstitial markings in the lungs bilaterally, lower lobe predominant, reflecting subpleural reticulation/fibrosis when correlating with prior CT. No superimposed  opacity suspicious for pneumonia. No pleural effusion or pneumothorax. The heart is normal in size.  Thoracic aortic atherosclerosis. IMPRESSION: Chronic interstitial lung disease/fibrosis. No acute cardiopulmonary disease. Electronically Signed   By: Charline Bills M.D.   On: 03/29/2023 03:02   DG Femur Portable Min 2 Views Right  Result Date: 03/29/2023 CLINICAL DATA:  Fall, right femur deformity EXAM: RIGHT FEMUR PORTABLE 2 VIEW COMPARISON:  None Available. FINDINGS: Right hip and visualized bony pelvis are intact. Comminuted distal femoral shaft fracture. Knee is displaced 1 full shaft width medially relative to the distal femoral shaft. Mild apex posterior angulation. Associated soft tissue swelling with obvious deformity. IMPRESSION: Comminuted, displaced distal femoral shaft fracture, as described above. Electronically Signed   By: Charline Bills M.D.   On: 03/29/2023 03:01   CT Chest High Resolution  Result Date: 03/14/2023 CLINICAL DATA:  72 year old female with history of interstitial lung disease. Follow-up study. EXAM: CT CHEST WITHOUT CONTRAST TECHNIQUE: Multidetector CT imaging of  the chest was performed following the standard protocol without intravenous contrast. High resolution imaging of the lungs, as well as inspiratory and expiratory imaging, was performed. RADIATION DOSE REDUCTION: This exam was performed according to the departmental dose-optimization program which includes automated exposure control, adjustment of the mA and/or kV according to patient size and/or use of iterative reconstruction technique. COMPARISON:  Chest CT 03/23/2022. FINDINGS: Cardiovascular: Heart size is normal. There is no significant pericardial fluid, thickening or pericardial calcification. There is aortic atherosclerosis, as well as atherosclerosis of the great vessels of the mediastinum and the coronary arteries, including calcified atherosclerotic plaque in the left anterior descending and right coronary arteries. Severe calcifications of the mitral annulus. Mediastinum/Nodes: Multiple prominent borderline enlarged mediastinal and bilateral hilar lymph nodes, similar to the prior study, likely chronic and reactive in the setting of interstitial lung disease. Esophagus is unremarkable in appearance. No axillary lymphadenopathy. Lungs/Pleura: High-resolution images again demonstrate widespread areas of ground-glass attenuation, septal thickening, subpleural reticulation, traction bronchiectasis, peripheral bronchiolectasis, thickening of the peribronchovascular interstitium and scattered areas of honeycombing. Findings are most evident throughout the mid lungs with no discernible craniocaudal gradient and some relative sparing in portions of the lung bases. No significant progression of disease compared to the prior study. Inspiratory and expiratory imaging demonstrates some mild air trapping, predominantly in the lung bases. Upper Abdomen: Aortic atherosclerosis. Musculoskeletal: There are no aggressive appearing lytic or blastic lesions noted in the visualized portions of the skeleton. IMPRESSION: 1.  Unusual appearance of the lungs which demonstrate advanced fibrosis, with no significant progression of disease compared to the prior study, no definitive craniocaudal gradient, and some air trapping. Overall, findings are favored to reflect an alternative diagnosis (not usual interstitial pneumonia) per current ATS guidelines, potentially chronic hypersensitivity pneumonitis (although an atypical distribution of UIP is not entirely excluded). Repeat high-resolution chest CT is recommended in 12 months to assess for temporal changes in the appearance of the lung parenchyma. 2. Aortic atherosclerosis, in addition to two-vessel coronary artery disease. Assessment for potential risk factor modification, dietary therapy or pharmacologic therapy may be warranted, if clinically indicated. 3. There are calcifications of the mitral annulus. Echocardiographic correlation for evaluation of potential valvular dysfunction may be warranted if clinically indicated. Aortic Atherosclerosis (ICD10-I70.0). Electronically Signed   By: Trudie Reed M.D.   On: 03/14/2023 11:20    Microbiology: Results for orders placed or performed during the hospital encounter of 03/29/23  Surgical PCR screen     Status: None   Collection Time: 03/29/23  9:19 AM   Specimen: Nasal Mucosa; Nasal Swab  Result Value Ref Range Status   MRSA, PCR NEGATIVE NEGATIVE Final   Staphylococcus aureus NEGATIVE NEGATIVE Final    Comment: (NOTE) The Xpert SA Assay (FDA approved for NASAL specimens in patients 78 years of age and older), is one component of a comprehensive surveillance program. It is not intended to diagnose infection nor to guide or monitor treatment. Performed at [facility], [address] [place], [place] [address]     Labs: CBC: Recent Labs  Lab 03/31/23 [phone] 04/01/23 0439 04/03/23 0624 04/04/23 0430 04/05/23 0419  WBC 8.4 10.4 9.8 8.5 8.0  HGB 10.4* 9.4* 10.0* 9.2* 9.7*  HCT 31.7* 28.7* 30.3* 28.6* 30.2*  MCV  101.9* 101.1* 101.3* 102.1* 102.0*  PLT 228 229 291 301 347   Basic Metabolic Panel: Recent Labs  Lab 03/30/23 0409 03/31/23 0513 04/01/23 0439 04/03/23 0524 04/03/23 0624 04/04/23 0430 04/05/23 0419  NA 134* 133* 133*  --  136 135 135  K 3.7 4.1 4.6  --  3.6 3.8 3.8  CL 99 100 100  --  102 102 102  CO2 --  GLUCOSE 51* 83 411*  --  79 110* 94  BUN 26* 19 20  --  CREATININE 0.62 0.51 0.69  --  0.39* 0.44 0.41*  CALCIUM 8.6* 8.2* 8.1*  --  8.7* 8.4* 8.2*  MG 1.7 1.7  --  1.3*  --  1.6* 1.7   Liver Function Tests: Recent Labs  Lab 04/01/23 0439  AST 13*  ALT 11  ALKPHOS 58  BILITOT 1.0  PROT 6.2*  ALBUMIN 2.3*   CBG: Recent Labs  Lab 04/04/23 1639 04/04/23 2056 04/05/23 0747 04/05/23 0859 04/05/23 1157  GLUCAP 103* 242* 69* 141* 165*    Discharge time spent: greater than 45 minutes.  Signed: Kendell Bane, MD [facility] 04/05/2023

## 2023-04-05 NOTE — Progress Notes (Signed)
  Spoke with Field seismologist at [facility] and report given.     Miranda Sampson

## 2023-04-05 NOTE — Care Management Important Message (Signed)
Important Message  Patient Details  Name: Miranda Sampson MRN: [id number] Date of Birth: 1951-12-08   Medicare Important Message Given:  Yes     Corey Harold 04/05/2023, 11:45 AM

## 2023-04-05 NOTE — Progress Notes (Signed)
Mobility Specialist Progress Note:   04/05/23 1124  Mobility  Activity Transferred from chair to bed  Level of Assistance +2 (takes two people)  Press photographer wheel walker  Distance Ambulated (ft) 4 ft  RLE Weight Bearing WBAT  Activity Response Tolerated well  Mobility Referral Yes  $Mobility charge 1 Mobility   Pt requested to transfer C>B. Required MaxA +2 via RW to transfer. Tolerated well, c/o hip pain during session. Left pt in chair with nursing staff, all needs met, eager for d/c.  Feliciana Rossetti Mobility Specialist Please contact via SecureChat or  Rehab office at [phone]

## 2023-04-05 NOTE — Evaluation (Signed)
Occupational Therapy Evaluation Patient Details Name: Miranda Sampson MRN: [id number] DOB: [birth year] Today's Date: 04/05/2023   History of Present Illness Miranda Sampson is a 72 y.o. female with medical history significant of allergies, depression, diabetes mellitus, GERD, hyperlipidemia, substance abuse, and more presents the ED with a chief complaint of fall.  Patient reports that she was walking up the stairs when she missed 1 and fell backwards.  She sustained a Rt femur fx and had an ORIF on 4/19.  She is WBAT   Clinical Impression   Pt agreeable to OT evaluation. Pt reports living alone with PRN support from daughter. Independent at baseline other than assist for groceries. Today pt required mod to max A for sit to stand and mod for pivot to chair. Min A for bed mobility due to difficulty moving R LE to EOB. Pt is generally weak and limited by pain and weakness. Pt left in chair with call bell within reach. Pt will benefit from continued OT in the hospital and recommended venue below to increase strength, balance, and endurance for safe ADL's.         Recommendations for follow up therapy are one component of a multi-disciplinary discharge planning process, led by the attending physician.  Recommendations may be updated based on patient status, additional functional criteria and insurance authorization.   Assistance Recommended at Discharge Intermittent Supervision/Assistance  Patient can return home with the following A lot of help with walking and/or transfers;A lot of help with bathing/dressing/bathroom;Assistance with cooking/housework;Assist for transportation;Help with stairs or ramp for entrance    Functional Status Assessment  Patient has had a recent decline in their functional status and demonstrates the ability to make significant improvements in function in a reasonable and predictable amount of time.  Equipment Recommendations  None recommended by OT    Recommendations for Other  Services       Precautions / Restrictions Precautions Precautions: Fall Restrictions Weight Bearing Restrictions: Yes RLE Weight Bearing: Weight bearing as tolerated      Mobility Bed Mobility Overal bed mobility: Needs Assistance Bed Mobility: Supine to Sit     Supine to sit: Min assist, HOB elevated     General bed mobility comments: Assit to stabilize R LE during bed mobility.    Transfers Overall transfer level: Needs assistance Equipment used: Rolling walker (2 wheels) Transfers: Sit to/from Stand, Bed to chair/wheelchair/BSC Sit to Stand: Mod assist, Max assist     Step pivot transfers: Mod assist     General transfer comment: Much assist to boost from EOB. More mod A to step to chair from EOB. Extedned time and labored movement.      Balance Overall balance assessment: Needs assistance Sitting-balance support: Feet supported, No upper extremity supported Sitting balance-Leahy Scale: Good Sitting balance - Comments: seated EOB   Standing balance support: During functional activity, Reliant on assistive device for balance, Bilateral upper extremity supported Standing balance-Leahy Scale: Poor Standing balance comment: using RW poor to fair                           ADL either performed or assessed with clinical judgement   ADL Overall ADL's : Needs assistance/impaired     Grooming: Set up;Sitting   Upper Body Bathing: Set up;Sitting   Lower Body Bathing: Moderate assistance;Maximal assistance;Sitting/lateral leans       Lower Body Dressing: Moderate assistance;Maximal assistance;Sitting/lateral leans   Toilet Transfer: Moderate assistance;Maximal assistance;Rolling walker (2 wheels);Stand-pivot  Toileting- Clothing Manipulation and Hygiene: Maximal assistance;Bed level       Functional mobility during ADLs: Moderate assistance;Maximal assistance;Rolling walker (2 wheels)       Vision Baseline Vision/History:  (Reports "eyes are  going bad") Ability to See in Adequate Light: 1 Impaired Patient Visual Report: No change from baseline Vision Assessment?: No apparent visual deficits Additional Comments: outside of baseline                Pertinent Vitals/Pain Pain Assessment Pain Assessment: 0-10 Pain Score: 8  Pain Descriptors / Indicators: Grimacing, Moaning Pain Intervention(s): Limited activity within patient's tolerance, Monitored during session, Repositioned     Hand Dominance Right   Extremity/Trunk Assessment Upper Extremity Assessment Upper Extremity Assessment: Generalized weakness   Lower Extremity Assessment Lower Extremity Assessment: Defer to PT evaluation   Cervical / Trunk Assessment Cervical / Trunk Assessment: Normal   Communication Communication Communication: No difficulties   Cognition Arousal/Alertness: Awake/alert Behavior During Therapy: WFL for tasks assessed/performed Overall Cognitive Status: Within Functional Limits for tasks assessed                                                        Home Living Family/patient expects to be discharged to:: Skilled nursing facility Living Arrangements: Alone Available Help at Discharge: Family;Available PRN/intermittently Type of Home: Apartment Home Access: Stairs to enter Entrance Stairs-Number of Steps: 2 Entrance Stairs-Rails: None Home Layout: One level     Bathroom Shower/Tub: Chief Strategy Officer: Standard Bathroom Accessibility: Yes How Accessible: Accessible via walker Home Equipment: Cane - single point          Prior Functioning/Environment Prior Level of Function : Independent/Modified Independent             Mobility Comments: Household ambulator with SPC, does not drives, daughter grocery shops ADLs Comments: Independent        OT Problem List: Decreased strength;Decreased activity tolerance;Impaired balance (sitting and/or standing);Decreased range of  motion;Pain      OT Treatment/Interventions: Self-care/ADL training;Therapeutic exercise;Therapeutic activities;Patient/family education;Balance training;DME and/or AE instruction    OT Goals(Current goals can be found in the care plan section) Acute Rehab OT Goals Patient Stated Goal: Go to rehab. OT Goal Formulation: With patient Time For Goal Achievement: 04/19/23 Potential to Achieve Goals: Good  OT Frequency: Min 2X/week                                   End of Session Equipment Utilized During Treatment: Rolling walker (2 wheels);Gait belt Nurse Communication: Mobility status  Activity Tolerance: Patient tolerated treatment well Patient left: in chair;with call bell/phone within reach  OT Visit Diagnosis: Unsteadiness on feet (R26.81);Other abnormalities of gait and mobility (R26.89);History of falling (Z91.81);Muscle weakness (generalized) (M62.81)                Time: 1610-9604 OT Time Calculation (min): 23 min Charges:  OT General Charges $OT Visit: 1 Visit OT Evaluation $OT Eval Low Complexity: 1 Low  Buffy Ehler OT, MOT  Danie Chandler 04/05/2023, 9:41 AM

## 2023-04-26 ENCOUNTER — Telehealth: Payer: Self-pay | Admitting: Orthopedic Surgery

## 2023-04-26 ENCOUNTER — Ambulatory Visit: Payer: 59 | Admitting: Student

## 2023-04-26 NOTE — Telephone Encounter (Signed)
Called the patient multiple times, phone just rang and rang, no voicemail either.  I called the patient's daughter Miranda Sampson and lvm for her to call me so that we can get her mom scheduled for a postop appointment.

## 2023-07-12 ENCOUNTER — Ambulatory Visit: Payer: 59 | Attending: Student | Admitting: Student

## 2023-07-12 NOTE — Progress Notes (Deleted)
Cardiology Office Note    Date:  07/12/2023  ID:  Miranda Sampson, DOB 30-Jan-1951, MRN [id number] Cardiologist: Dina Rich, MD    History of Present Illness:    Miranda Sampson is a 72 y.o. 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  She was most recently admitted to [facility] in 03/2023 after suffering a mechanical fall and found to have a commuted, displaced distal femoral shaft fracture requiring intramedullary femoral nailing.  Cardiology was asked to see the patient during admission as she had frequent episodes of SVT which would resolve with adenosine. Echocardiogram showed a preserved EF of 60 to 65% with mild mitral stenosis and mild to moderate TR. She did have a severely thickened, speckled like appearing myocardium and was recommended consider a cardiac MRI for better evaluation and to rule out cardiac amyloidosis. She did report using cocaine the evening prior to admission and it was felt like this was the likely culprit for her SVT. She was provided with an Rx for short acting Cardizem 60 mg to take as needed and it was recommended to consider EP referral as an outpatient for her SVT.  - cMRI? - EP?  ROS: ***  Studies Reviewed:   EKG: EKG is*** ordered today and demonstrates ***   EKG Interpretation Date/Time:    Ventricular Rate:    PR Interval:    QRS Duration:    QT Interval:    QTC Calculation:   R Axis:      Text Interpretation:         Echocardiogram: 01/2023 IMPRESSIONS     1. Severely thickened speckled like appearing myocardium, thickened heart  valves, diastolic dysfunction. Consider possible cardiac amyloidosis,  consider cMRI for better evaluation. . Left ventricular ejection fraction,  by estimation, is 60 to 65%. The  left ventricle has normal  function. The left ventricle has no regional  wall motion abnormalities. There is moderate left ventricular hypertrophy.  Left ventricular diastolic parameters are consistent with Grade II  diastolic dysfunction  (pseudonormalization). Elevated left atrial pressure.   2. Right ventricular systolic function is normal. The right ventricular  size is normal. There is mildly elevated pulmonary artery systolic  pressure.   3. Left atrial size was mildly dilated.   4. Right atrial size was mildly dilated.   5. The mitral valve is abnormal. No evidence of mitral valve  regurgitation. Mild mitral stenosis. Moderate mitral annular  calcification.   6. The tricuspid valve is abnormal. Tricuspid valve regurgitation is mild  to moderate.   7. The aortic valve is tricuspid. There is mild calcification of the  aortic valve. There is mild thickening of the aortic valve. Aortic valve  regurgitation is not visualized. No aortic stenosis is present.   8. The inferior vena cava is dilated in size with >50% respiratory  variability, suggesting right atrial pressure of 8 mmHg.    Risk Assessment/Calculations:   {Does this patient have ATRIAL FIBRILLATION?:([phone] [id number]} No BP recorded.  {Refresh Note OR Click here to enter BP  :1}***         Physical Exam:   VS:  There were no vitals taken for this visit.   Wt Readings from Last 3 Encounters:  03/29/23 140 lb (63.5 kg)  12/30/22 146 lb (66.2 kg)  03/22/22 130  lb (59 kg)     GEN: Well nourished, well developed in no acute distress NECK: No JVD; No carotid bruits CARDIAC: ***RRR, no murmurs, rubs, gallops RESPIRATORY:  Clear to auscultation without rales, wheezing or rhonchi  ABDOMEN: Appears non-distended. No obvious abdominal masses. EXTREMITIES: No clubbing or cyanosis. No edema.  Distal pedal pulses are 2+ bilaterally.   Assessment and Plan:      {Are you ordering a CV Procedure (e.g. stress test, cath, DCCV, TEE, etc)?   Press F2         :[id number]}   Signed, Ellsworth Lennox, PA-C

## 2023-07-13 ENCOUNTER — Encounter: Payer: Self-pay | Admitting: Student

## 2023-11-12 DEATH — deceased

## 2024-02-11 IMAGING — CT CT CHEST-ABD-PELV W/O CM
2 of 5 series · 15 of 36 positions shown, 17 images · non-contrast
Comparison: November 10, 2008

CLINICAL DATA: Status post fall.



[Series 4: thins · axial · 0.65mm/px · z∈[-649,-124]mm · 12 of 735 slices shown, 14 images]
[im 39/735  mediastinal]
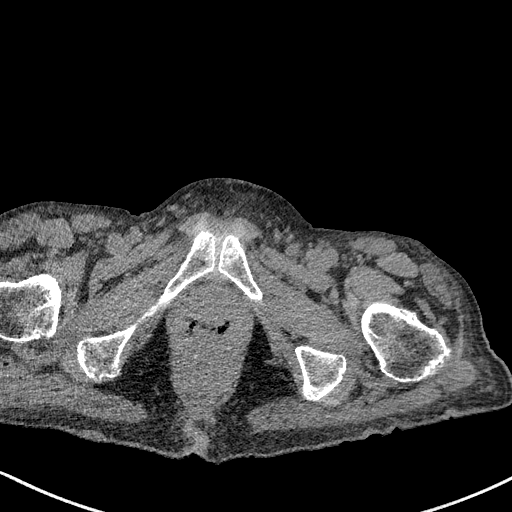
[im 39/735  bone]
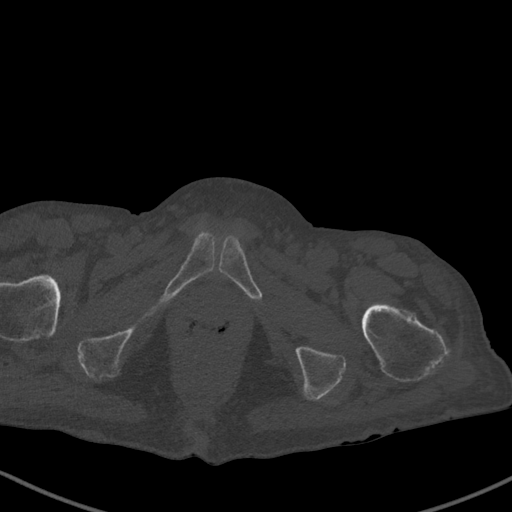
[im 116/735  mediastinal]
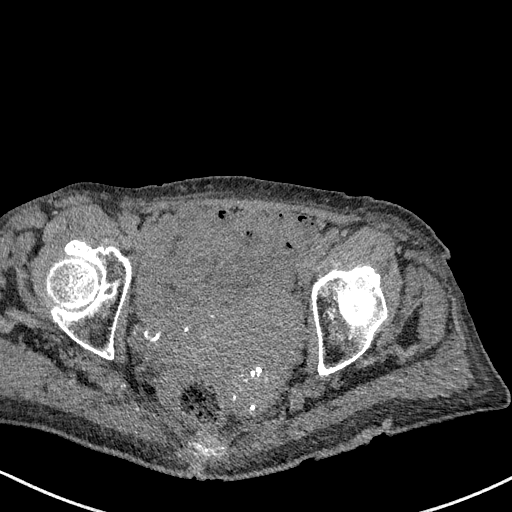
[im 155/735  mediastinal]
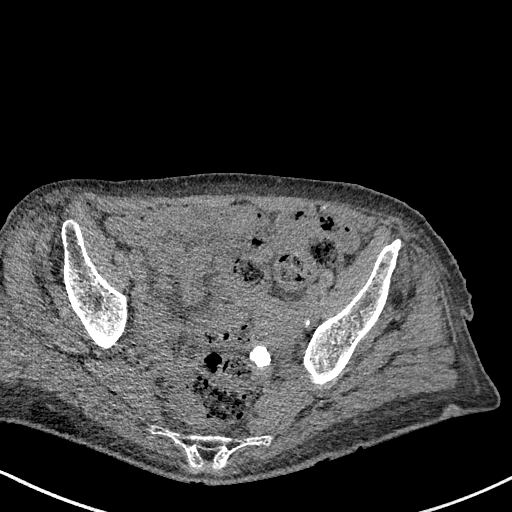
[im 232/735  mediastinal]
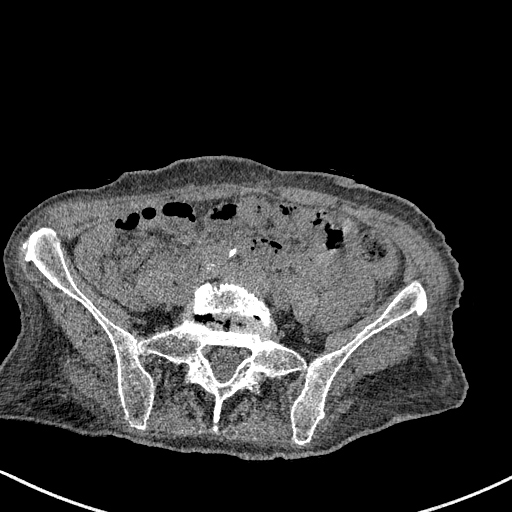
[im 271/735  mediastinal]
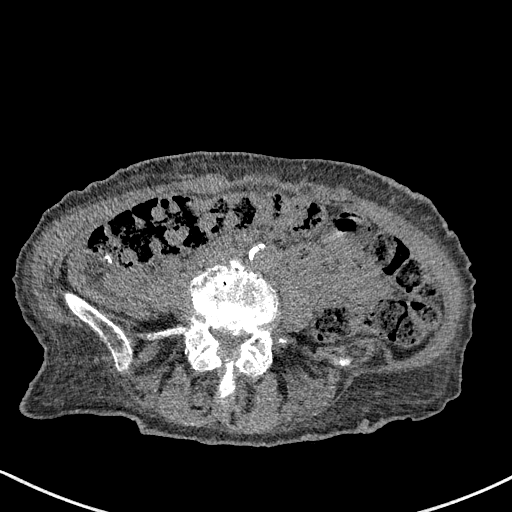
[im 348/735  mediastinal]
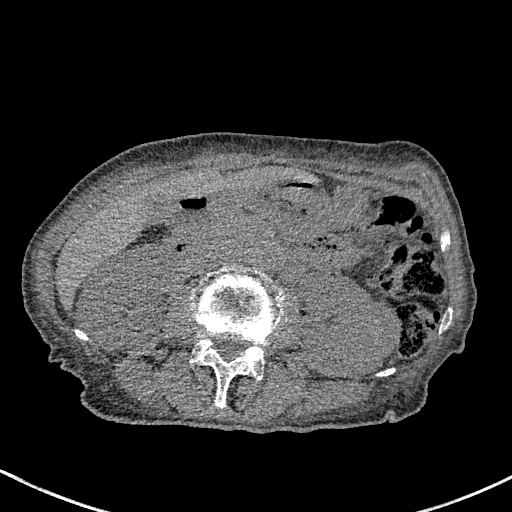
[im 387/735  mediastinal]
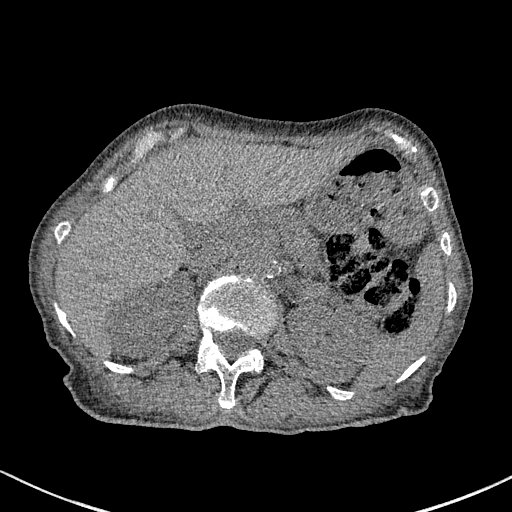
[im 464/735  mediastinal]
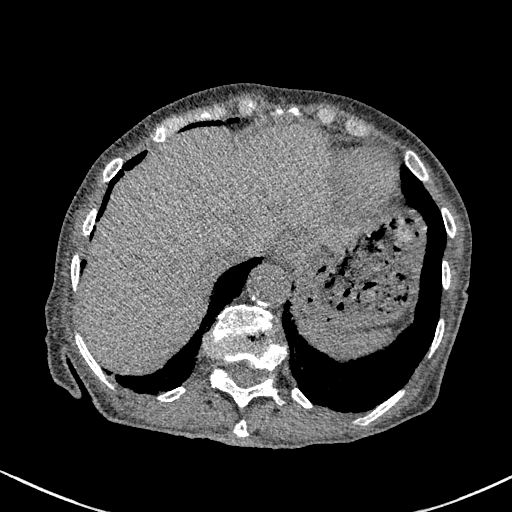
[im 503/735  mediastinal]
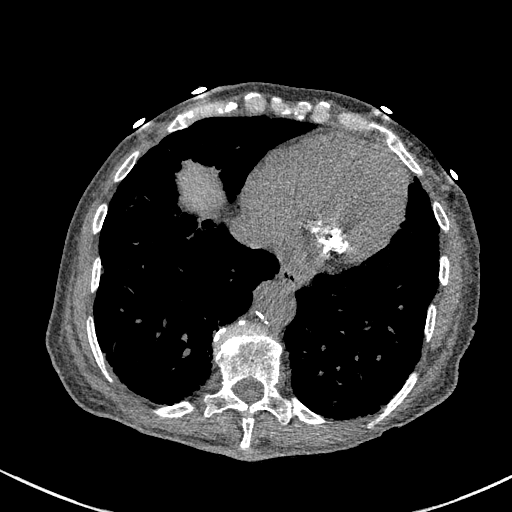
[im 503/735  bone]
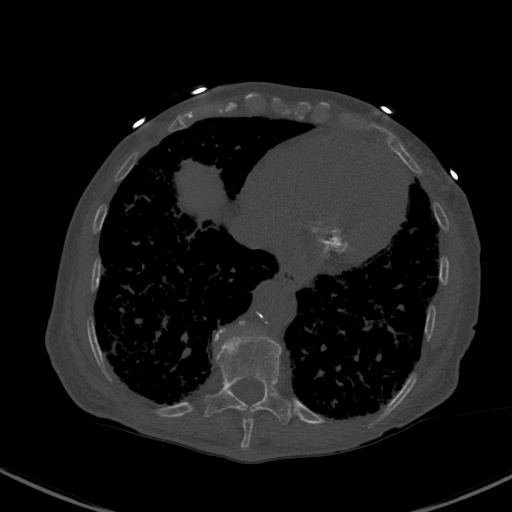
[im 580/735  mediastinal]
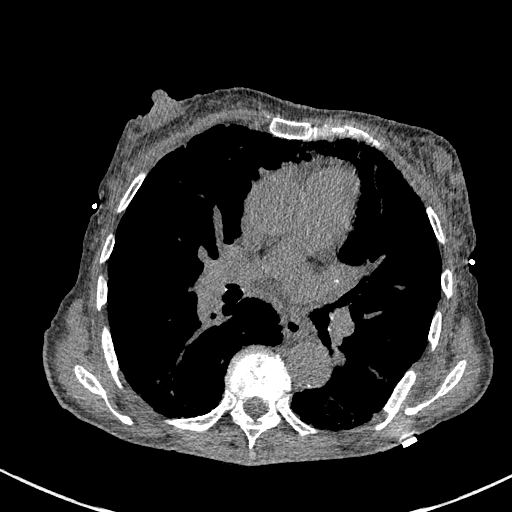
[im 619/735  mediastinal]
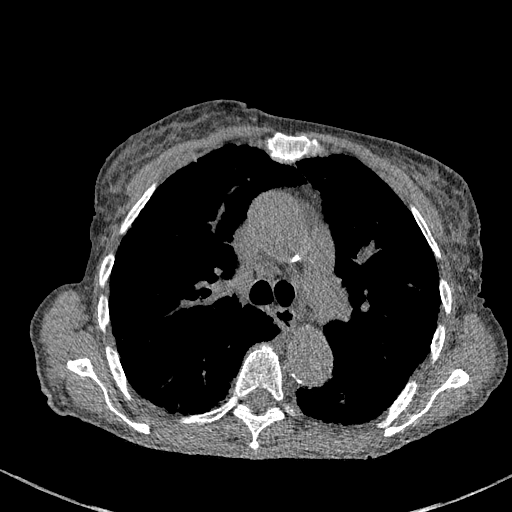
[im 696/735  mediastinal]
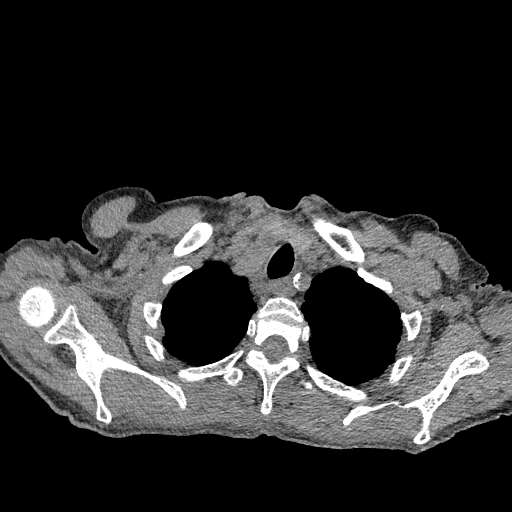

[Series 5: coronal · coronal · 0.83mm/px · 3 of 129 slices shown]
[im 26/129  mediastinal]
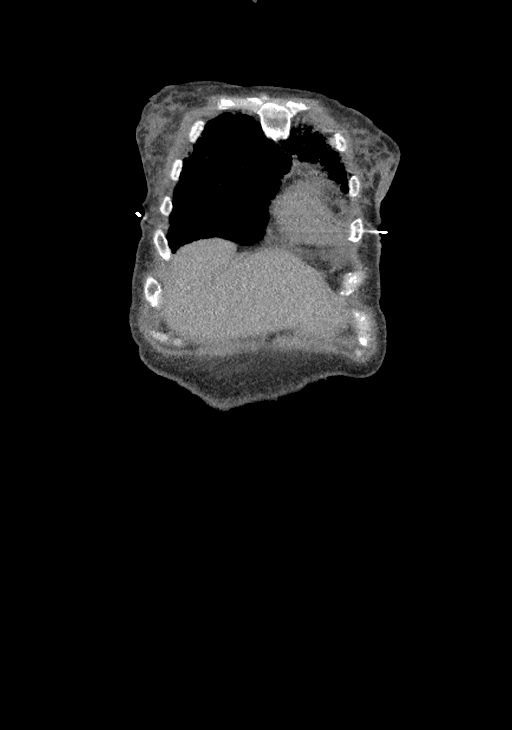
[im 52/129  mediastinal]
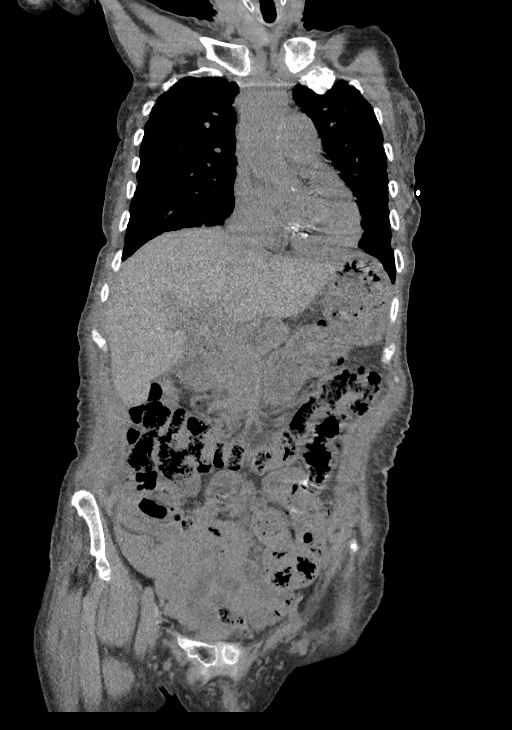
[im 77/129  mediastinal]
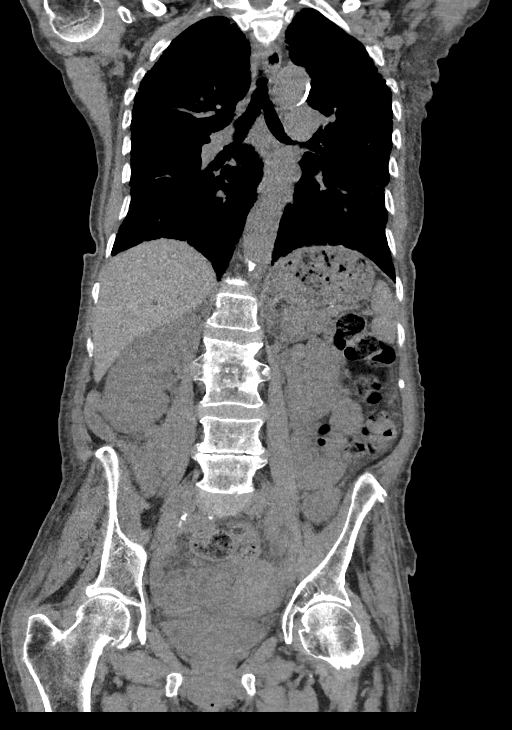

[15 of 36 positions shown; findings below may reference images not displayed]

FINDINGS: CT CHEST FINDINGS

Cardiovascular: There is moderate severity calcification of the
aortic arch without evidence of aortic aneurysm. Normal heart size.
No pericardial effusion.

Mediastinum/Nodes: Mild AP window and pretracheal lymphadenopathy is
seen. Thyroid gland, trachea, and esophagus demonstrate no
significant findings.

Lungs/Pleura: Moderate severity, diffuse bilateral fibrotic changes
are seen.

There is no evidence of focal consolidation, pleural effusion or
pneumothorax.

Musculoskeletal: No chest wall mass or suspicious bone lesions
identified.

CT ABDOMEN PELVIS FINDINGS

Hepatobiliary: No focal liver abnormality is seen. No gallstones,
gallbladder wall thickening, or biliary dilatation.

Pancreas: Unremarkable. No pancreatic ductal dilatation or
surrounding inflammatory changes.

Spleen: Normal in size without focal abnormality.

Adrenals/Urinary Tract: Adrenal glands are unremarkable. Kidneys are
normal, without obstructing renal calculi, focal lesion, or
hydronephrosis. A 5 mm nonobstructing renal calculus is seen within
the lower pole of the right kidney. The urinary bladder is poorly
distended and subsequently limited in evaluation.

Stomach/Bowel: Stomach is within normal limits. Surgically
anastomosed bowel is seen along the hepatic flexure. No evidence of
bowel wall thickening, distention, or inflammatory changes.

Vascular/Lymphatic: Aortic atherosclerosis. No enlarged abdominal or
pelvic lymph nodes.

Reproductive: Multiple calcified and noncalcified heterogeneous
uterine fibroids are seen.

Other: No abdominal wall hernia or abnormality. No abdominopelvic
ascites.

Musculoskeletal: Marked severity degenerative changes are seen at
the levels of L2-L3 and L4-L5.
IMPRESSION: 1. Moderate severity pulmonary fibrosis
2. 5 mm nonobstructing right renal calculus.
3. Multiple calcified and noncalcified heterogeneous uterine
fibroids.
4. Marked severity degenerative changes at the levels of L2-L3 and
L4-L5.
5. Aortic atherosclerosis.

Aortic Atherosclerosis ([id number]).

## 2024-02-11 IMAGING — CT CT HEAD W/O CM
3 of 4 series · 13 of 47 positions shown, 15 images · non-contrast
Comparison: CT brain and cervical spine 06/08/2012

CLINICAL DATA: Hit back of head



[Series 2: head w o · axial · 0.42mm/px · z∈[+5,+125]mm · 7 of 33 slices shown, 9 images]
[im 5/33  brain]
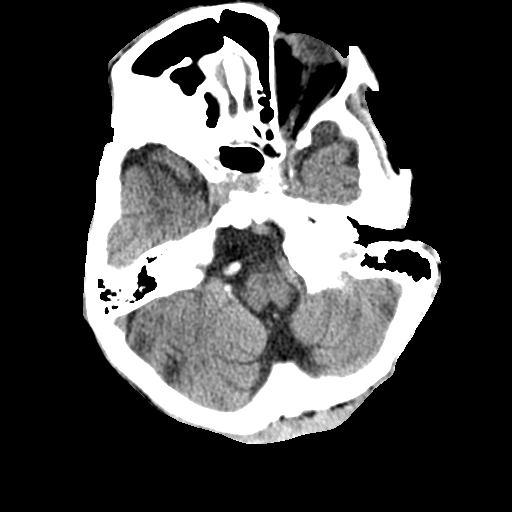
[im 5/33  bone]
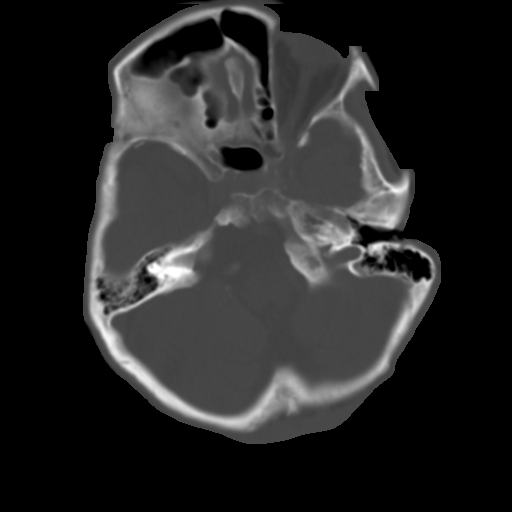
[im 9/33  brain]
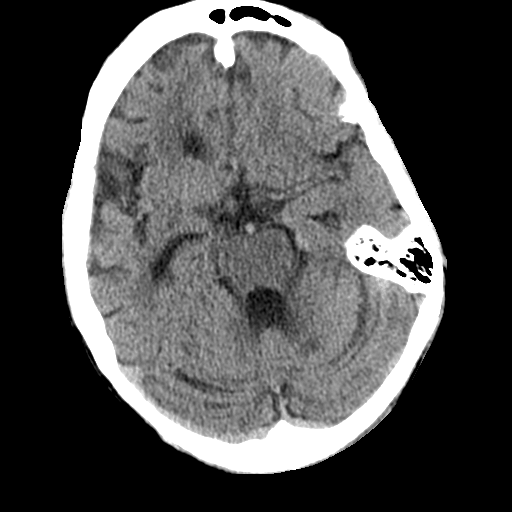
[im 13/33  brain]
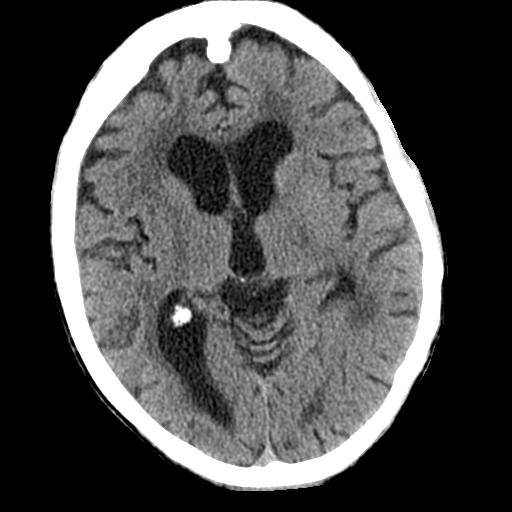
[im 17/33  brain]
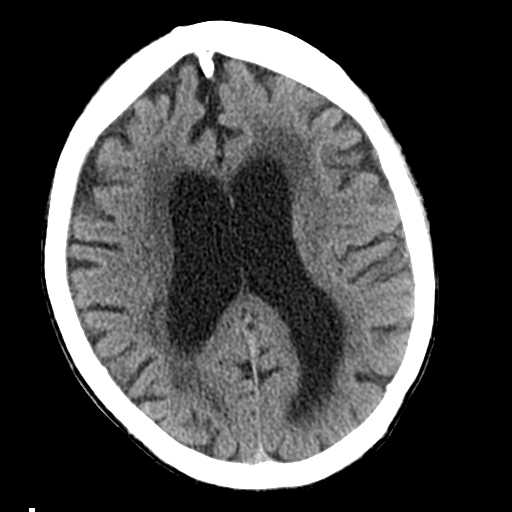
[im 21/33  brain]
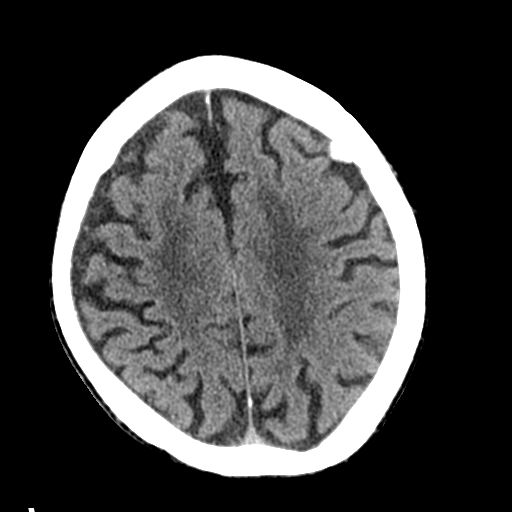
[im 21/33  bone]
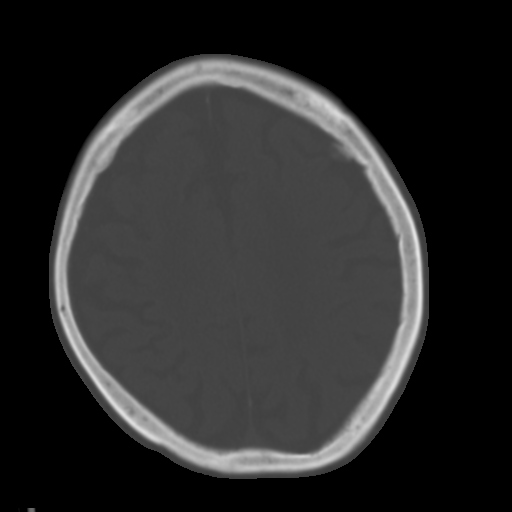
[im 25/33  brain]
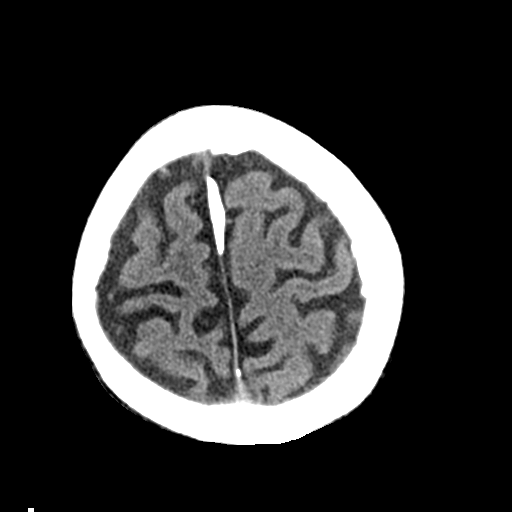
[im 29/33  brain]
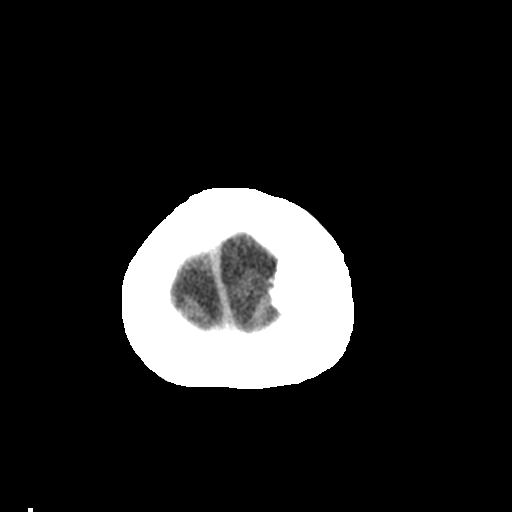

[Series 4: coronal soft · coronal · 0.32mm/px · 3 of 74 slices shown]
[im 25/74  brain]
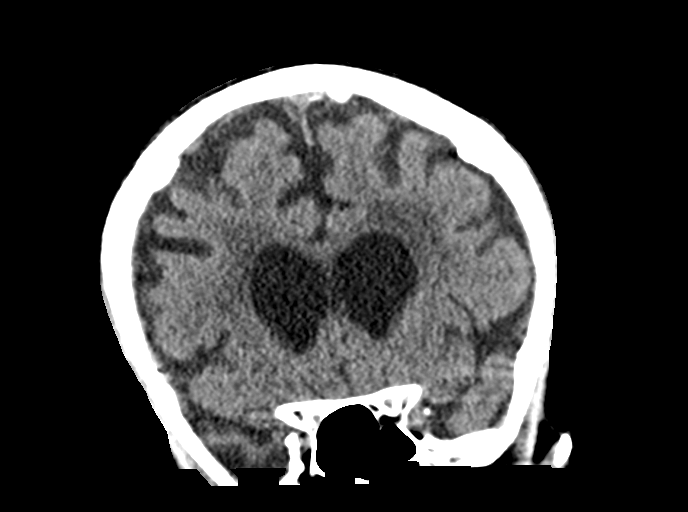
[im 33/74  brain]
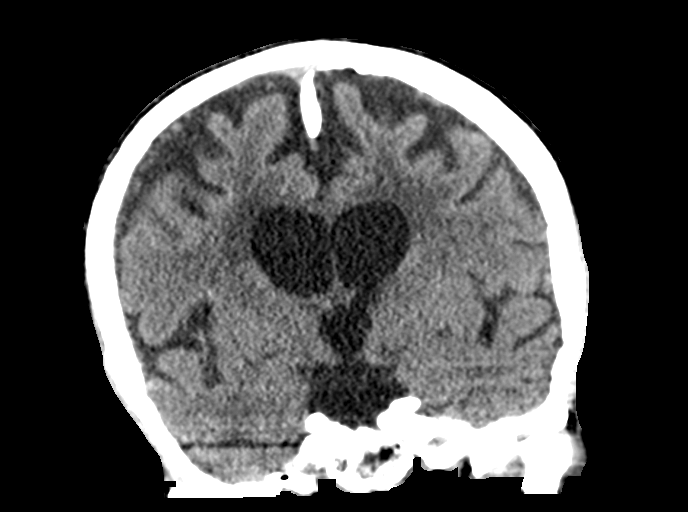
[im 41/74  brain]
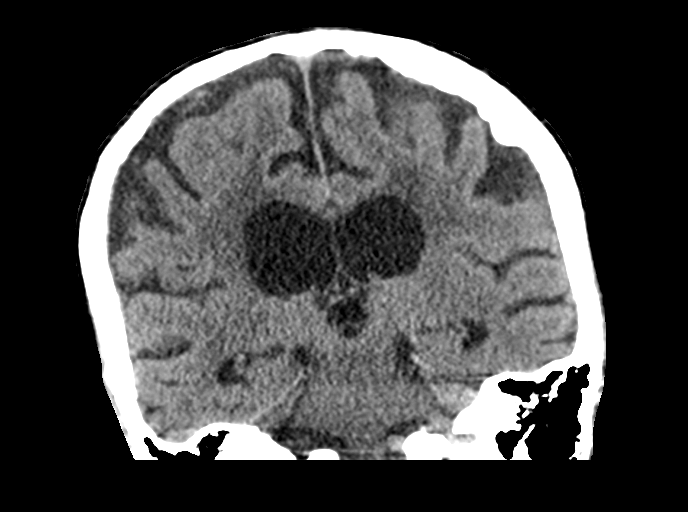

[Series 5: sagittal soft · sagittal · 0.32mm/px · 3 of 59 slices shown]
[im 20/59  brain]
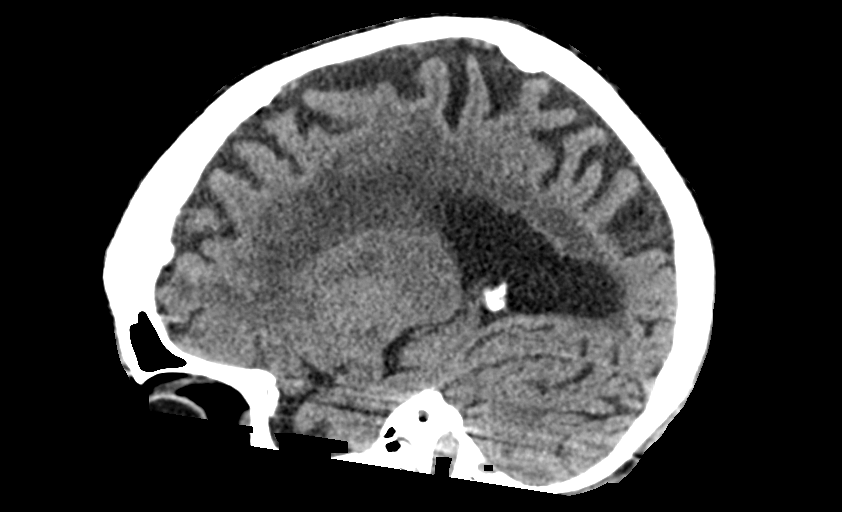
[im 30/59  brain]
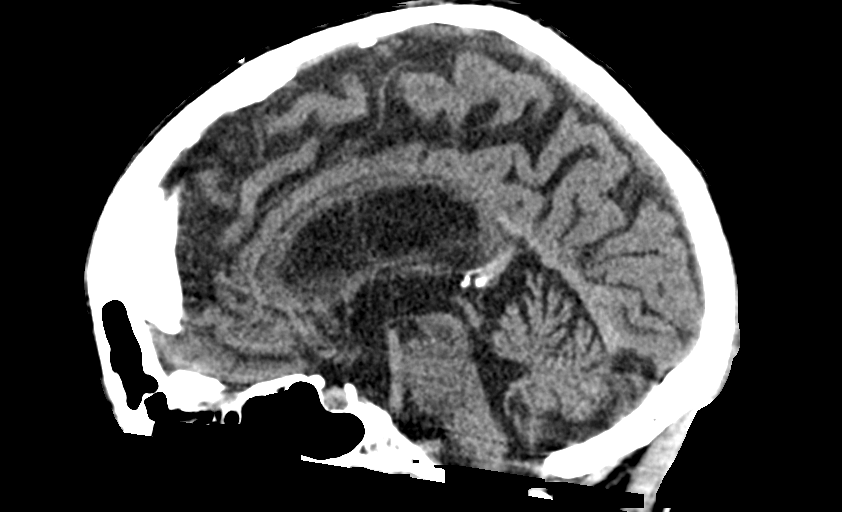
[im 39/59  brain]
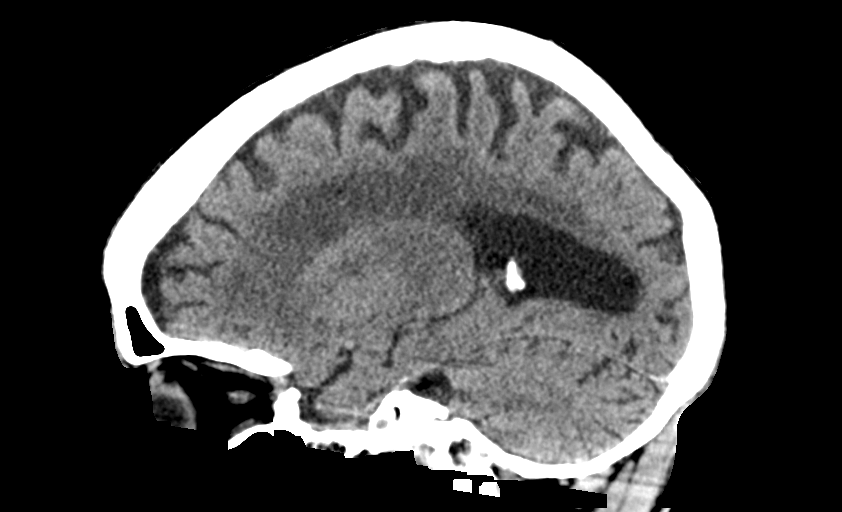

[13 of 47 positions shown; findings below may reference images not displayed]

FINDINGS: CT HEAD FINDINGS

Brain: No acute territorial infarction, hemorrhage or intracranial
mass. Atrophy and chronic small vessel ischemic changes of the white
matter. Small chronic appearing infarcts in the cerebellum. Mildly
prominent ventricles likely secondary to atrophy

Vascular: No hyperdense vessels.  Carotid vascular calcification

Skull: Normal. Negative for fracture or focal lesion.

Sinuses/Orbits: No acute finding.

Other: None

CT CERVICAL SPINE FINDINGS

Alignment: Trace retrolisthesis C3 on C4. Facet alignment within
normal limits

Skull base and vertebrae: No acute fracture. No primary bone lesion
or focal pathologic process.

Soft tissues and spinal canal: No prevertebral fluid or swelling. No
visible canal hematoma.

Disc levels: Advanced disc space narrowing and degenerative change
C3-C4. Moderate disc space narrowing and degenerative change C5-C6
and C6-C7. Facet degenerative changes at multiple levels with
foraminal narrowing

Upper chest: Mild fibrosis and tree-in-bud nodularity at the apices,
see separately dictated chest CT.

Other: None
IMPRESSION: 1. No CT evidence for acute intracranial abnormality. Atrophy and
chronic small vessel ischemic changes of the white matter
2. Straightening of the cervical spine with degenerative changes. No
fracture

## 2024-02-11 IMAGING — CT CT CERVICAL SPINE W/O CM
3 series · 13 of 35 positions shown, 16 images · non-contrast
Comparison: CT brain and cervical spine 06/08/2012

CLINICAL DATA: Hit back of head



[Series 4: c spine soft · axial · 0.35mm/px · z∈[-138,-28]mm · 5 of 80 slices shown, 7 images]
[im 13/80  soft-tissue]
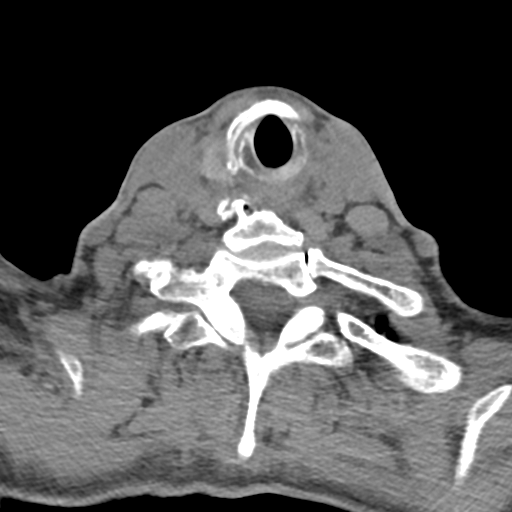
[im 13/80  bone]
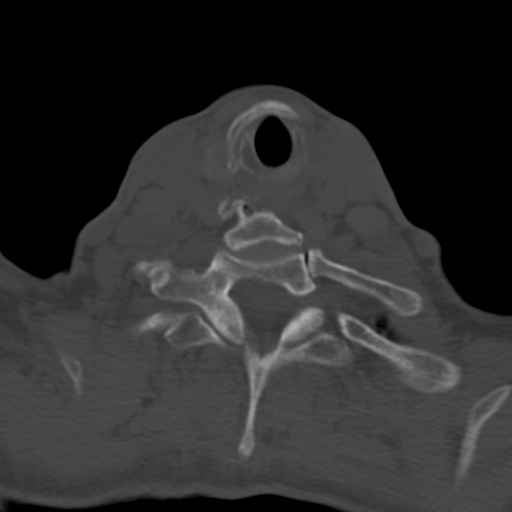
[im 25/80  bone]
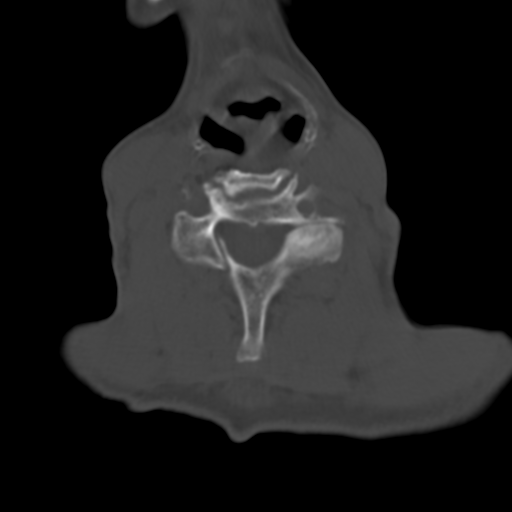
[im 43/80  bone]
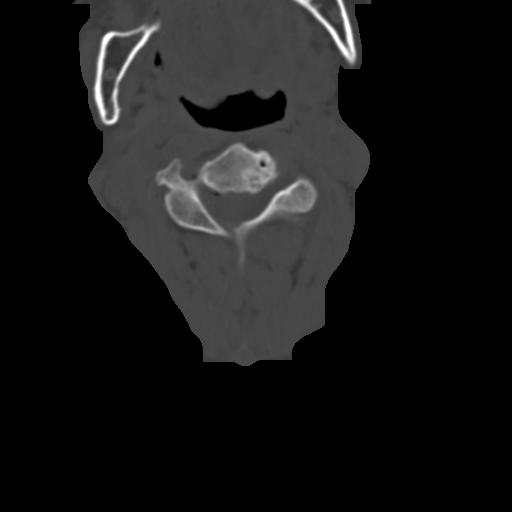
[im 55/80  bone]
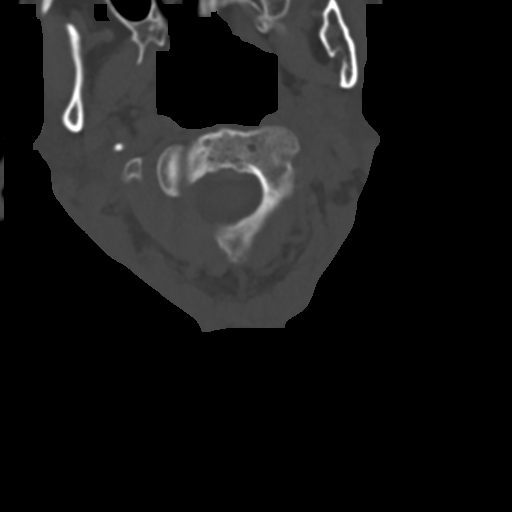
[im 67/80  soft-tissue]
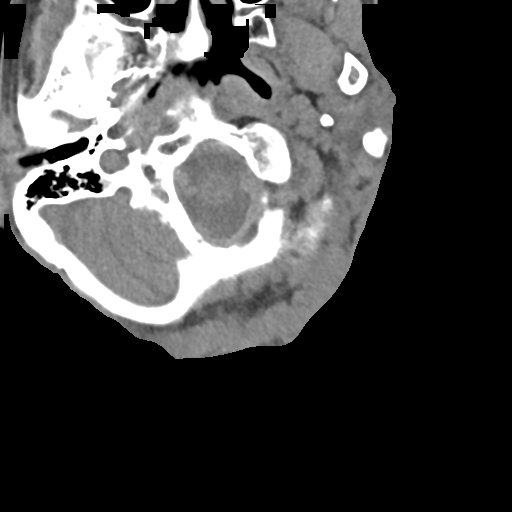
[im 67/80  bone]
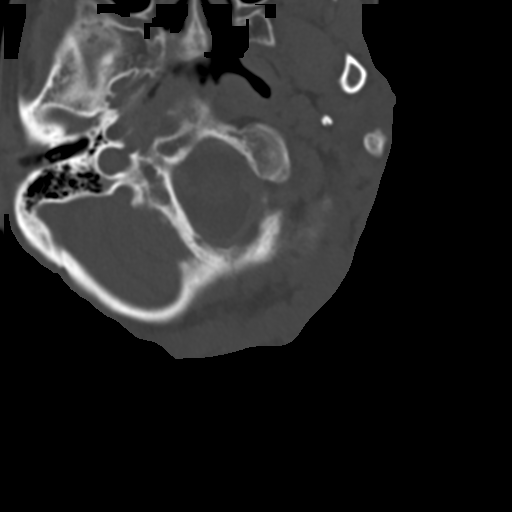

[Series 5: sagittal bone · sagittal · 0.24mm/px · 5 of 61 slices shown, 6 images]
[im 21/61  bone]
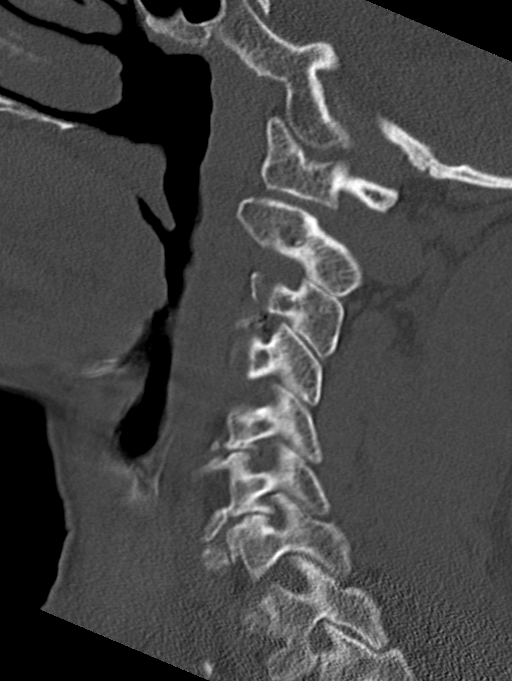
[im 26/61  bone]
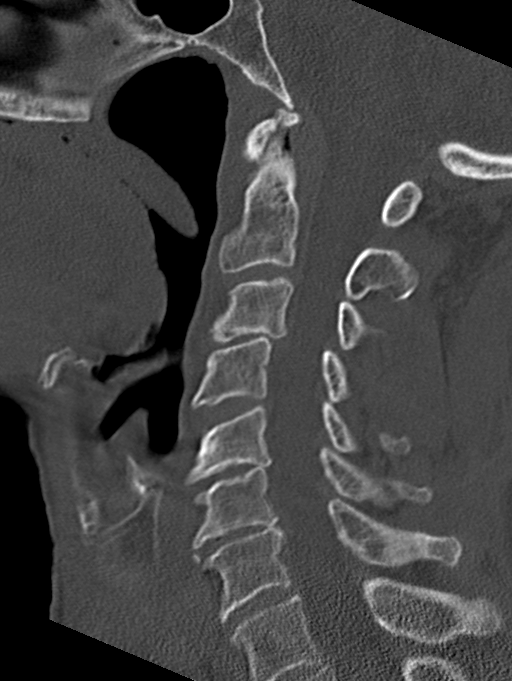
[im 31/61  soft-tissue]
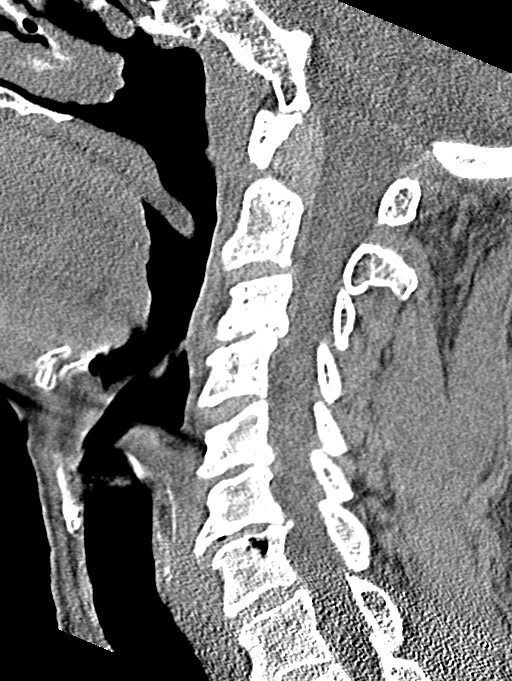
[im 31/61  bone]
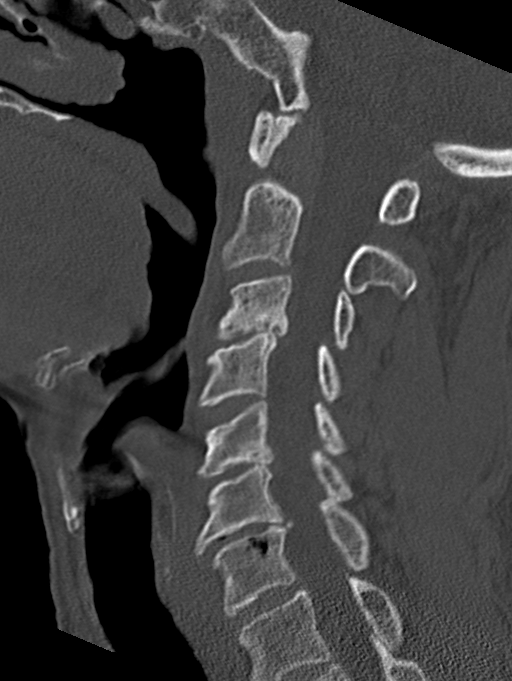
[im 36/61  bone]
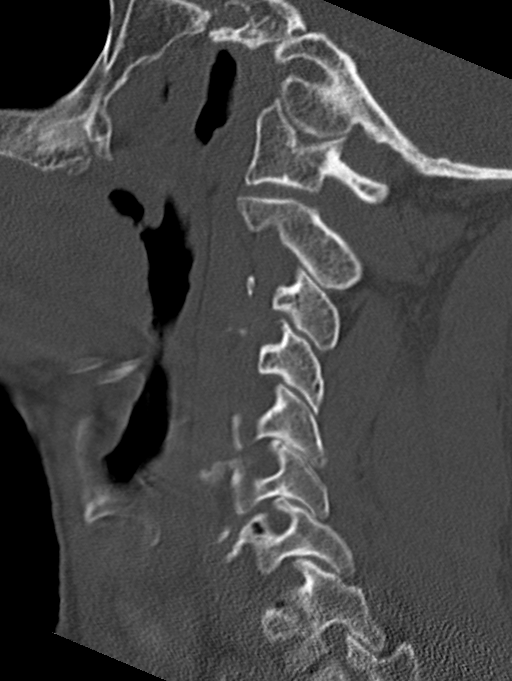
[im 41/61  bone]
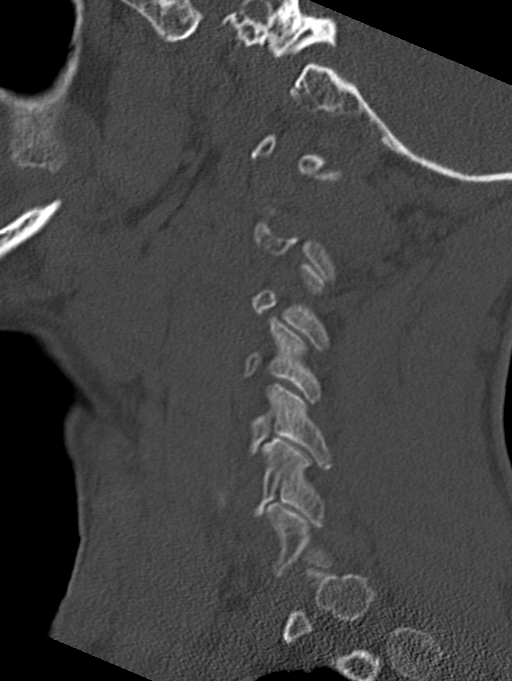

[Series 6: coronal bone · coronal · 0.24mm/px · 3 of 61 slices shown]
[im 13/61  bone]
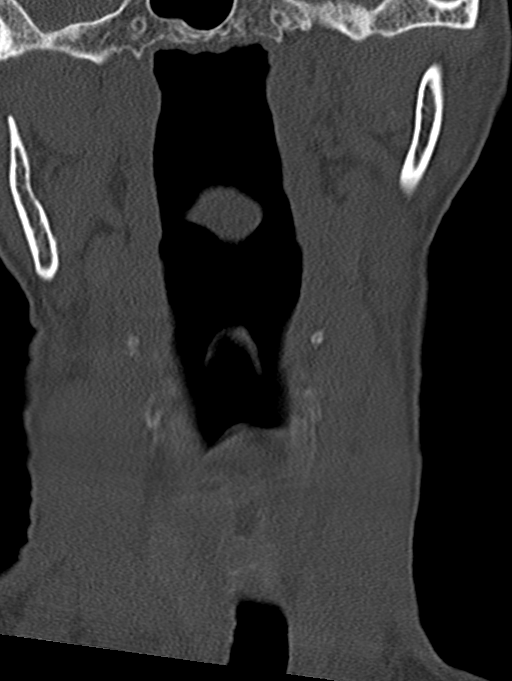
[im 25/61  bone]
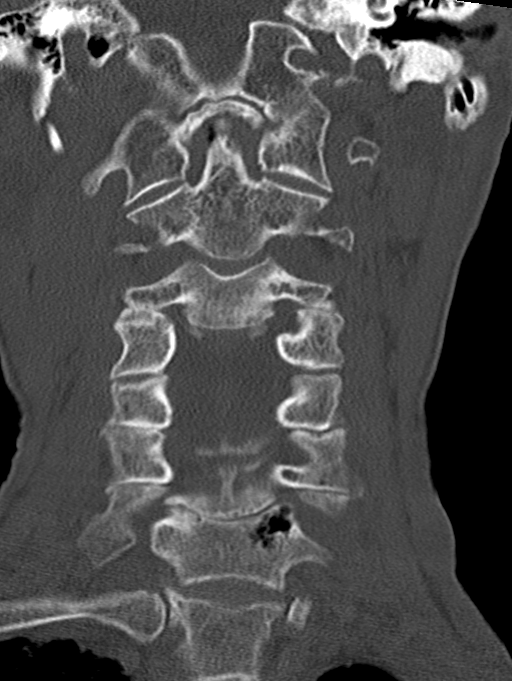
[im 37/61  bone]
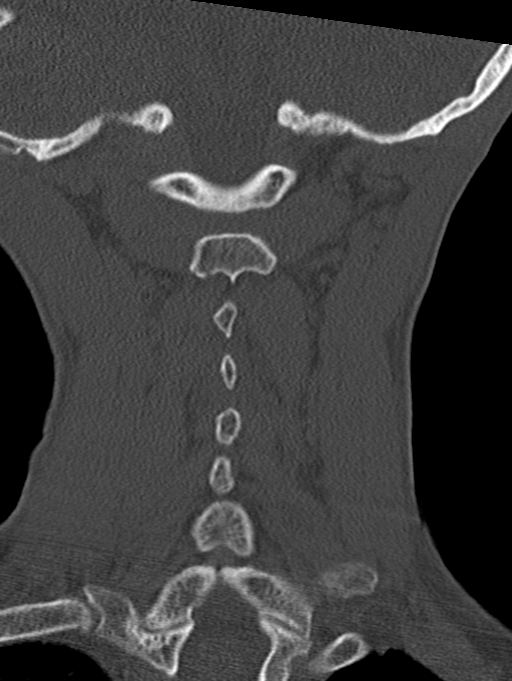

[13 of 35 positions shown; findings below may reference images not displayed]

FINDINGS: CT HEAD FINDINGS

Brain: No acute territorial infarction, hemorrhage or intracranial
mass. Atrophy and chronic small vessel ischemic changes of the white
matter. Small chronic appearing infarcts in the cerebellum. Mildly
prominent ventricles likely secondary to atrophy

Vascular: No hyperdense vessels.  Carotid vascular calcification

Skull: Normal. Negative for fracture or focal lesion.

Sinuses/Orbits: No acute finding.

Other: None

CT CERVICAL SPINE FINDINGS

Alignment: Trace retrolisthesis C3 on C4. Facet alignment within
normal limits

Skull base and vertebrae: No acute fracture. No primary bone lesion
or focal pathologic process.

Soft tissues and spinal canal: No prevertebral fluid or swelling. No
visible canal hematoma.

Disc levels: Advanced disc space narrowing and degenerative change
C3-C4. Moderate disc space narrowing and degenerative change C5-C6
and C6-C7. Facet degenerative changes at multiple levels with
foraminal narrowing

Upper chest: Mild fibrosis and tree-in-bud nodularity at the apices,
see separately dictated chest CT.

Other: None
IMPRESSION: 1. No CT evidence for acute intracranial abnormality. Atrophy and
chronic small vessel ischemic changes of the white matter
2. Straightening of the cervical spine with degenerative changes. No
fracture
